# Patient Record
Sex: Female | Born: 1990
Health system: Southern US, Community
[De-identification: ages and names within clinical notes are randomized; demographics above are authoritative.]

## PROBLEM LIST (undated history)

## (undated) DIAGNOSIS — I10 Essential (primary) hypertension: Secondary | ICD-10-CM

## (undated) DIAGNOSIS — K3184 Gastroparesis: Secondary | ICD-10-CM

## (undated) DIAGNOSIS — E1143 Type 2 diabetes mellitus with diabetic autonomic (poly)neuropathy: Secondary | ICD-10-CM

## (undated) HISTORY — DX: Essential (primary) hypertension: I10

---
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
## 2010-05-01 IMAGING — CR DG FINGER INDEX 2+V*L*
1 series · 1 of 1 positions shown · non-contrast
Comparison: None.

CLINICAL DATA: Crush injury to index finger.  Pain and swelling.

LEFT INDEX FINGER 2+V

[view not recorded]
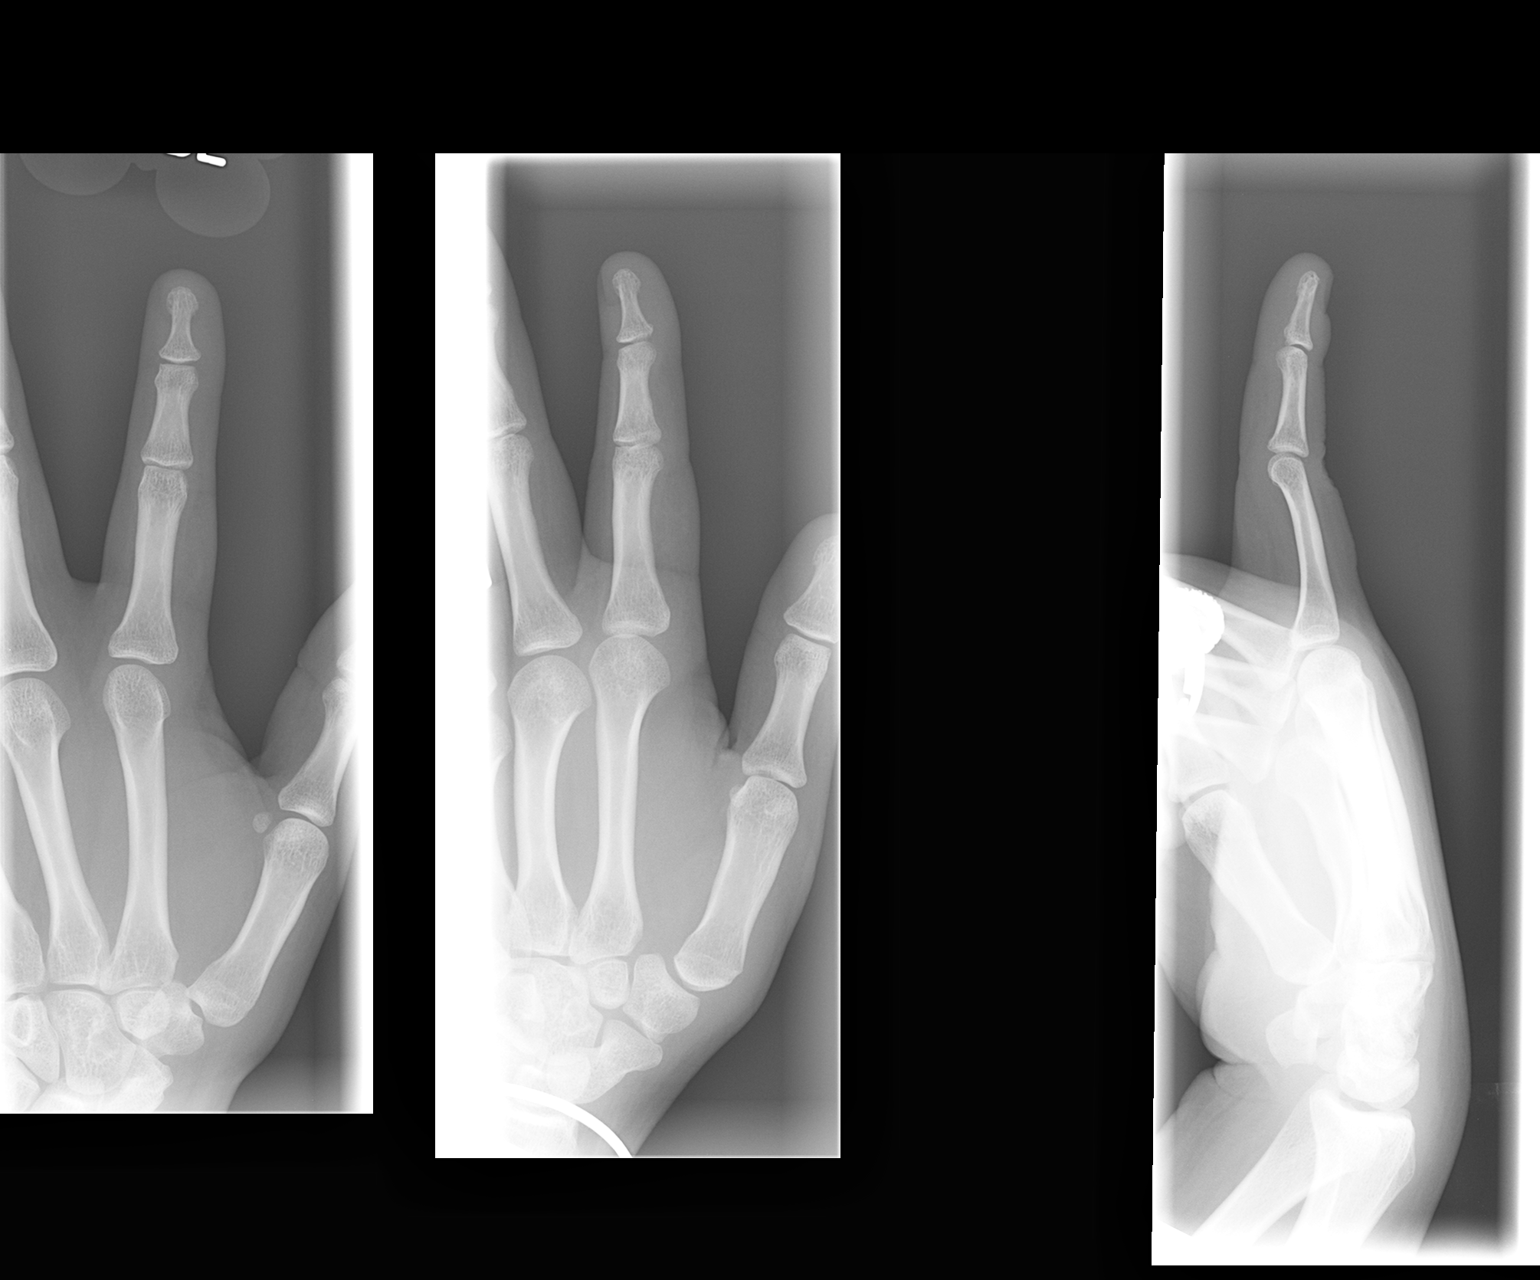

[1 of 1 positions shown; findings below may reference images not displayed]

FINDINGS: There is no evidence of fracture or dislocation.  There
is no evidence of arthropathy or other focal bone abnormality.
Soft tissues are unremarkable.
IMPRESSION: Negative.

## 2010-05-18 NOTE — Progress Notes (Signed)
Summary: phnmsg  Phone Note Call from Patient Call back at Home Phone [phone]   Caller: Patient Summary of Call: pt called to cancel appt for HFU and did not want to reschedule Initial call taken by: De Nurse,  October 14, 2009 1:42 PM

## 2010-07-02 LAB — POCT I-STAT 3, ART BLOOD GAS (G3+)
Acid-base deficit: 5 mmol/L — ABNORMAL HIGH (ref 0.0–2.0)
Acid-base deficit: 20 mmol/L — ABNORMAL HIGH (ref 0.0–2.0)
Bicarbonate: 19.3 mEq/L — ABNORMAL LOW (ref 20.0–24.0)
Bicarbonate: 7.6 mEq/L — ABNORMAL LOW (ref 20.0–24.0)
O2 Saturation: 98 %
O2 Saturation: 49 %
TCO2: 20 mmol/L (ref 0–100)
TCO2: 8 mmol/L (ref 0–100)
pCO2 arterial: 22.8 mmHg — ABNORMAL LOW (ref 35.0–45.0)
pH, Arterial: 7.386 (ref 7.350–7.400)
pO2, Arterial: 34 mmHg — CL (ref 80.0–100.0)

## 2010-07-02 LAB — BASIC METABOLIC PANEL
BUN: 11 mg/dL (ref 6–23)
BUN: 7 mg/dL (ref 6–23)
BUN: 7 mg/dL (ref 6–23)
BUN: 7 mg/dL (ref 6–23)
BUN: 8 mg/dL (ref 6–23)
BUN: 9 mg/dL (ref 6–23)
BUN: 3 mg/dL — ABNORMAL LOW (ref 6–23)
BUN: 4 mg/dL — ABNORMAL LOW (ref 6–23)
BUN: 4 mg/dL — ABNORMAL LOW (ref 6–23)
BUN: 6 mg/dL (ref 6–23)
CO2: 12 mEq/L — ABNORMAL LOW (ref 19–32)
CO2: 20 mEq/L (ref 19–32)
CO2: 22 mEq/L (ref 19–32)
CO2: 22 mEq/L (ref 19–32)
CO2: 13 mEq/L — ABNORMAL LOW (ref 19–32)
CO2: 14 mEq/L — ABNORMAL LOW (ref 19–32)
CO2: 20 mEq/L (ref 19–32)
Calcium: 10.3 mg/dL (ref 8.4–10.5)
Calcium: 10.3 mg/dL (ref 8.4–10.5)
Calcium: 10.4 mg/dL (ref 8.4–10.5)
Calcium: 8.7 mg/dL (ref 8.4–10.5)
Calcium: 9.4 mg/dL (ref 8.4–10.5)
Chloride: 100 mEq/L (ref 96–112)
Chloride: 100 mEq/L (ref 96–112)
Chloride: 102 mEq/L (ref 96–112)
Chloride: 106 mEq/L (ref 96–112)
Chloride: 102 mEq/L (ref 96–112)
Chloride: 104 mEq/L (ref 96–112)
Chloride: 111 mEq/L (ref 96–112)
Chloride: 111 mEq/L (ref 96–112)
Creatinine, Ser: 0.45 mg/dL (ref 0.4–1.2)
Creatinine, Ser: 0.5 mg/dL (ref 0.4–1.2)
Creatinine, Ser: 0.52 mg/dL (ref 0.4–1.2)
Creatinine, Ser: 0.81 mg/dL (ref 0.4–1.2)
Creatinine, Ser: 0.92 mg/dL (ref 0.4–1.2)
Creatinine, Ser: 1.11 mg/dL (ref 0.4–1.2)
Creatinine, Ser: 1.21 mg/dL — ABNORMAL HIGH (ref 0.4–1.2)
Creatinine, Ser: 0.51 mg/dL (ref 0.4–1.2)
Creatinine, Ser: 0.63 mg/dL (ref 0.4–1.2)
Creatinine, Ser: 0.73 mg/dL (ref 0.4–1.2)
Creatinine, Ser: 0.87 mg/dL (ref 0.4–1.2)
GFR calc Af Amer: >60 mL/min (ref >60)
GFR calc Af Amer: >60 mL/min (ref >60)
GFR calc Af Amer: >60 mL/min (ref >60)
GFR calc Af Amer: >60 mL/min (ref >60)
GFR calc Af Amer: >60 mL/min (ref >60)
GFR calc Af Amer: >60 mL/min (ref >60)
GFR calc Af Amer: >60 mL/min (ref >60)
GFR calc Af Amer: >60 mL/min (ref >60)
GFR calc Af Amer: >60 mL/min (ref >60)
GFR calc non Af Amer: >60 mL/min (ref >60)
GFR calc non Af Amer: >60 mL/min (ref >60)
GFR calc non Af Amer: >60 mL/min (ref >60)
GFR calc non Af Amer: >60 mL/min (ref >60)
GFR calc non Af Amer: >60 mL/min (ref >60)
GFR calc non Af Amer: >60 mL/min (ref >60)
GFR calc non Af Amer: 52 mL/min — ABNORMAL LOW (ref >60)
GFR calc non Af Amer: >60 mL/min (ref >60)
Glucose, Bld: 153 mg/dL — ABNORMAL HIGH (ref 70–99)
Glucose, Bld: 177 mg/dL — ABNORMAL HIGH (ref 70–99)
Glucose, Bld: 196 mg/dL — ABNORMAL HIGH (ref 70–99)
Glucose, Bld: 255 mg/dL — ABNORMAL HIGH (ref 70–99)
Glucose, Bld: 322 mg/dL — ABNORMAL HIGH (ref 70–99)
Glucose, Bld: 365 mg/dL — ABNORMAL HIGH (ref 70–99)
Glucose, Bld: 372 mg/dL — ABNORMAL HIGH (ref 70–99)
Glucose, Bld: 406 mg/dL — ABNORMAL HIGH (ref 70–99)
Glucose, Bld: 505 mg/dL — ABNORMAL HIGH (ref 70–99)
Glucose, Bld: 198 mg/dL — ABNORMAL HIGH (ref 70–99)
Glucose, Bld: 235 mg/dL — ABNORMAL HIGH (ref 70–99)
Glucose, Bld: 257 mg/dL — ABNORMAL HIGH (ref 70–99)
Glucose, Bld: 73 mg/dL (ref 70–99)
Potassium: 3.3 mEq/L — ABNORMAL LOW (ref 3.5–5.1)
Potassium: 3.9 mEq/L (ref 3.5–5.1)
Potassium: 4.1 mEq/L (ref 3.5–5.1)
Potassium: 4.8 mEq/L (ref 3.5–5.1)
Potassium: 4.7 mEq/L (ref 3.5–5.1)
Sodium: 129 mEq/L — ABNORMAL LOW (ref 135–145)
Sodium: 130 mEq/L — ABNORMAL LOW (ref 135–145)
Sodium: 131 mEq/L — ABNORMAL LOW (ref 135–145)
Sodium: 132 mEq/L — ABNORMAL LOW (ref 135–145)
Sodium: 134 mEq/L — ABNORMAL LOW (ref 135–145)
Sodium: 132 mEq/L — ABNORMAL LOW (ref 135–145)

## 2010-07-02 LAB — POCT I-STAT EG7
Acid-base deficit: 13 mmol/L — ABNORMAL HIGH (ref 0.0–2.0)
Acid-base deficit: 2 mmol/L (ref 0.0–2.0)
Bicarbonate: 12.7 mEq/L — ABNORMAL LOW (ref 20.0–24.0)
Bicarbonate: 21.2 mEq/L (ref 20.0–24.0)
HCT: 49 % — ABNORMAL HIGH (ref 36.0–46.0)
Hemoglobin: 12.6 g/dL (ref 12.0–15.0)
Hemoglobin: 12.6 g/dL (ref 12.0–15.0)
O2 Saturation: 34 %
O2 Saturation: 54 %
O2 Saturation: 95 %
Patient temperature: 36.6
Potassium: 3.3 mEq/L — ABNORMAL LOW (ref 3.5–5.1)
Potassium: 3.5 mEq/L (ref 3.5–5.1)
Sodium: 134 mEq/L — ABNORMAL LOW (ref 135–145)
Sodium: 136 mEq/L (ref 135–145)
Sodium: 136 mEq/L (ref 135–145)
TCO2: 14 mmol/L (ref 0–100)
TCO2: 17 mmol/L (ref 0–100)
TCO2: 21 mmol/L (ref 0–100)
TCO2: 22 mmol/L (ref 0–100)
pCO2, Ven: 30 mmHg — ABNORMAL LOW (ref 45.0–50.0)
pH, Ven: 7.455 — ABNORMAL HIGH (ref 7.250–7.300)
pH, Ven: 7.486 — ABNORMAL HIGH (ref 7.250–7.300)
pO2, Ven: 24 mmHg — CL (ref 30.0–45.0)
pO2, Ven: 68 mmHg — ABNORMAL HIGH (ref 30.0–45.0)

## 2010-07-02 LAB — BLOOD GAS, ARTERIAL
Acid-base deficit: 2.8 mmol/L — ABNORMAL HIGH (ref 0.0–2.0)
FIO2: 0.21 %
O2 Saturation: 98.4 %
Patient temperature: 98.6
pCO2 arterial: 34.8 mmHg — ABNORMAL LOW (ref 35.0–45.0)

## 2010-07-02 LAB — GLUCOSE, CAPILLARY
Glucose-Capillary: 142 mg/dL — ABNORMAL HIGH (ref 70–99)
Glucose-Capillary: 145 mg/dL — ABNORMAL HIGH (ref 70–99)
Glucose-Capillary: 171 mg/dL — ABNORMAL HIGH (ref 70–99)
Glucose-Capillary: 191 mg/dL — ABNORMAL HIGH (ref 70–99)
Glucose-Capillary: 219 mg/dL — ABNORMAL HIGH (ref 70–99)
Glucose-Capillary: 223 mg/dL — ABNORMAL HIGH (ref 70–99)
Glucose-Capillary: 234 mg/dL — ABNORMAL HIGH (ref 70–99)
Glucose-Capillary: 236 mg/dL — ABNORMAL HIGH (ref 70–99)
Glucose-Capillary: 236 mg/dL — ABNORMAL HIGH (ref 70–99)
Glucose-Capillary: 244 mg/dL — ABNORMAL HIGH (ref 70–99)
Glucose-Capillary: 255 mg/dL — ABNORMAL HIGH (ref 70–99)
Glucose-Capillary: 259 mg/dL — ABNORMAL HIGH (ref 70–99)
Glucose-Capillary: 279 mg/dL — ABNORMAL HIGH (ref 70–99)
Glucose-Capillary: 279 mg/dL — ABNORMAL HIGH (ref 70–99)
Glucose-Capillary: 288 mg/dL — ABNORMAL HIGH (ref 70–99)
Glucose-Capillary: 337 mg/dL — ABNORMAL HIGH (ref 70–99)
Glucose-Capillary: 348 mg/dL — ABNORMAL HIGH (ref 70–99)
Glucose-Capillary: 468 mg/dL — ABNORMAL HIGH (ref 70–99)
Glucose-Capillary: 545 mg/dL — ABNORMAL HIGH (ref 70–99)
Glucose-Capillary: >600 mg/dL (ref 70–99)
Glucose-Capillary: 104 mg/dL — ABNORMAL HIGH (ref 70–99)
Glucose-Capillary: 108 mg/dL — ABNORMAL HIGH (ref 70–99)
Glucose-Capillary: 109 mg/dL — ABNORMAL HIGH (ref 70–99)
Glucose-Capillary: 126 mg/dL — ABNORMAL HIGH (ref 70–99)
Glucose-Capillary: 132 mg/dL — ABNORMAL HIGH (ref 70–99)
Glucose-Capillary: 157 mg/dL — ABNORMAL HIGH (ref 70–99)
Glucose-Capillary: 169 mg/dL — ABNORMAL HIGH (ref 70–99)
Glucose-Capillary: 180 mg/dL — ABNORMAL HIGH (ref 70–99)
Glucose-Capillary: 196 mg/dL — ABNORMAL HIGH (ref 70–99)
Glucose-Capillary: 197 mg/dL — ABNORMAL HIGH (ref 70–99)
Glucose-Capillary: 198 mg/dL — ABNORMAL HIGH (ref 70–99)
Glucose-Capillary: 212 mg/dL — ABNORMAL HIGH (ref 70–99)
Glucose-Capillary: 212 mg/dL — ABNORMAL HIGH (ref 70–99)
Glucose-Capillary: 228 mg/dL — ABNORMAL HIGH (ref 70–99)
Glucose-Capillary: 242 mg/dL — ABNORMAL HIGH (ref 70–99)
Glucose-Capillary: 251 mg/dL — ABNORMAL HIGH (ref 70–99)
Glucose-Capillary: 279 mg/dL — ABNORMAL HIGH (ref 70–99)
Glucose-Capillary: 62 mg/dL — ABNORMAL LOW (ref 70–99)
Glucose-Capillary: 87 mg/dL (ref 70–99)
Glucose-Capillary: 87 mg/dL (ref 70–99)
Glucose-Capillary: 91 mg/dL (ref 70–99)

## 2010-07-02 LAB — URINE MICROSCOPIC-ADD ON

## 2010-07-02 LAB — DIFFERENTIAL
Basophils Absolute: 0 10*3/uL (ref 0.0–0.1)
Basophils Relative: 0 % (ref 0–1)
Eosinophils Absolute: 0 10*3/uL (ref 0.0–0.7)
Eosinophils Relative: 0 % (ref 0–5)
Lymphocytes Relative: 20 % (ref 12–46)
Lymphocytes Relative: 9 % — ABNORMAL LOW (ref 12–46)
Lymphocytes Relative: 9 % — ABNORMAL LOW (ref 12–46)
Lymphs Abs: 2 10*3/uL (ref 0.7–4.0)
Lymphs Abs: 2.2 10*3/uL (ref 0.7–4.0)
Lymphs Abs: 1.8 10*3/uL (ref 0.7–4.0)
Monocytes Relative: 5 % (ref 3–12)
Monocytes Relative: 2 % — ABNORMAL LOW (ref 3–12)
Neutro Abs: 8.8 10*3/uL — ABNORMAL HIGH (ref 1.7–7.7)
Neutrophils Relative %: 73 % (ref 43–77)
Neutrophils Relative %: 77 % (ref 43–77)

## 2010-07-02 LAB — POCT I-STAT, CHEM 8
BUN: 29 mg/dL — ABNORMAL HIGH (ref 6–23)
Calcium, Ion: 1.2 mmol/L (ref 1.12–1.32)
Glucose, Bld: 690 mg/dL (ref 70–99)
Hemoglobin: 17.3 g/dL — ABNORMAL HIGH (ref 12.0–15.0)
TCO2: 7 mmol/L (ref 0–100)

## 2010-07-02 LAB — CBC
HCT: 46.1 % — ABNORMAL HIGH (ref 36.0–46.0)
HCT: 48.7 % — ABNORMAL HIGH (ref 36.0–46.0)
Hemoglobin: 15.1 g/dL — ABNORMAL HIGH (ref 12.0–15.0)
MCHC: 32.7 g/dL (ref 30.0–36.0)
MCHC: 33.9 g/dL (ref 30.0–36.0)
MCV: 96 fL (ref 78.0–100.0)
MCV: 93.4 fL (ref 78.0–100.0)
Platelets: 362 10*3/uL (ref 150–400)
Platelets: 379 10*3/uL (ref 150–400)
RBC: 4.42 MIL/uL (ref 3.87–5.11)
RBC: 4.81 MIL/uL (ref 3.87–5.11)
RBC: 5.21 MIL/uL — ABNORMAL HIGH (ref 3.87–5.11)
RDW: 15.2 % (ref 11.5–15.5)
RDW: 15.7 % — ABNORMAL HIGH (ref 11.5–15.5)
WBC: 9.9 10*3/uL (ref 4.0–10.5)
WBC: 19 10*3/uL — ABNORMAL HIGH (ref 4.0–10.5)

## 2010-07-02 LAB — MAGNESIUM: Magnesium: 2.3 mg/dL (ref 1.5–2.5)

## 2010-07-02 LAB — URINALYSIS, ROUTINE W REFLEX MICROSCOPIC
Bilirubin Urine: NEGATIVE
Glucose, UA: >1000 mg/dL — AB
Ketones, ur: 40 mg/dL — AB
Nitrite: NEGATIVE
Specific Gravity, Urine: 1.029 (ref 1.005–1.030)
Specific Gravity, Urine: 1.024 (ref 1.005–1.030)
Urobilinogen, UA: 0.2 mg/dL (ref 0.0–1.0)
pH: 5 (ref 5.0–8.0)

## 2010-07-02 LAB — MICROALBUMIN, URINE: Microalb, Ur: 0.5 mg/dL (ref 0.00–1.89)

## 2010-07-02 LAB — HEMOGLOBIN A1C: Mean Plasma Glucose: 312 mg/dL — ABNORMAL HIGH (ref <117)

## 2010-07-02 LAB — KETONES, URINE
Ketones, ur: NEGATIVE mg/dL
Ketones, ur: NEGATIVE mg/dL

## 2010-07-02 LAB — POCT PREGNANCY, URINE
Preg Test, Ur: NEGATIVE
Preg Test, Ur: NEGATIVE

## 2010-07-02 LAB — POCT I-STAT 3, VENOUS BLOOD GAS (G3P V)
Bicarbonate: 8.4 mEq/L — ABNORMAL LOW (ref 20.0–24.0)
pO2, Ven: 42 mmHg (ref 30.0–45.0)

## 2010-07-02 LAB — KETONES, QUALITATIVE

## 2010-07-03 LAB — GLUCOSE, CAPILLARY
Glucose-Capillary: 121 mg/dL — ABNORMAL HIGH (ref 70–99)
Glucose-Capillary: 123 mg/dL — ABNORMAL HIGH (ref 70–99)
Glucose-Capillary: 164 mg/dL — ABNORMAL HIGH (ref 70–99)
Glucose-Capillary: 185 mg/dL — ABNORMAL HIGH (ref 70–99)
Glucose-Capillary: 220 mg/dL — ABNORMAL HIGH (ref 70–99)
Glucose-Capillary: 247 mg/dL — ABNORMAL HIGH (ref 70–99)
Glucose-Capillary: 271 mg/dL — ABNORMAL HIGH (ref 70–99)
Glucose-Capillary: 93 mg/dL (ref 70–99)
Glucose-Capillary: >600 mg/dL (ref 70–99)

## 2010-07-03 LAB — DIFFERENTIAL
Basophils Relative: 0 % (ref 0–1)
Eosinophils Absolute: 0.2 10*3/uL (ref 0.0–0.7)
Eosinophils Relative: 4 % (ref 0–5)
Lymphs Abs: 1.4 10*3/uL (ref 0.7–4.0)
Lymphs Abs: 2.7 10*3/uL (ref 0.7–4.0)
Monocytes Absolute: 0.4 10*3/uL (ref 0.1–1.0)
Monocytes Relative: 4 % (ref 3–12)
Monocytes Relative: 7 % (ref 3–12)
Neutro Abs: 9 10*3/uL — ABNORMAL HIGH (ref 1.7–7.7)
Neutrophils Relative %: 40 % — ABNORMAL LOW (ref 43–77)
Neutrophils Relative %: 83 % — ABNORMAL HIGH (ref 43–77)

## 2010-07-03 LAB — CBC
HCT: 39.3 % (ref 36.0–46.0)
Hemoglobin: 13 g/dL (ref 12.0–15.0)
Hemoglobin: 13.9 g/dL (ref 12.0–15.0)
MCHC: 32.3 g/dL (ref 30.0–36.0)
MCV: 91.8 fL (ref 78.0–100.0)
RBC: 4.28 MIL/uL (ref 3.87–5.11)
RBC: 4.57 MIL/uL (ref 3.87–5.11)
WBC: 10.9 10*3/uL — ABNORMAL HIGH (ref 4.0–10.5)
WBC: 5.5 10*3/uL (ref 4.0–10.5)

## 2010-07-03 LAB — LIPID PANEL
LDL Cholesterol: 129 mg/dL — ABNORMAL HIGH (ref 0–109)
Total CHOL/HDL Ratio: 3.2 RATIO
VLDL: 23 mg/dL (ref 0–40)

## 2010-07-03 LAB — BASIC METABOLIC PANEL
BUN: 5 mg/dL — ABNORMAL LOW (ref 6–23)
BUN: 9 mg/dL (ref 6–23)
CO2: 19 mEq/L (ref 19–32)
CO2: 20 mEq/L (ref 19–32)
CO2: 20 mEq/L (ref 19–32)
Calcium: 9 mg/dL (ref 8.4–10.5)
Calcium: 9.2 mg/dL (ref 8.4–10.5)
Chloride: 104 mEq/L (ref 96–112)
Chloride: 104 mEq/L (ref 96–112)
Chloride: 108 mEq/L (ref 96–112)
Chloride: 99 mEq/L (ref 96–112)
Creatinine, Ser: 0.56 mg/dL (ref 0.4–1.2)
Creatinine, Ser: 0.58 mg/dL (ref 0.4–1.2)
Creatinine, Ser: 0.67 mg/dL (ref 0.4–1.2)
Creatinine, Ser: 1.08 mg/dL (ref 0.4–1.2)
GFR calc Af Amer: >60 mL/min (ref >60)
GFR calc Af Amer: >60 mL/min (ref >60)
GFR calc Af Amer: >60 mL/min (ref >60)
GFR calc Af Amer: >60 mL/min (ref >60)
GFR calc non Af Amer: >60 mL/min (ref >60)
GFR calc non Af Amer: >60 mL/min (ref >60)
Glucose, Bld: 168 mg/dL — ABNORMAL HIGH (ref 70–99)
Glucose, Bld: 389 mg/dL — ABNORMAL HIGH (ref 70–99)
Potassium: 3.3 mEq/L — ABNORMAL LOW (ref 3.5–5.1)
Potassium: 3.7 mEq/L (ref 3.5–5.1)
Potassium: 4.6 mEq/L (ref 3.5–5.1)
Sodium: 133 mEq/L — ABNORMAL LOW (ref 135–145)
Sodium: 134 mEq/L — ABNORMAL LOW (ref 135–145)
Sodium: 135 mEq/L (ref 135–145)
Sodium: 140 mEq/L (ref 135–145)

## 2010-07-03 LAB — HEMOGLOBIN A1C: Mean Plasma Glucose: 301 mg/dL — ABNORMAL HIGH (ref <117)

## 2010-07-03 LAB — URINALYSIS, ROUTINE W REFLEX MICROSCOPIC
Bilirubin Urine: NEGATIVE
Glucose, UA: >1000 mg/dL — AB
Ketones, ur: >80 mg/dL — AB
Protein, ur: NEGATIVE mg/dL
Urobilinogen, UA: 0.2 mg/dL (ref 0.0–1.0)

## 2010-07-03 LAB — MRSA PCR SCREENING: MRSA by PCR: NEGATIVE

## 2010-07-03 LAB — URINE MICROSCOPIC-ADD ON

## 2010-07-19 LAB — DIFFERENTIAL
Basophils Absolute: 0.1 10*3/uL (ref 0.0–0.1)
Basophils Relative: 1 % (ref 0–1)
Neutro Abs: 6.4 10*3/uL (ref 1.7–8.0)
Neutrophils Relative %: 66 % (ref 43–71)

## 2010-07-19 LAB — COMPREHENSIVE METABOLIC PANEL
Alkaline Phosphatase: 118 U/L (ref 47–119)
BUN: 13 mg/dL (ref 6–23)
CO2: 19 mEq/L (ref 19–32)
Chloride: 100 mEq/L (ref 96–112)
Glucose, Bld: 366 mg/dL — ABNORMAL HIGH (ref 70–99)
Potassium: 3.9 mEq/L (ref 3.5–5.1)
Total Bilirubin: 0.9 mg/dL (ref 0.3–1.2)
Total Protein: 7.3 g/dL (ref 6.0–8.3)

## 2010-07-19 LAB — GLUCOSE, CAPILLARY: Glucose-Capillary: 308 mg/dL — ABNORMAL HIGH (ref 70–99)

## 2010-07-19 LAB — POCT I-STAT 3, VENOUS BLOOD GAS (G3P V)
Bicarbonate: 20.7 mEq/L (ref 20.0–24.0)
O2 Saturation: 95 %
TCO2: 22 mmol/L (ref 0–100)
pCO2, Ven: 32.8 mmHg — ABNORMAL LOW (ref 45.0–50.0)

## 2010-07-19 LAB — URINALYSIS, ROUTINE W REFLEX MICROSCOPIC
Nitrite: NEGATIVE
Protein, ur: NEGATIVE mg/dL
Specific Gravity, Urine: 1.026 (ref 1.005–1.030)
Urobilinogen, UA: 0.2 mg/dL (ref 0.0–1.0)

## 2010-07-19 LAB — CBC
HCT: 39 % (ref 36.0–49.0)
Hemoglobin: 13.3 g/dL (ref 12.0–16.0)
RDW: 13.6 % (ref 11.4–15.5)

## 2010-07-19 LAB — URINE MICROSCOPIC-ADD ON

## 2010-09-01 NOTE — Discharge Summary (Signed)
[name], [name]                         ACCOUNT NO.:  [id number]   MEDICAL RECORD NO.:  [id number]                   PATIENT TYPE:  INP   LOCATION:  6121                                 FACILITY:  [facility]   PHYSICIAN:  Nani Gasser, M.D.            DATE OF BIRTH:  07/05/1990   DATE OF ADMISSION:  07/01/2002  DATE OF DISCHARGE:  07/06/2002                                 DISCHARGE SUMMARY   DISCHARGE DIAGNOSES:  1. Diabetic ketoacidosis.  2. Diabetes mellitus, type 1.  3. Dehydration.  4. Constipation.  5. Asthma.   DISCHARGE MEDICATIONS:  1. Lantus 30 units at bedtime.  2. Sliding scale:  CBG 150 to 200 is 2 units, 200 to 250 is 4 units, 250 to     300 is 6 units, 300 to 350 is 8 units, 350 to 400 is 7 units and greater     than 400 is 12 units.  3. Albuterol -- continue as before.  4. MiraLax 17 g dissolved in 8 ounces of water p.o. daily p.r.n. for     constipation.   SPECIAL DISCHARGE INSTRUCTIONS:  She also has instructions to drink plenty  of fluids at home and increase the fiber in her diet.  She is also to eat a  snack after PE class at school to help with low CBGs and also special  instructions include if first morning glucose is less than 60, then she is  to supposed to decrease her evening Lantus to 28 units.   HOSPITAL COURSE:  The patient is an 20 year old African American female with  a history of diabetes mellitus, type 1, who came in with vomiting and  complaining of stomach pain and hyperglycemia.  The patient reported feeling  sick to her stomach  at school and started vomiting x4, which she describes  as bilious.  The patient came in and was admitted for diabetic ketoacidosis.  The following problems were addressed during admission:   #1 - DIABETIC KETOACIDOSIS:  The patient had an i-STAT showing a blood pH of  7.121, PCO2 of 28 and a bicarb of 9, a sodium of 137, a potassium of 5.5 and  a glucose of 450.  BUN was 12 and creatinine was 1.2.   The patient did not  have any other signs or symptoms of infection at that time, though we did do  a CBC with a white count of 15.7, a hemoglobin of 6.1, hematocrit of 47.4  and a platelet count of 309,000.  She also had a negative hCG and her UA  showed glucose and greater than 80 ketones but no signs of urinary tract  infection.  The patient also had a lipase which was normal.  The patient was  admitted to the TICU for IV fluid rehydration and placed on an insulin drip.  The patient did well overnight and then was restarted on subcu injection of  insulin and transferred  to the floor.  The patient continued to have  elevated CBGs, once back on her regular subcut regimen of 17 units of Lantus  and a less strict sliding scale, thus, these were readjusted multiple times,  with the final change as above in the medication list.  The patient was  still running 200 and 300, even after regimen changes.  It was felt that the  patient could deal with these CBGs as an outpatient and keep in close  contact with the endocrinologist at [facility].  The patient has been  instructed to keep her next followup appointment for managing her diabetes.   #2 - DIABETES MELLITUS:  The patient had had hemoglobin A1cs of 7 up until  this last November, when her regimen was changed in preparation for possibly  getting an insulin pump.  Since that time, mom reports difficulty with  glucose control.  On admission, hemoglobin A1c was 13.4.  The patient  appears to be fairly compliant.  She also has a good understanding of carb-  counting and she and her mother report not missing any insulin doses.  The  patient will continue to need adjustments, as she is currently entering  puberty and may have higher requirements.  The patient is currently using a  sliding scale with insulin and the patient covers 10 g of carbohydrates with  1 unit of insulin; she is also on Lantus at night.   #3 - DEHYDRATION:  The patient was  rehydrated with IV fluids.   #4 - ASTHMA:  This is a chronic problem and the patient is on albuterol  p.r.n. and had no complications during admission.   #5 - CONSTIPATION:  The patient was started on a regimen of MiraLax and  Senokot here until she had several bowel movements.  The patient will be  given a prescription for MiraLax to use at home for intermittent  constipation.                                               Nani Gasser, M.D.    CM/MEDQ  D:  07/06/2002  T:  07/07/2002  Job:  [id number]   cc:   Lennie Hummer, Kendell Bane, [place] Dr. Epimenio Sarin, [place] Dr. Caron Presume

## 2011-04-29 ENCOUNTER — Encounter (HOSPITAL_COMMUNITY): Payer: Self-pay

## 2011-04-29 ENCOUNTER — Inpatient Hospital Stay (HOSPITAL_COMMUNITY)
Admission: EM | Admit: 2011-04-29 | Discharge: 2011-05-01 | DRG: 639 | Disposition: A | Discharge status: Home / Self Care | Payer: Medicaid Other | Attending: Internal Medicine | Admitting: Internal Medicine

## 2011-04-29 ENCOUNTER — Emergency Department (INDEPENDENT_AMBULATORY_CARE_PROVIDER_SITE_OTHER)
Admission: EM | Admit: 2011-04-29 | Discharge: 2011-04-29 | Disposition: A | Discharge status: Other Acute Inpatient Hospital | Payer: Self-pay | Source: Home / Self Care | Attending: Emergency Medicine | Admitting: Emergency Medicine

## 2011-04-29 ENCOUNTER — Emergency Department (HOSPITAL_COMMUNITY): Payer: Medicaid Other

## 2011-04-29 DIAGNOSIS — E1110 Type 2 diabetes mellitus with ketoacidosis without coma: Secondary | ICD-10-CM

## 2011-04-29 DIAGNOSIS — Z91199 Patient's noncompliance with other medical treatment and regimen due to unspecified reason: Secondary | ICD-10-CM

## 2011-04-29 DIAGNOSIS — Z9641 Presence of insulin pump (external) (internal): Secondary | ICD-10-CM

## 2011-04-29 DIAGNOSIS — J45909 Unspecified asthma, uncomplicated: Secondary | ICD-10-CM | POA: Diagnosis present

## 2011-04-29 DIAGNOSIS — Z23 Encounter for immunization: Secondary | ICD-10-CM

## 2011-04-29 DIAGNOSIS — E1010 Type 1 diabetes mellitus with ketoacidosis without coma: Principal | ICD-10-CM | POA: Diagnosis present

## 2011-04-29 DIAGNOSIS — Z9119 Patient's noncompliance with other medical treatment and regimen: Secondary | ICD-10-CM

## 2011-04-29 LAB — GLUCOSE, CAPILLARY
Glucose-Capillary: 522 mg/dL — ABNORMAL HIGH (ref 70–99)
Glucose-Capillary: 268 mg/dL — ABNORMAL HIGH (ref 70–99)
Glucose-Capillary: 216 mg/dL — ABNORMAL HIGH (ref 70–99)

## 2011-04-29 LAB — URINALYSIS, ROUTINE W REFLEX MICROSCOPIC
Bilirubin Urine: NEGATIVE
Glucose, UA: >1000 mg/dL — AB
Ketones, ur: >80 mg/dL — AB
pH: 5 (ref 5.0–8.0)

## 2011-04-29 LAB — DIFFERENTIAL
Basophils Relative: 0 % (ref 0–1)
Lymphocytes Relative: 15 % (ref 12–46)
Lymphs Abs: 1.8 10*3/uL (ref 0.7–4.0)
Monocytes Absolute: 0.5 10*3/uL (ref 0.1–1.0)
Monocytes Relative: 4 % (ref 3–12)
Neutro Abs: 9.9 10*3/uL — ABNORMAL HIGH (ref 1.7–7.7)
Neutrophils Relative %: 81 % — ABNORMAL HIGH (ref 43–77)

## 2011-04-29 LAB — CBC
HCT: 38.5 % (ref 36.0–46.0)
Hemoglobin: 12.9 g/dL (ref 12.0–15.0)
MCHC: 33.5 g/dL (ref 30.0–36.0)
RBC: 4.43 MIL/uL (ref 3.87–5.11)

## 2011-04-29 LAB — BASIC METABOLIC PANEL
BUN: 12 mg/dL (ref 6–23)
CO2: 11 mEq/L — ABNORMAL LOW (ref 19–32)
Chloride: 97 mEq/L (ref 96–112)
Creatinine, Ser: 0.58 mg/dL (ref 0.50–1.10)
Glucose, Bld: 564 mg/dL (ref 70–99)
Potassium: 4.5 mEq/L (ref 3.5–5.1)

## 2011-04-29 LAB — POCT I-STAT, CHEM 8
BUN: 12 mg/dL (ref 6–23)
Creatinine, Ser: 0.7 mg/dL (ref 0.50–1.10)
Glucose, Bld: 677 mg/dL (ref 70–99)
Hemoglobin: 15 g/dL (ref 12.0–15.0)
Potassium: 4.4 mEq/L (ref 3.5–5.1)

## 2011-04-29 LAB — POCT I-STAT 3, ART BLOOD GAS (G3+)
O2 Saturation: 98 %
TCO2: 9 mmol/L (ref 0–100)
pCO2 arterial: 21.5 mmHg — ABNORMAL LOW (ref 35.0–45.0)

## 2011-04-29 MED ORDER — SODIUM CHLORIDE 0.9 % IV SOLN: INTRAVENOUS | Status: DC

## 2011-04-29 MED ORDER — INFLUENZA VIRUS VACC SPLIT PF IM SUSP
0.5000 mL | INTRAMUSCULAR | Status: AC
  Administered 2011-04-30: 0.5 mL via INTRAMUSCULAR
  Filled 2011-04-29: qty 0.5

## 2011-04-29 MED ORDER — INSULIN REGULAR BOLUS VIA INFUSION
0.0000 [IU] | Freq: Three times a day (TID) | INTRAVENOUS | Status: DC
  Administered 2011-04-30: 1 [IU] via INTRAVENOUS
  Administered 2011-04-30: 4.9 [IU] via INTRAVENOUS
  Filled 2011-04-29 (×7): qty 10

## 2011-04-29 MED ORDER — SODIUM CHLORIDE 0.9 % IV SOLN
INTRAVENOUS | Status: DC
  Administered 2011-05-01: 1.7 [IU]/h via INTRAVENOUS

## 2011-04-29 MED ORDER — ENOXAPARIN SODIUM 40 MG/0.4ML ~~LOC~~ SOLN
40.0000 mg | SUBCUTANEOUS | Status: DC
  Administered 2011-04-30 – 2011-05-01 (×2): 40 mg via SUBCUTANEOUS
  Filled 2011-04-29 (×3): qty 0.4

## 2011-04-29 MED ORDER — DEXTROSE 50 % IV SOLN: 25.0000 mL | INTRAVENOUS | Status: DC | PRN

## 2011-04-29 MED ORDER — SODIUM CHLORIDE 0.9 % IV SOLN
INTRAVENOUS | Status: DC
  Administered 2011-04-29: 19:00:00 via INTRAVENOUS

## 2011-04-29 MED ORDER — POTASSIUM CHLORIDE 10 MEQ/100ML IV SOLN: 10.0000 meq | INTRAVENOUS | Status: AC

## 2011-04-29 MED ORDER — PNEUMOCOCCAL VAC POLYVALENT 25 MCG/0.5ML IJ INJ
0.5000 mL | INJECTION | INTRAMUSCULAR | Status: AC
  Administered 2011-04-30: 0.5 mL via INTRAMUSCULAR
  Filled 2011-04-29: qty 0.5

## 2011-04-29 MED ORDER — SODIUM CHLORIDE 0.9 % IV BOLUS (SEPSIS)
1000.0000 mL | Freq: Once | INTRAVENOUS | Status: AC
  Administered 2011-04-29: 1000 mL via INTRAVENOUS

## 2011-04-29 MED ORDER — DEXTROSE-NACL 5-0.45 % IV SOLN
INTRAVENOUS | Status: DC
  Administered 2011-04-30: 1000 mL via INTRAVENOUS
  Administered 2011-04-30: 150 mL via INTRAVENOUS
  Administered 2011-04-30: 14:00:00 via INTRAVENOUS

## 2011-04-29 MED ORDER — ONDANSETRON HCL 4 MG/2ML IJ SOLN
4.0000 mg | Freq: Once | INTRAMUSCULAR | Status: AC
  Administered 2011-04-29: 4 mg via INTRAVENOUS
  Filled 2011-04-29: qty 2

## 2011-04-29 MED ORDER — SODIUM CHLORIDE 0.9 % IV SOLN
INTRAVENOUS | Status: AC
  Administered 2011-04-29: 4.6 [IU]/h via INTRAVENOUS
  Filled 2011-04-29: qty 1

## 2011-04-29 MED ORDER — SODIUM CHLORIDE 0.9 % IV SOLN
INTRAVENOUS | Status: AC
  Filled 2011-04-29: qty 1

## 2011-04-29 MED ORDER — SODIUM CHLORIDE 0.9 % IV SOLN: Freq: Once | INTRAVENOUS | Status: DC

## 2011-04-29 MED ORDER — DEXTROSE-NACL 5-0.45 % IV SOLN: INTRAVENOUS | Status: DC

## 2011-04-29 MED ORDER — DEXTROSE 50 % IV SOLN
25.0000 mL | INTRAVENOUS | Status: DC | PRN
  Filled 2011-04-29: qty 50

## 2011-04-29 NOTE — ED Notes (Signed)
Pt vomiting at bedside.

## 2011-04-29 NOTE — ED Provider Notes (Signed)
History     CSN: [id number]  Arrival date & time 04/29/11  1718   First MD Initiated Contact with Patient 04/29/11 1724      Chief Complaint  Patient presents with  . Blood Sugar Problem    Pt ran out of insulin, went to UC today, found to have BG of 677.  Pt was having frequent urination, extreme thirst.  Pt states this morning her BG was 160.    (Consider location/radiation/quality/duration/timing/severity/associated sxs/prior treatment) HPI History provided by pt.   Pt is a type I diabetic on Lantus qhs and Apidra sliding scale w/ meals.  Compliant w/ medications.  Ran out of insulin yesterday.  Went to pharmacy and found out that her father had dropped her from insurance plan and she is now unable to get her medications.  BG was 160 when checked at home this morning but as day progressed, she became increasingly more sluggish, nauseous and thirsty.  Has had frequent urination as well.  Went to [facility], BG in 600s and transferred ED.  Pt denies having any recent illnesses.  Per prior chart, most recent admission for DKA 09/2009.  Hgb A1c 12.1 at that time.    Past Medical History  Diagnosis Date  . Diabetes mellitus   . Asthma     History reviewed. No pertinent past surgical history.  History reviewed. No pertinent family history.  History  Substance Use Topics  . Smoking status: Passive Smoker  . Smokeless tobacco: Not on file  . Alcohol Use: No    OB History    Grav Para Term Preterm Abortions TAB SAB Ect Mult Living                  Review of Systems  All other systems reviewed and are negative.    Allergies  Review of patient's allergies indicates no known allergies.  Home Medications   Current Outpatient Rx  Name Route Sig Dispense Refill  . INSULIN GLARGINE 100 UNIT/ML [place] SOLN Subcutaneous Inject 40 Units into the skin at bedtime.    . INSULIN GLULISINE 100 UNIT/ML [place] SOLN Subcutaneous Inject 80 Units into the skin 3 (three) times daily before meals. Per  sliding scale      BP 121/57  Pulse 110  Temp(Src) 98.5 F (36.9 C) (Oral)  Resp 22  Ht 5\' 4"  (1.626 m)  Wt 143 lb (64.864 kg)  BMI 24.55 kg/m2  SpO2 100%  Physical Exam  Nursing note and vitals reviewed. Constitutional: She is oriented to person, place, and time. She appears well-developed and well-nourished. No distress.  HENT:  Head: Normocephalic and atraumatic.  Eyes:       Normal appearance  Neck: Normal range of motion.  Cardiovascular: Normal rate and regular rhythm.   Pulmonary/Chest: Effort normal and breath sounds normal.  Abdominal: Soft. Bowel sounds are normal. She exhibits no distension. There is no tenderness.  Neurological: She is alert and oriented to person, place, and time.  Skin: Skin is warm and dry. No rash noted.  Psychiatric: She has a normal mood and affect. Her behavior is normal.    ED Course  Procedures (including critical care time)  Labs Reviewed  GLUCOSE, CAPILLARY - Abnormal; Notable for the following:    Glucose-Capillary 596 (*)    All other components within normal limits  CBC - Abnormal; Notable for the following:    WBC 12.2 (*)    All other components within normal limits  DIFFERENTIAL - Abnormal; Notable for the  following:    Neutrophils Relative 81 (*)    Neutro Abs 9.9 (*)    All other components within normal limits  BASIC METABOLIC PANEL - Abnormal; Notable for the following:    Sodium 134 (*)    CO2 11 (*)    Glucose, Bld 564 (*)    All other components within normal limits  URINALYSIS, ROUTINE W REFLEX MICROSCOPIC - Abnormal; Notable for the following:    Glucose, UA >1000 (*)    Ketones, ur >80 (*)    All other components within normal limits  URINE MICROSCOPIC-ADD ON - Abnormal; Notable for the following:    Squamous Epithelial / LPF FEW (*)    All other components within normal limits  GLUCOSE, CAPILLARY - Abnormal; Notable for the following:    Glucose-Capillary 522 (*)    All other components within normal  limits  POCT PREGNANCY, URINE  POCT PREGNANCY, URINE  BLOOD GAS, CORD  POCT CBG MONITORING   Dg Chest 2 View  04/29/2011  *RADIOLOGY REPORT*  Clinical Data: Hyperglycemia, chest tightness, smoker  CHEST - 2 VIEW  Comparison: 09/28/2009  Findings: Chronic central bronchitic changes.  No focal pneumonia, collapse, consolidation, effusion or pneumothorax.  Trachea midline.  Stable exam.  IMPRESSION: Chronic bronchial changes.  No focal pneumonia.  Original Report Authenticated By: Judie Petit. [name], M.D.     1. DKA (diabetic ketoacidoses)       MDM  21yo F w/ h/o type 1 diabetes transferred from [facility] for hyperglycemia.  She has been experiencing polyuria, polydipsia, fatigue and N/V.  Has not taken her insulin since yesterday because recently dropped from parent's insurance plan.  Triage BG 596.  Bicarb 11 and anion gap of 26.  Pt has received 1L NS bolus and is on glucostabilizer.  Nausea improved w/ vicodin.  VS stable; tachycardic at approx 112bmp currently.  [facility] consulted for admission.  7:44 PM    Medical screening examination/treatment/procedure(s) were conducted as a shared visit with non-physician practitioner(s) and myself.  I personally evaluated the patient during the encounter 21 year old diabetic woman who ran out of insulin yesterday when she found that she was no longer on her father's insurance and could not buy it.  Today her lab tests show DKA.  Will need admission for IV rehydration, insulin. Osvaldo Human, M.D.     Arie Sabina [place], [place] 04/30/11 0122  Carleene Cooper III, MD 05/01/11 [phone]

## 2011-04-29 NOTE — ED Notes (Signed)
Pt states she ran out of insulin and meds yesterday. States bs this am was 160. Feels like sugar is too high

## 2011-04-29 NOTE — ED Notes (Signed)
Report given to Lorene Dy, ed charge nurse. Pt transported by [facility]

## 2011-04-29 NOTE — ED Notes (Signed)
CBG was 216 

## 2011-04-29 NOTE — H&P (Signed)
[name] MRN: [id number] DOB/AGE: November 06, 1990 21 y.o. Primary Care Physician:No primary provider on file. Admit date: 04/29/2011 Chief Complaint: Hyperglycemia  HPI: The patient is a 21 year old brittle diabetic who had been on insulin pump, but was being maintained on Lantus and Apidra insulins. Her father took her off of his insurance policy on December 31 and she was unable to get her insulin still. She hasn't had any insulin since yesterday morning. Today she notes excessive thirst and urination, she vomited once, and feels weak and tired. She denies any fever chills, Donald pain, diarrhea, shortness of breath or chest pain, or urinary symptoms.She is found to be in DKA in the ER,She was once hospitalised for the same in 2011.  Went to [facility], BG in 600s and transferred ED. Pt denies having any recent illnesses. Per prior chart, most recent admission for DKA 09/2009. Hgb A1c 12.1 at that time. Bicarb 11 and anion gap of 26.     Past Medical History  Diagnosis Date  . Diabetes mellitus   . Asthma     History reviewed. No pertinent past surgical history.  Prior to Admission medications   Medication Sig Start Date End Date Taking? Authorizing Provider  insulin glargine (LANTUS) 100 UNIT/ML injection Inject 40 Units into the skin at bedtime.   Yes Historical Provider, MD  insulin glulisine (APIDRA) 100 UNIT/ML injection Inject 80 Units into the skin 3 (three) times daily before meals. Per sliding scale   Yes Historical Provider, MD    Allergies: No Known Allergies  History reviewed. No pertinent family history.  Social History:  reports that she has been passively smoking.  She does not have any smokeless tobacco history on file. She reports that she does not drink alcohol or use illicit drugs.         NWG:NFAOZH of Systems: Other than noted above, the patient denies any of the following symptoms.  Systemic: No fever, chills, sweats, fatigue, myalgias, headache, or anorexia.   Eye: No redness, pain or drainage.  ENT: No earache, nasal congestion, rhinorrhea, sinus pressure, or sore throat.  Lungs: No cough, sputum production, wheezing, shortness of breath.  Cardiovascular: No chest pain, palpitations, or syncope.  GI: No nausea, vomiting, abdominal pain or diarrhea.  GU: No dysuria, frequency, or hematuria.  Skin: No rash or pruritis.   PHYSICAL EXAM: Blood pressure 103/46, pulse 108, temperature 98 F (36.7 C), temperature source Axillary, resp. rate 22, height 5\' 4"  (1.626 m), weight 64.864 kg (143 lb), SpO2 100.00%. General: Alert, in no distress. She has acetone odor on her breath.  Eye: PERRL, full EOMs. Lids and conjunctivas were normal.  ENT: TMs and canals were normal, without erythema or inflammation. Nasal mucosa was clear and uncongested, without drainage. Mucous membranes were moist. Pharynx was clear, without exudate or drainage. There were no oral ulcerations or lesions.  Neck: Supple, no adenopathy, tenderness or mass. Thyroid was normal.  Lungs: No respiratory distress. Lungs were clear to auscultation, without wheezes, rales or rhonchi. Breath sounds were clear and equal bilaterally.  Heart: Regular rhythm, without gallops, murmers or rubs.  Abdomen: Soft, flat, and non-tender to palpation. No hepatosplenomagaly or mass.  Skin: Clear, warm, and dry, without rash or lesions. .  No results found for this or any previous visit (from the past 240 hour(s)).   Results for orders placed during the hospital encounter of 04/29/11 (from the past 48 hour(s))  GLUCOSE, CAPILLARY     Status: Abnormal   Collection Time  04/29/11  5:23 PM      Component Value Range Comment   Glucose-Capillary 596 (*) 70 - 99 (mg/dL)    Comment 1 Notify RN     CBC     Status: Abnormal   Collection Time   04/29/11  5:47 PM      Component Value Range Comment   WBC 12.2 (*) 4.0 - 10.5 (K/uL)    RBC 4.43  3.87 - 5.11 (MIL/uL)    Hemoglobin 12.9  12.0 - 15.0 (g/dL)     HCT 16.1  09.6 - 04.5 (%)    MCV 86.9  78.0 - 100.0 (fL)    MCH 29.1  26.0 - 34.0 (pg)    MCHC 33.5  30.0 - 36.0 (g/dL)    RDW 40.9  81.1 - 91.4 (%)    Platelets 313  150 - 400 (K/uL)   DIFFERENTIAL     Status: Abnormal   Collection Time   04/29/11  5:47 PM      Component Value Range Comment   Neutrophils Relative 81 (*) 43 - 77 (%)    Neutro Abs 9.9 (*) 1.7 - 7.7 (K/uL)    Lymphocytes Relative 15  12 - 46 (%)    Lymphs Abs 1.8  0.7 - 4.0 (K/uL)    Monocytes Relative 4  3 - 12 (%)    Monocytes Absolute 0.5  0.1 - 1.0 (K/uL)    Eosinophils Relative 0  0 - 5 (%)    Eosinophils Absolute 0.0  0.0 - 0.7 (K/uL)    Basophils Relative 0  0 - 1 (%)    Basophils Absolute 0.0  0.0 - 0.1 (K/uL)   BASIC METABOLIC PANEL     Status: Abnormal   Collection Time   04/29/11  5:47 PM      Component Value Range Comment   Sodium 134 (*) 135 - 145 (mEq/L)    Potassium 4.5  3.5 - 5.1 (mEq/L)    Chloride 97  96 - 112 (mEq/L)    CO2 11 (*) 19 - 32 (mEq/L)    Glucose, Bld 564 (*) 70 - 99 (mg/dL)    BUN 12  6 - 23 (mg/dL)    Creatinine, Ser 7.82  0.50 - 1.10 (mg/dL)    Calcium 9.8  8.4 - 10.5 (mg/dL)    GFR calc non Af Amer >90  >90 (mL/min)    GFR calc Af Amer >90  >90 (mL/min)   URINALYSIS, ROUTINE W REFLEX MICROSCOPIC     Status: Abnormal   Collection Time   04/29/11  6:12 PM      Component Value Range Comment   Color, Urine YELLOW  YELLOW     APPearance CLEAR  CLEAR     Specific Gravity, Urine 1.027  1.005 - 1.030     pH 5.0  5.0 - 8.0     Glucose, UA >1000 (*) NEGATIVE (mg/dL)    Hgb urine dipstick NEGATIVE  NEGATIVE     Bilirubin Urine NEGATIVE  NEGATIVE     Ketones, ur >80 (*) NEGATIVE (mg/dL)    Protein, ur NEGATIVE  NEGATIVE (mg/dL)    Urobilinogen, UA 0.2  0.0 - 1.0 (mg/dL)    Nitrite NEGATIVE  NEGATIVE     Leukocytes, UA NEGATIVE  NEGATIVE    URINE MICROSCOPIC-ADD ON     Status: Abnormal   Collection Time   04/29/11  6:12 PM      Component Value Range Comment   Squamous Epithelial /  LPF FEW (*) RARE    POCT PREGNANCY, URINE     Status: Normal   Collection Time   04/29/11  6:15 PM      Component Value Range Comment   Preg Test, Ur NEGATIVE     GLUCOSE, CAPILLARY     Status: Abnormal   Collection Time   04/29/11  6:49 PM      Component Value Range Comment   Glucose-Capillary 522 (*) 70 - 99 (mg/dL)   GLUCOSE, CAPILLARY     Status: Abnormal   Collection Time   04/29/11  8:01 PM      Component Value Range Comment   Glucose-Capillary 395 (*) 70 - 99 (mg/dL)     No results found.  Impression:   Active Problems:  1)DKA (diabetic ketoacidoses) admit to step down per DKA protocol, follow abg and bmet   Will not transition to SQ insulin until bicarb has normalised and gap is closed.CXR to r/o PNA  2) Medical noncompliance due to lack of insurance .  SW consult will be obtained.          [name] 04/29/2011, 8:11 PM

## 2011-04-29 NOTE — ED Provider Notes (Signed)
Chief Complaint  Patient presents with  . Hyperglycemia    ran out of insulin last night, states feels like blood sugar is high    History of Present Illness:  The patient is a 21 year old brittle diabetic who had been on insulin pump, but was being maintained on Lantus and Apidra insulins. Her father took her off of his insurance policy on December 31 and she was unable to get her insulin still. She hasn't had any insulin since yesterday morning. Today she notes excessive thirst and urination, she vomited once, and feels weak and tired. She denies any fever chills, Donald pain, diarrhea, shortness of breath or chest pain, or urinary symptoms.  Review of Systems:  Other than noted above, the patient denies any of the following symptoms. Systemic:  No fever, chills, sweats, fatigue, myalgias, headache, or anorexia. Eye:  No redness, pain or drainage. ENT:  No earache, nasal congestion, rhinorrhea, sinus pressure, or sore throat. Lungs:  No cough, sputum production, wheezing, shortness of breath.  Cardiovascular:  No chest pain, palpitations, or syncope. GI:  No nausea, vomiting, abdominal pain or diarrhea. GU:  No dysuria, frequency, or hematuria. Skin:  No rash or pruritis.  PMFSH:  Past medical history, family history, social history, meds, and allergies were reviewed.  Physical Exam:   Vital signs:  BP 139/82  Pulse 114  Temp(Src) 98.6 F (37 C) (Oral)  Resp 20  SpO2 98% General:  Alert, in no distress.  She has acetone odor on her breath. Eye:  PERRL, full EOMs.  Lids and conjunctivas were normal. ENT:  TMs and canals were normal, without erythema or inflammation.  Nasal mucosa was clear and uncongested, without drainage.  Mucous membranes were moist.  Pharynx was clear, without exudate or drainage.  There were no oral ulcerations or lesions. Neck:  Supple, no adenopathy, tenderness or mass. Thyroid was normal. Lungs:  No respiratory distress.  Lungs were clear to auscultation,  without wheezes, rales or rhonchi.  Breath sounds were clear and equal bilaterally. Heart:  Regular rhythm, without gallops, murmers or rubs. Abdomen:  Soft, flat, and non-tender to palpation.  No hepatosplenomagaly or mass. Skin:  Clear, warm, and dry, without rash or lesions.  Labs:   Results for orders placed during the hospital encounter of 04/29/11  GLUCOSE, CAPILLARY      Component Value Range   Glucose-Capillary >600 (*) 70 - 99 (mg/dL)  POCT I-STAT, CHEM 8      Component Value Range   Sodium 131 (*) 135 - 145 (mEq/L)   Potassium 4.4  3.5 - 5.1 (mEq/L)   Chloride 103  96 - 112 (mEq/L)   BUN 12  6 - 23 (mg/dL)   Creatinine, Ser 1.61  0.50 - 1.10 (mg/dL)   Glucose, Bld 096 (*) 70 - 99 (mg/dL)   Calcium, Ion 0.45  4.09 - 1.32 (mmol/L)   TCO2 13  0 - 100 (mmol/L)   Hemoglobin 15.0  12.0 - 15.0 (g/dL)   HCT 81.1  91.4 - 78.2 (%)   Comment NOTIFIED PHYSICIAN       Medications given in UCC:  IV normal saline was started and she will be sent over to the ED via [facility]  Assessment:   Diagnoses that have been ruled out:  None  Diagnoses that are still under consideration:  None  Final diagnoses:  Diabetic ketoacidosis    Plan:   1.  The following meds were prescribed:   New Prescriptions   No medications on  file   2.  The patient was instructed in symptomatic care and handouts were given. 3.  The patient was told to return if becoming worse in any way, if no better in 3 or 4 days, and given some red flag symptoms that would indicate earlier return.   Roque Lias, MD 04/29/11 [phone]

## 2011-04-30 LAB — BLOOD GAS, ARTERIAL
Acid-base deficit: 9.4 mmol/L — ABNORMAL HIGH (ref 0.0–2.0)
Drawn by: 23604
FIO2: 0.21 %
pCO2 arterial: 30.2 mmHg — ABNORMAL LOW (ref 35.0–45.0)
pO2, Arterial: 131 mmHg — ABNORMAL HIGH (ref 80.0–100.0)

## 2011-04-30 LAB — BASIC METABOLIC PANEL
BUN: 5 mg/dL — ABNORMAL LOW (ref 6–23)
Calcium: 9 mg/dL (ref 8.4–10.5)
Calcium: 9 mg/dL (ref 8.4–10.5)
Calcium: 9.1 mg/dL (ref 8.4–10.5)
Calcium: 8.5 mg/dL (ref 8.4–10.5)
Calcium: 8.7 mg/dL (ref 8.4–10.5)
Chloride: 104 mEq/L (ref 96–112)
Creatinine, Ser: 0.49 mg/dL — ABNORMAL LOW (ref 0.50–1.10)
Creatinine, Ser: 0.47 mg/dL — ABNORMAL LOW (ref 0.50–1.10)
Creatinine, Ser: 0.43 mg/dL — ABNORMAL LOW (ref 0.50–1.10)
GFR calc Af Amer: >90 mL/min (ref >90)
GFR calc Af Amer: >90 mL/min (ref >90)
GFR calc Af Amer: >90 mL/min (ref >90)
GFR calc Af Amer: >90 mL/min (ref >90)
GFR calc non Af Amer: >90 mL/min (ref >90)
GFR calc non Af Amer: >90 mL/min (ref >90)
GFR calc non Af Amer: >90 mL/min (ref >90)
GFR calc non Af Amer: >90 mL/min (ref >90)
GFR calc non Af Amer: >90 mL/min (ref >90)
Potassium: 3.4 mEq/L — ABNORMAL LOW (ref 3.5–5.1)
Sodium: 135 mEq/L (ref 135–145)
Sodium: 137 mEq/L (ref 135–145)

## 2011-04-30 LAB — CBC
Platelets: 304 10*3/uL (ref 150–400)
RBC: 3.93 MIL/uL (ref 3.87–5.11)
WBC: 10.5 10*3/uL (ref 4.0–10.5)

## 2011-04-30 LAB — GLUCOSE, CAPILLARY
Glucose-Capillary: 147 mg/dL — ABNORMAL HIGH (ref 70–99)
Glucose-Capillary: 148 mg/dL — ABNORMAL HIGH (ref 70–99)
Glucose-Capillary: 214 mg/dL — ABNORMAL HIGH (ref 70–99)
Glucose-Capillary: 175 mg/dL — ABNORMAL HIGH (ref 70–99)
Glucose-Capillary: 164 mg/dL — ABNORMAL HIGH (ref 70–99)
Glucose-Capillary: 154 mg/dL — ABNORMAL HIGH (ref 70–99)
Glucose-Capillary: 260 mg/dL — ABNORMAL HIGH (ref 70–99)
Glucose-Capillary: 309 mg/dL — ABNORMAL HIGH (ref 70–99)
Glucose-Capillary: 221 mg/dL — ABNORMAL HIGH (ref 70–99)
Glucose-Capillary: 64 mg/dL — ABNORMAL LOW (ref 70–99)
Glucose-Capillary: 159 mg/dL — ABNORMAL HIGH (ref 70–99)
Glucose-Capillary: 201 mg/dL — ABNORMAL HIGH (ref 70–99)

## 2011-04-30 LAB — MRSA PCR SCREENING: MRSA by PCR: NEGATIVE

## 2011-04-30 MED ORDER — POTASSIUM CHLORIDE 10 MEQ/100ML IV SOLN
10.0000 meq | INTRAVENOUS | Status: AC
  Administered 2011-04-30 (×2): 10 meq via INTRAVENOUS
  Filled 2011-04-30 (×2): qty 100

## 2011-04-30 MED ORDER — INSULIN NPH (HUMAN) (ISOPHANE) 100 UNIT/ML ~~LOC~~ SUSP
14.0000 [IU] | Freq: Two times a day (BID) | SUBCUTANEOUS | Status: DC
  Administered 2011-04-30 – 2011-05-01 (×3): 14 [IU] via SUBCUTANEOUS
  Filled 2011-04-30: qty 10

## 2011-04-30 MED ORDER — INSULIN ASPART 100 UNIT/ML ~~LOC~~ SOLN
0.0000 [IU] | Freq: Three times a day (TID) | SUBCUTANEOUS | Status: DC
  Administered 2011-05-01: 3 [IU] via SUBCUTANEOUS
  Filled 2011-04-30: qty 3

## 2011-04-30 MED ORDER — SODIUM CHLORIDE 0.9 % IV SOLN
INTRAVENOUS | Status: DC
  Administered 2011-04-30: 17:00:00 via INTRAVENOUS
  Administered 2011-05-01: 100 mL/h via INTRAVENOUS

## 2011-04-30 MED ORDER — INSULIN ASPART 100 UNIT/ML ~~LOC~~ SOLN: 0.0000 [IU] | Freq: Every day | SUBCUTANEOUS | Status: DC

## 2011-04-30 MED ORDER — PANTOPRAZOLE SODIUM 40 MG PO TBEC: 40.0000 mg | DELAYED_RELEASE_TABLET | Freq: Every day | ORAL | Status: DC

## 2011-04-30 NOTE — Progress Notes (Signed)
[facility] [place] TEAM 8  Subjective: The patient is a 21 year old brittle diabetic who had been on insulin pump, but was being maintained on Lantus and Apidra insulins. Her father took her off of his insurance policy on December 31 and she was unable to get her insulin.  She was found to be in DKA in the ER.  She was once hospitalized for the same in 2011.  At the time of my evaluation this morning the patient is resting comfortably.  She denies fevers chills nausea vomiting shortness of breath or chest pain.  She has no abdominal cramps or diarrhea.  She is quite unhappy that she was is not yet allow to eat.  I explained to her in great detail the appropriate treatment of DKA and the fact that we would not be able to allow her caloric intake until such time that her anion gap is closed and she is weaned from the insulin drip.  Objective: Weight change:   Intake/Output Summary (Last 24 hours) at 04/30/11 0837 Last data filed at 04/30/11 0600  Gross per 24 hour  Intake   1450 ml  Output    450 ml  Net   1000 ml   Blood pressure 99/51, pulse 108, temperature 98.3 F (36.8 C), temperature source Oral, resp. rate 20, height 5\' 5"  (1.651 m), weight 63.7 kg (140 lb 6.9 oz), SpO2 99.00%.  Physical Exam: General: No acute respiratory distress Lungs: Clear to auscultation bilaterally without wheezes or crackles Cardiovascular: Tachycardic with a regular rhythm without murmur gallop or rub normal S1 and S2 Abdomen: Nontender, nondistended, soft, bowel sounds positive, no rebound, no ascites, no appreciable mass Extremities: No significant cyanosis, clubbing, or edema bilateral lower extremities  Lab Results:  [facility] 04/30/11 0506 04/30/11 0025 04/29/11 1747  NA 135 138 134*  K 4.0 4.1 4.5  CL 105 108 97  CO2 14* 14* 11*  GLUCOSE 251* 163* 564*  BUN 6 8 12   CREATININE 0.46* 0.49* 0.58  CALCIUM 9.0 9.0 9.8  MG -- -- --  PHOS -- -- --    Basename 04/30/11 0025 04/29/11  1747 04/29/11 1649  WBC 10.5 12.2* --  NEUTROABS -- 9.9* --  HGB 11.3* 12.9 15.0  HCT 33.6* 38.5 44.0  MCV 85.5 86.9 --  PLT 304 313 --   Micro Results: Recent Results (from the past 240 hour(s))  MRSA PCR SCREENING     Status: Normal   Collection Time   04/29/11 11:15 PM      Component Value Range Status Comment   MRSA by PCR NEGATIVE  NEGATIVE  Final     Studies/Results: All recent x-ray/radiology reports have been reviewed in detail.   Medications: I have reviewed the patient's complete medication list.  Assessment/Plan:  DKA Continue usual treatment utilizing volume resuscitation, insulin drip, and glucose stabilizer protocol - assure the insulin drip has not discontinued until gap is closed and bicarbonate is normalized - I have educated the patient on the life-threatening serious nature of DKA in her need to avoid recurrence of this issue in the  Uncontrolled DM 1 Do to insulin noncompliance - with loss of insurance is not able to obtain current prescribed insulin, insulin pump, or even followup with her prior physician - social work/case management consult - follow CBGs closely once off glucose stablizer - diabetic education to continue  Medical noncompliance See my discussion above - we will assist the patient in any way possible  Lonia Blood, MD [facility] Office  [phone] Pager [phone]  On-Call/Text Page:      [email or web address]      password [facility]

## 2011-04-30 NOTE — Progress Notes (Signed)
PT HAS APPT AT [facility] FOR [facility] APPT ON FEB 1ST AT 2PM AND A DR'S APPT WITH [facility]. [facility]  FEB 28TH AT [place] Willa Rough 04/30/2011 [phone] OR [phone]

## 2011-04-30 NOTE — Progress Notes (Signed)
Patient admitted with DKA.  Patient currently ran out of Lantus and Apidra and unfortunately has been without of insurance since 12/31.  Patient has been actively trying to get insurance and assistance but unable to on her own.    Apidra and Lantus assistance savings card was filled out with her information and given to patient.  Also patient has eligibility appt at [facility]  Patient will need assistance with bridging the gap with insulin assistance until that time.  Care management aware and is involved.  In future, if patient is unable to to afford Lantus and Apidra, may consider NPH  25 units BID and regular insulin:  I unit for 9 gram of CHOs.

## 2011-04-30 NOTE — Plan of Care (Signed)
Problem: Phase I Progression Outcomes Goal: K+ level approaching normal with therapy Outcome: Not Progressing 2 runs k given

## 2011-04-30 NOTE — Progress Notes (Deleted)
[facility] appt arranged for elig on Feb 1st at 1150am and a appt with dr Sherron Flemings on Feb 28th at 915 am. [name] 04/30/2011 [phone] OR [phone]

## 2011-05-01 LAB — GLUCOSE, CAPILLARY
Glucose-Capillary: 118 mg/dL — ABNORMAL HIGH (ref 70–99)
Glucose-Capillary: 130 mg/dL — ABNORMAL HIGH (ref 70–99)
Glucose-Capillary: 140 mg/dL — ABNORMAL HIGH (ref 70–99)
Glucose-Capillary: 151 mg/dL — ABNORMAL HIGH (ref 70–99)
Glucose-Capillary: 162 mg/dL — ABNORMAL HIGH (ref 70–99)
Glucose-Capillary: 184 mg/dL — ABNORMAL HIGH (ref 70–99)
Glucose-Capillary: 189 mg/dL — ABNORMAL HIGH (ref 70–99)
Glucose-Capillary: 215 mg/dL — ABNORMAL HIGH (ref 70–99)
Glucose-Capillary: 60 mg/dL — ABNORMAL LOW (ref 70–99)

## 2011-05-01 LAB — BASIC METABOLIC PANEL
BUN: 3 mg/dL — ABNORMAL LOW (ref 6–23)
BUN: 3 mg/dL — ABNORMAL LOW (ref 6–23)
BUN: 4 mg/dL — ABNORMAL LOW (ref 6–23)
CO2: 20 mEq/L (ref 19–32)
CO2: 22 mEq/L (ref 19–32)
Calcium: 8.7 mg/dL (ref 8.4–10.5)
Calcium: 8.8 mg/dL (ref 8.4–10.5)
Calcium: 8.9 mg/dL (ref 8.4–10.5)
Chloride: 103 mEq/L (ref 96–112)
Creatinine, Ser: 0.39 mg/dL — ABNORMAL LOW (ref 0.50–1.10)
GFR calc Af Amer: >90 mL/min (ref >90)
GFR calc Af Amer: >90 mL/min (ref >90)
GFR calc Af Amer: >90 mL/min (ref >90)
GFR calc non Af Amer: >90 mL/min (ref >90)
GFR calc non Af Amer: >90 mL/min (ref >90)
GFR calc non Af Amer: >90 mL/min (ref >90)
GFR calc non Af Amer: >90 mL/min (ref >90)
Glucose, Bld: 188 mg/dL — ABNORMAL HIGH (ref 70–99)
Glucose, Bld: 286 mg/dL — ABNORMAL HIGH (ref 70–99)
Potassium: 3.3 mEq/L — ABNORMAL LOW (ref 3.5–5.1)
Potassium: 3.2 mEq/L — ABNORMAL LOW (ref 3.5–5.1)
Potassium: 3.4 mEq/L — ABNORMAL LOW (ref 3.5–5.1)
Potassium: 3.5 mEq/L (ref 3.5–5.1)
Sodium: 138 mEq/L (ref 135–145)
Sodium: 140 mEq/L (ref 135–145)
Sodium: 138 mEq/L (ref 135–145)
Sodium: 136 mEq/L (ref 135–145)

## 2011-05-01 MED ORDER — INSULIN GLARGINE 100 UNIT/ML ~~LOC~~ SOLN: 40.0000 [IU] | Freq: Every day | SUBCUTANEOUS | Status: DC

## 2011-05-01 MED ORDER — POTASSIUM CHLORIDE CRYS ER 20 MEQ PO TBCR
40.0000 meq | EXTENDED_RELEASE_TABLET | Freq: Once | ORAL | Status: AC
  Administered 2011-05-01: 40 meq via ORAL
  Filled 2011-05-01: qty 2

## 2011-05-01 MED ORDER — INSULIN ASPART 100 UNIT/ML ~~LOC~~ SOLN: 4.0000 [IU] | Freq: Three times a day (TID) | SUBCUTANEOUS | Status: DC

## 2011-05-01 MED ORDER — INSULIN GLULISINE 100 UNIT/ML ~~LOC~~ SOLN: SUBCUTANEOUS | Status: DC

## 2011-05-01 MED ORDER — INSULIN ASPART 100 UNIT/ML ~~LOC~~ SOLN: 0.0000 [IU] | Freq: Every day | SUBCUTANEOUS | Status: DC

## 2011-05-01 MED ORDER — INSULIN GLARGINE 100 UNIT/ML ~~LOC~~ SOLN
40.0000 [IU] | Freq: Once | SUBCUTANEOUS | Status: DC
  Filled 2011-05-01: qty 3

## 2011-05-01 MED ORDER — INSULIN ASPART 100 UNIT/ML ~~LOC~~ SOLN: 0.0000 [IU] | Freq: Three times a day (TID) | SUBCUTANEOUS | Status: DC

## 2011-05-01 NOTE — Progress Notes (Signed)
Patient is going home with her mother.  Discharge instructions given.  Prescription for insulins given by Dr. Butler Denmark to the patient.  Discharge to home in stable condition. Denies pain. Wheeled to vehicle by volunteer discharge.

## 2011-05-01 NOTE — Discharge Summary (Signed)
DISCHARGE SUMMARY  [name]  MR#: [id number]  DOB:[birth year]  Date of Admission: 04/29/2011 Date of Discharge: 05/01/2011  Attending Physician:[name]  Patient's PCP:No primary provider on file.   Presenting Complaint: Hyperglycemia   Discharge Diagnoses: DKA (diabetic ketoacidoses) Financial issues Asthma    Discharge Medications: Discharge Medication List as of 05/01/2011  2:27 PM    CONTINUE these medications which have CHANGED   Details  insulin glargine (LANTUS) 100 UNIT/ML injection Inject 40 Units into the skin at bedtime., Starting 05/01/2011, Until Discontinued, Print    insulin glulisine (APIDRA) 100 UNIT/ML injection Per previous sliding scale, Print         Procedures: Dg Chest 2 View  04/29/2011  *RADIOLOGY REPORT*  Clinical Data: Hyperglycemia, chest tightness, smoker  CHEST - 2 VIEW  Comparison: 09/28/2009  Findings: Chronic central bronchitic changes.  No focal pneumonia, collapse, consolidation, effusion or pneumothorax.  Trachea midline.  Stable exam.  IMPRESSION: Chronic bronchial changes.  No focal pneumonia.  Original Report Authenticated By: [name], M.D.     Hospital Course:  DKA The patient is a 21 year old brittle diabetic who had been on insulin pump in the past, but recently was being maintained on Lantus and Apidra insulins. Her father took her off of his insurance policy on December 31 and she was unable to get her insulin. She presented on 1/13 with vomiting, excessive urination and weakness and was found to have DKA. She was started on IV insulin via out DKA protocol and was able to be transitioned off early this AM.    Through our social services and [employer], she has been able to receive assistance with her medications. In addition, she will be taking the Novolog pen we have been using in the hospital home with her with her (which contains almost 300 units of insulin). She is advised to continue the sliding  scale and Lantus dosages she was started on by her Endocrinologist [name], [employer] [facility]) and attempt to get back into see her.   Day of Discharge Physical Exam: BP 135/88  Pulse 84  Temp(Src) 98.1 F (36.7 C) (Oral)  Resp 18  Ht 5\' 5"  (1.651 m)  Wt 63.7 kg (140 lb 6.9 oz)  BMI 23.37 kg/m2  SpO2 100%  General: No acute respiratory distress  Lungs: Clear to auscultation bilaterally without wheezes or crackles  Cardiovascular: Tachycardic with a regular rhythm without murmur gallop or rub normal S1 and S2  Abdomen: Nontender, nondistended, soft, bowel sounds positive, no rebound, no ascites, no appreciable mass  Extremities: No significant cyanosis, clubbing, or edema bilateral lower extremities    Disposition: stable   Follow-up Appts: Discharge Orders    Future Orders Please Complete By Expires   Diet - low sodium heart healthy      Comments:   Strict Diabetic diet   Increase activity slowly         Follow-up with Dr.Young, [name] in 2-3 weeks.  Time on Discharge: 50 min.   SignedCalvert Norman 05/01/2011, 3:32 PM

## 2011-05-01 NOTE — Progress Notes (Signed)
Dr. Sharon Seller is informed regarding patient's blood glucose of 312 mg/dl and has an order to stopped insulin drip after 3 hours of administering NPH.  Informed that in an hour, insulin drip will be discontinued.  MD stated to continue insulin drip per glucostabilizer and change the IVF to Normal Saline at 100 ml/hr. IVF changed as ordered. Report given to oncoming shift.

## 2011-05-01 NOTE — Progress Notes (Signed)
Clinical social worker received consult for "other psychosocial issues." CSW consulted with RNCM, who arranged for [facility] and a way for pt to obtain medications. CSW met with pt prior to discharge. Pt shared that she has applied for Medicaid. Pt will be returning home and will be transported by her mother. Pt did not voice any further concerns. CSW signing off.  Dede Query, MSW, [facility] (Team #8 Coverage) [phone]

## 2011-05-01 NOTE — Progress Notes (Signed)
Diabetes Coordinator 05-01-11 @ 1310: Spoke with Case Manager Victorino Dike) regarding discharge insulins for this patient. Pt has the Lantus and Apidra (type of analogue like Novolog) coupons which will give her Lantus with a disocunt and Apidra at no cost. I enrolled patient in these programs and she is approved.  Pt will also be enrolled in [place] to use at [employer] (at [facility]) which will pay whatever the discount coupons will not pay. If MD will write discharge prescriptions for the following, she can get these at [facility] at no cost until she can get what she needs at [facility].  Prescriptions needed:  Lantus pen (from hospital) at 40 units q HS and prescription for Lantus pens 1 box, 40 units qHS  Novolog pen from hospital to use as directed (sensitive correction tidwc and HS scale and meal coverage as 1 unit per 9 grams              carbohydrate per meal and prescription for Apidra insulin to use as directed above regarding correction and meal coverage.  Prescription for insulin pen needles  Prescription for test strips, although pt could use generic meter and test strips from [facility]; to test tidwc and at HS. Please call if I can be of further assistance.  Thank you, Lenor Coffin, RN, CNS [phone])

## 2011-05-01 NOTE — Progress Notes (Signed)
At 2000 04/30/2011 kirby np notified of pt has order for glucose stabilizer and insulin drip along with ssc insulin and blood sugars ac/hs, she instructed for me to keep pt on glucose stabilizer and follow the prompts.pt also to remain on ns ivf.At 2330 kirby np was notified regardingblood sugars and nph insulin, instructed to call if cbg less than 250 4 times in a row and to go ahead and give pt nph 14 units os insulin as orderd. At 0215 notified kirby np of cbg=60 pt asymptomatic and per glucose stabilizer insulin to be on hold and 16cc d50 given. cbg continues to improve and at 0445 insulin drip remains off per glucose stabilizer.[facility] [employer]

## 2011-05-01 NOTE — Progress Notes (Signed)
PT IS TO BE DC'D TO HOME TODAY.  PT HAS RECEIVED ASSISTANCE WITH INSULINS AND WILL GO TO FILL THEM AT [facility] AT [place].   Willa Rough 05/01/2011 [phone] OR [phone]

## 2011-10-16 DIAGNOSIS — J45990 Exercise induced bronchospasm: Secondary | ICD-10-CM | POA: Insufficient documentation

## 2012-05-08 ENCOUNTER — Emergency Department (HOSPITAL_COMMUNITY)
Admission: EM | Admit: 2012-05-08 | Discharge: 2012-05-08 | Disposition: A | Discharge status: Home / Self Care | Payer: Self-pay | Attending: Emergency Medicine | Admitting: Emergency Medicine

## 2012-05-08 ENCOUNTER — Encounter (HOSPITAL_COMMUNITY): Payer: Self-pay | Admitting: *Deleted

## 2012-05-08 DIAGNOSIS — E1169 Type 2 diabetes mellitus with other specified complication: Secondary | ICD-10-CM | POA: Insufficient documentation

## 2012-05-08 DIAGNOSIS — Z794 Long term (current) use of insulin: Secondary | ICD-10-CM | POA: Insufficient documentation

## 2012-05-08 DIAGNOSIS — J45909 Unspecified asthma, uncomplicated: Secondary | ICD-10-CM | POA: Insufficient documentation

## 2012-05-08 DIAGNOSIS — R739 Hyperglycemia, unspecified: Secondary | ICD-10-CM

## 2012-05-08 DIAGNOSIS — Z3202 Encounter for pregnancy test, result negative: Secondary | ICD-10-CM | POA: Insufficient documentation

## 2012-05-08 LAB — URINALYSIS, ROUTINE W REFLEX MICROSCOPIC
Glucose, UA: >1000 mg/dL — AB
Hgb urine dipstick: NEGATIVE
Specific Gravity, Urine: 1.036 — ABNORMAL HIGH (ref 1.005–1.030)
Urobilinogen, UA: 0.2 mg/dL (ref 0.0–1.0)
pH: 5.5 (ref 5.0–8.0)

## 2012-05-08 LAB — BASIC METABOLIC PANEL
Chloride: 98 mEq/L (ref 96–112)
GFR calc Af Amer: >90 mL/min (ref >90)
GFR calc non Af Amer: >90 mL/min (ref >90)
Potassium: 3.5 mEq/L (ref 3.5–5.1)
Sodium: 133 mEq/L — ABNORMAL LOW (ref 135–145)

## 2012-05-08 LAB — BLOOD GAS, ARTERIAL
Drawn by: 257701
FIO2: 0.21 %
O2 Saturation: 97.6 %
pO2, Arterial: 103 mmHg — ABNORMAL HIGH (ref 80.0–100.0)

## 2012-05-08 LAB — CBC WITH DIFFERENTIAL/PLATELET
Basophils Relative: 0 % (ref 0–1)
Hemoglobin: 13.9 g/dL (ref 12.0–15.0)
Lymphocytes Relative: 32 % (ref 12–46)
Lymphs Abs: 2.6 10*3/uL (ref 0.7–4.0)
Monocytes Relative: 5 % (ref 3–12)
Neutro Abs: 5.1 10*3/uL (ref 1.7–7.7)
Neutrophils Relative %: 63 % (ref 43–77)
RBC: 4.87 MIL/uL (ref 3.87–5.11)
WBC: 8.1 10*3/uL (ref 4.0–10.5)

## 2012-05-08 LAB — POCT I-STAT, CHEM 8
Calcium, Ion: 1.22 mmol/L (ref 1.12–1.23)
Chloride: 107 mEq/L (ref 96–112)
Glucose, Bld: 302 mg/dL — ABNORMAL HIGH (ref 70–99)
HCT: 36 % (ref 36.0–46.0)
Hemoglobin: 12.2 g/dL (ref 12.0–15.0)
TCO2: 20 mmol/L (ref 0–100)

## 2012-05-08 LAB — GLUCOSE, CAPILLARY
Glucose-Capillary: 283 mg/dL — ABNORMAL HIGH (ref 70–99)
Glucose-Capillary: 289 mg/dL — ABNORMAL HIGH (ref 70–99)

## 2012-05-08 LAB — PREGNANCY, URINE: Preg Test, Ur: NEGATIVE

## 2012-05-08 LAB — URINE MICROSCOPIC-ADD ON

## 2012-05-08 MED ORDER — SODIUM CHLORIDE 0.9 % IV SOLN: INTRAVENOUS | Status: DC

## 2012-05-08 MED ORDER — SODIUM CHLORIDE 0.9 % IV BOLUS (SEPSIS)
2000.0000 mL | Freq: Once | INTRAVENOUS | Status: AC
  Administered 2012-05-08: 2000 mL via INTRAVENOUS

## 2012-05-08 MED ORDER — INSULIN ASPART 100 UNIT/ML ~~LOC~~ SOLN
2.0000 [IU] | Freq: Once | SUBCUTANEOUS | Status: AC
  Administered 2012-05-08: 2 [IU] via SUBCUTANEOUS
  Filled 2012-05-08: qty 1

## 2012-05-08 MED ORDER — INSULIN GLARGINE 100 UNIT/ML ~~LOC~~ SOLN
40.0000 [IU] | Freq: Once | SUBCUTANEOUS | Status: AC
  Administered 2012-05-08: 40 [IU] via SUBCUTANEOUS
  Filled 2012-05-08: qty 1

## 2012-05-08 MED ORDER — INSULIN REGULAR HUMAN 100 UNIT/ML IJ SOLN: 2.0000 [IU] | Freq: Once | INTRAMUSCULAR | Status: DC

## 2012-05-08 NOTE — ED Provider Notes (Signed)
History     CSN: [id number]  Arrival date & time 05/08/12  1615   First MD Initiated Contact with Patient 05/08/12 1623      Chief Complaint  Patient presents with  . Hyperglycemia     HPI Pt was seen at 1635.   Per pt, c/o gradual onset and persistence of constant "high" home CBG's for the past 2 days.  Pt states she has not taken her insulin in 2 days due to financial issues.  States she was having polyuria and polydipsia, became concerned "about going into DKA."  EMS noted her CBG was 289 on their arrival to the scene.  Denies CP/palpitations, no SOB/cough, no abd pain, no N/V/D, no fevers.     Past Medical History  Diagnosis Date  . Diabetes mellitus   . Asthma     History reviewed. No pertinent past surgical history.   History  Substance Use Topics  . Smoking status: Passive Smoke Exposure - Never Smoker  . Smokeless tobacco: Not on file  . Alcohol Use: No    Review of Systems ROS: Statement: All systems negative except as marked or noted in the HPI; Constitutional: Negative for fever and chills. ; ; Eyes: Negative for eye pain, redness and discharge. ; ; ENMT: Negative for ear pain, hoarseness, nasal congestion, sinus pressure and sore throat. ; ; Cardiovascular: Negative for chest pain, palpitations, diaphoresis, dyspnea and peripheral edema. ; ; Respiratory: Negative for cough, wheezing and stridor. ; ; Gastrointestinal: Negative for nausea, vomiting, diarrhea, abdominal pain, blood in stool, hematemesis, jaundice and rectal bleeding. . ; ; Genitourinary: Negative for dysuria, flank pain and hematuria. ; ; Musculoskeletal: Negative for back pain and neck pain. Negative for swelling and trauma.; ; Skin: Negative for pruritus, rash, abrasions, blisters, bruising and skin lesion.; ; Neuro: Negative for headache, lightheadedness and neck stiffness. Negative for weakness, altered level of consciousness , altered mental status, extremity weakness, paresthesias, involuntary  movement, seizure and syncope.     Allergies  Review of patient's allergies indicates no known allergies.  Home Medications   Current Outpatient Rx  Name  Route  Sig  Dispense  Refill  . INSULIN GLARGINE 100 UNIT/ML [place] SOLN   Subcutaneous   Inject 40 Units into the skin at bedtime.   10 mL   0   . INSULIN GLULISINE 100 UNIT/ML [place] SOLN   Subcutaneous   Inject 0-50 Units into the skin 3 (three) times daily before meals. 9u/1 carb ratio + CBG > 180 = 1u           BP 125/70  Pulse 98  Temp 98.8 F (37.1 C) (Oral)  Resp 20  SpO2 100%  Physical Exam 1640: Physical examination:  Nursing notes reviewed; Vital signs and O2 SAT reviewed;  Constitutional: Well developed, Well nourished, Well hydrated, In no acute distress; Head:  Normocephalic, atraumatic; Eyes: EOMI, PERRL, No scleral icterus; ENMT: Mouth and pharynx normal, Mucous membranes moist; Neck: Supple, Full range of motion, No lymphadenopathy; Cardiovascular: Regular rate and rhythm, No murmur, rub, or gallop; Respiratory: Breath sounds clear & equal bilaterally, No rales, rhonchi, wheezes.  Speaking full sentences with ease, Normal respiratory effort/excursion; Chest: Nontender, Movement normal; Abdomen: Soft, Nontender, Nondistended, Normal bowel sounds; Genitourinary: No CVA tenderness; Extremities: Pulses normal, No tenderness, No edema, No calf edema or asymmetry.; Neuro: AA&Ox3, Major CN grossly intact.  Speech clear. Climbs on and off stretcher easily by herself. Gait steady. No gross focal motor or sensory deficits in extremities.;  Skin: Color normal, Warm, Dry.   ED Course  Procedures   1710:  Social Worker has been in to see pt:  Pt has active medicaid and therefore can obtain her meds, she explained this to pt.   2150:  Pt wants to go home now.  She has gotten herself dressed, called for her ride and wants her IV out. Has been given IVF and insulin SQ while in the ED. Has eaten 2 meals while in the ED without N/V.   Pt now states to multiple staff she has her insulin at home; she "just didn't have food to eat" so she came to the ED for food to eat. Also states she "thinks my machine is off" and that her home CBG's "really weren't high."  Requesting I dose her SQ nightly lantus now, states she can take her short acting insulin when she gets home.  Not acidotic via ABG.  AG was 17, now on repeat labs after IVF and insulin AG is 10.  I asked her again if she would like me to give her SQ regular insulin and she declined, stating her ride was here and she needs to leave right now. Dx and testing d/w pt.  Questions answered.  Verb understanding, agreeable to d/c home with outpt f/u.      MDM  MDM Reviewed: nursing note, vitals and previous chart Interpretation: labs   Results for orders placed during the hospital encounter of 05/08/12  GLUCOSE, CAPILLARY      Component Value Range   Glucose-Capillary 283 (*) 70 - 99 mg/dL   Comment 1 Documented in Chart    BASIC METABOLIC PANEL      Component Value Range   Sodium 133 (*) 135 - 145 mEq/L   Potassium 3.5  3.5 - 5.1 mEq/L   Chloride 98  96 - 112 mEq/L   CO2 18 (*) 19 - 32 mEq/L   Glucose, Bld 232 (*) 70 - 99 mg/dL   BUN 13  6 - 23 mg/dL   Creatinine, Ser 1.61  0.50 - 1.10 mg/dL   Calcium 09.6  8.4 - 04.5 mg/dL   GFR calc non Af Amer >90  >90 mL/min   GFR calc Af Amer >90  >90 mL/min  CBC WITH DIFFERENTIAL      Component Value Range   WBC 8.1  4.0 - 10.5 K/uL   RBC 4.87  3.87 - 5.11 MIL/uL   Hemoglobin 13.9  12.0 - 15.0 g/dL   HCT 40.9  81.1 - 91.4 %   MCV 84.8  78.0 - 100.0 fL   MCH 28.5  26.0 - 34.0 pg   MCHC 33.7  30.0 - 36.0 g/dL   RDW 78.2  95.6 - 21.3 %   Platelets 336  150 - 400 K/uL   Neutrophils Relative 63  43 - 77 %   Neutro Abs 5.1  1.7 - 7.7 K/uL   Lymphocytes Relative 32  12 - 46 %   Lymphs Abs 2.6  0.7 - 4.0 K/uL   Monocytes Relative 5  3 - 12 %   Monocytes Absolute 0.4  0.1 - 1.0 K/uL   Eosinophils Relative 1  0 - 5 %    Eosinophils Absolute 0.0  0.0 - 0.7 K/uL   Basophils Relative 0  0 - 1 %   Basophils Absolute 0.0  0.0 - 0.1 K/uL  URINALYSIS, ROUTINE W REFLEX MICROSCOPIC      Component Value Range   Color, Urine YELLOW  YELLOW   APPearance CLOUDY (*) CLEAR   Specific Gravity, Urine 1.036 (*) 1.005 - 1.030   pH 5.5  5.0 - 8.0   Glucose, UA >1000 (*) NEGATIVE mg/dL   Hgb urine dipstick NEGATIVE  NEGATIVE   Bilirubin Urine NEGATIVE  NEGATIVE   Ketones, ur >80 (*) NEGATIVE mg/dL   Protein, ur NEGATIVE  NEGATIVE mg/dL   Urobilinogen, UA 0.2  0.0 - 1.0 mg/dL   Nitrite NEGATIVE  NEGATIVE   Leukocytes, UA NEGATIVE  NEGATIVE  PREGNANCY, URINE      Component Value Range   Preg Test, Ur NEGATIVE  NEGATIVE  BLOOD GAS, ARTERIAL      Component Value Range   FIO2 0.21     pH, Arterial 7.385  7.350 - 7.450   pCO2 arterial 27.2 (*) 35.0 - 45.0 mmHg   pO2, Arterial 103.0 (*) 80.0 - 100.0 mmHg   Bicarbonate 15.9 (*) 20.0 - 24.0 mEq/L   TCO2 14.1  0 - 100 mmol/L   Acid-base deficit 7.3 (*) 0.0 - 2.0 mmol/L   O2 Saturation 97.6     Patient temperature 98.6     Collection site RIGHT RADIAL     Drawn by [id number]     Sample type ARTERIAL DRAW     Allens test (pass/fail) PASS  PASS  GLUCOSE, CAPILLARY      Component Value Range   Glucose-Capillary 218 (*) 70 - 99 mg/dL   Comment 1 Documented in Chart    URINE MICROSCOPIC-ADD ON      Component Value Range   Squamous Epithelial / LPF MANY (*) RARE   WBC, UA 0-2  <3 WBC/hpf   RBC / HPF 0-2  <3 RBC/hpf   Bacteria, UA FEW (*) RARE  GLUCOSE, CAPILLARY      Component Value Range   Glucose-Capillary 289 (*) 70 - 99 mg/dL   Comment 1 Documented in Chart    POCT I-STAT, CHEM 8      Component Value Range   Sodium 137  135 - 145 mEq/L   Potassium 3.8  3.5 - 5.1 mEq/L   Chloride 107  96 - 112 mEq/L   BUN 14  6 - 23 mg/dL   Creatinine, Ser 0.45  0.50 - 1.10 mg/dL   Glucose, Bld 409 (*) 70 - 99 mg/dL   Calcium, Ion 8.11  9.14 - 1.23 mmol/L   TCO2 20  0 - 100  mmol/L   Hemoglobin 12.2  12.0 - 15.0 g/dL   HCT 78.2  95.6 - 21.3 %             Laray Anger, DO 05/10/12 2151

## 2012-05-08 NOTE — Progress Notes (Signed)
[facility] ED CM received a call from ED [place] for EDP, [place] to assist with medications.  CM reviewed EPIC notes Pt is Medicaid pt CM confirmed with pt her medicaid is still active Pt informed CM she did not have any concerns getting medications She states she pays $0-3 dollars for medications CM updated EDP CM signing off

## 2012-05-08 NOTE — ED Notes (Signed)
[id number]<YQ> Expected date:<BR> Expected time:<BR> Means of arrival:<BR> Comments:<BR> Hasn&#39;t took insulin in 2days, cbg 289

## 2012-05-08 NOTE — ED Notes (Addendum)
Pt arrives from home by [facility] with c/o high blood sugar. Pt has social issues that has made her not be able to eat or take her insulin and has not had it for 2 days. EMS CBG 289.

## 2012-05-08 NOTE — ED Notes (Signed)
Pt given [place] sandwich and diet sprite per pt request. Dr. Clarene Duke aware.

## 2012-05-08 NOTE — ED Notes (Signed)
Pt states she has not had anything to eat x's 2 days. States her mom took all her money and left no food. Pt reports feeling like she was going into "DKA" reports urinary frequency and rapid heart rate.

## 2012-05-24 ENCOUNTER — Emergency Department (INDEPENDENT_AMBULATORY_CARE_PROVIDER_SITE_OTHER)
Admission: EM | Admit: 2012-05-24 | Discharge: 2012-05-24 | Disposition: A | Discharge status: Home / Self Care | Payer: Self-pay | Source: Home / Self Care | Attending: Family Medicine | Admitting: Family Medicine

## 2012-05-24 ENCOUNTER — Encounter (HOSPITAL_COMMUNITY): Payer: Self-pay | Admitting: Emergency Medicine

## 2012-05-24 DIAGNOSIS — J020 Streptococcal pharyngitis: Secondary | ICD-10-CM

## 2012-05-24 LAB — POCT RAPID STREP A: Streptococcus, Group A Screen (Direct): POSITIVE — AB

## 2012-05-24 MED ORDER — PENICILLIN G BENZATHINE 1200000 UNIT/2ML IM SUSP
1.2000 10*6.[IU] | Freq: Once | INTRAMUSCULAR | Status: AC
  Administered 2012-05-24: 1.2 10*6.[IU] via INTRAMUSCULAR

## 2012-05-24 MED ORDER — PENICILLIN G BENZATHINE 1200000 UNIT/2ML IM SUSP
INTRAMUSCULAR | Status: AC
  Filled 2012-05-24: qty 2

## 2012-05-24 NOTE — ED Notes (Signed)
Patient aware of post injection delay prior to being discharged.   

## 2012-05-24 NOTE — ED Provider Notes (Signed)
History     CSN: [id number]  Arrival date & time 05/24/12  1141   First MD Initiated Contact with Patient 05/24/12 1255      Chief Complaint  Patient presents with  . Sore Throat     Patient is a 22 y.o. female presenting with pharyngitis. The history is provided by the patient.  Sore Throat This is a new problem. The current episode started more than 2 days ago. The problem occurs constantly. The problem has been gradually worsening. Associated symptoms include headaches. Pertinent negatives include no chest pain, no abdominal pain and no shortness of breath. The symptoms are aggravated by swallowing. Nothing relieves the symptoms. She has tried nothing for the symptoms.  Pt reports onset of severe sore throat Thursday night. Difficult to swallow. Denies associated URI sx's or fever.   Past Medical History  Diagnosis Date  . Diabetes mellitus   . Asthma     History reviewed. No pertinent past surgical history.  No family history on file.  History  Substance Use Topics  . Smoking status: Current Every Day Smoker  . Smokeless tobacco: Not on file  . Alcohol Use: No    OB History   Grav Para Term Preterm Abortions TAB SAB Ect Mult Living                  Review of Systems  Constitutional: Negative.   HENT: Positive for sore throat. Negative for ear pain, congestion, facial swelling, rhinorrhea, sneezing and neck stiffness.   Eyes: Negative.   Respiratory: Negative.  Negative for shortness of breath.   Cardiovascular: Negative.  Negative for chest pain.  Gastrointestinal: Negative.  Negative for abdominal pain.  Endocrine: Negative.   Genitourinary: Negative.   Musculoskeletal: Negative.   Skin: Negative.   Allergic/Immunologic: Negative.   Neurological: Positive for headaches.  Hematological: Negative.   Psychiatric/Behavioral: Negative.     Allergies  Review of patient's allergies indicates no known allergies.  Home Medications   Current Outpatient Rx   Name  Route  Sig  Dispense  Refill  . insulin glargine (LANTUS) 100 UNIT/ML injection   Subcutaneous   Inject 40 Units into the skin at bedtime.   10 mL   0   . insulin glulisine (APIDRA) 100 UNIT/ML injection   Subcutaneous   Inject 0-50 Units into the skin 3 (three) times daily before meals. 9u/1 carb ratio + CBG > 180 = 1u           BP 125/80  Pulse 100  Temp(Src) 98.6 F (37 C) (Oral)  SpO2 100%  Physical Exam  Constitutional: She is oriented to person, place, and time. She appears well-developed and well-nourished.  HENT:  Head: Normocephalic and atraumatic.  Right Ear: Tympanic membrane, external ear and ear canal normal.  Left Ear: Tympanic membrane, external ear and ear canal normal.  Nose: Nose normal.  Mouth/Throat: Uvula is midline and mucous membranes are normal. Posterior oropharyngeal edema and posterior oropharyngeal erythema present. No oropharyngeal exudate or tonsillar abscesses.  Eyes: Conjunctivae are normal.  Neck: Neck supple.  Cardiovascular: Normal rate.   Pulmonary/Chest: Effort normal.  Musculoskeletal: Normal range of motion.  Lymphadenopathy:    She has no cervical adenopathy.  Neurological: She is alert and oriented to person, place, and time.  Skin: Skin is warm and dry.  Psychiatric: She has a normal mood and affect.    ED Course  Procedures (including critical care time)  Labs Reviewed  POCT RAPID STREP A ([facility]  URG CARE ONLY) - Abnormal; Notable for the following:    Streptococcus, Group A Screen (Direct) POSITIVE (*)    All other components within normal limits   No results found.   1. Strep pharyngitis       MDM  Sore throat in absence of other URI sx's. Rapid Strep screen positive. Will treat for strep pharyngitis. Pt encouraged to return if symptoms persist or worsen suddenly.         Leanne Chang, NP 05/27/12 [phone]

## 2012-05-24 NOTE — ED Notes (Signed)
Provided diet coke

## 2012-05-24 NOTE — ED Notes (Signed)
Sore throat that started Thursday. Denies cold symptoms.  Reports difficulty swallowing sometimes and has headache.

## 2012-05-24 NOTE — ED Notes (Signed)
Discharge delay secondary to post injection delay.  Patient is aware of policy

## 2012-05-24 NOTE — ED Notes (Signed)
Discharge pending medication orders clarification

## 2012-05-24 NOTE — ED Notes (Signed)
Provided patient with saltines and diet ginger ale

## 2012-05-28 NOTE — ED Provider Notes (Signed)
Medical screening examination/treatment/procedure(s) were performed by resident physician or non-physician practitioner and as supervising physician I was immediately available for consultation/collaboration.   KINDL,JAMES DOUGLAS MD.   James D Kindl, MD 05/28/12 1548 

## 2012-07-21 DIAGNOSIS — E0590 Thyrotoxicosis, unspecified without thyrotoxic crisis or storm: Secondary | ICD-10-CM | POA: Insufficient documentation

## 2012-10-22 ENCOUNTER — Ambulatory Visit: Payer: Self-pay

## 2012-10-23 ENCOUNTER — Ambulatory Visit: Payer: Self-pay

## 2012-11-11 ENCOUNTER — Ambulatory Visit: Payer: Self-pay | Admitting: Family Medicine

## 2012-11-12 ENCOUNTER — Ambulatory Visit: Payer: Self-pay | Attending: Family Medicine | Admitting: Family Medicine

## 2012-11-12 ENCOUNTER — Encounter: Payer: Self-pay | Admitting: Family Medicine

## 2012-11-12 VITALS — BP 124/81 | HR 96 | Temp 98.5°F | Resp 18 | Ht 65.0 in | Wt 134.0 lb

## 2012-11-12 DIAGNOSIS — R739 Hyperglycemia, unspecified: Secondary | ICD-10-CM

## 2012-11-12 DIAGNOSIS — E119 Type 2 diabetes mellitus without complications: Secondary | ICD-10-CM

## 2012-11-12 DIAGNOSIS — R7309 Other abnormal glucose: Secondary | ICD-10-CM

## 2012-11-12 DIAGNOSIS — E109 Type 1 diabetes mellitus without complications: Secondary | ICD-10-CM

## 2012-11-12 LAB — POCT URINALYSIS DIPSTICK
Ketones, UA: 80
Protein, UA: NEGATIVE
Spec Grav, UA: 1.015

## 2012-11-12 MED ORDER — INSULIN ASPART 100 UNIT/ML ~~LOC~~ SOLN: SUBCUTANEOUS | Status: DC

## 2012-11-12 MED ORDER — INSULIN REGULAR HUMAN 100 UNIT/ML IJ SOLN: INTRAMUSCULAR | Status: DC

## 2012-11-12 MED ORDER — INSULIN ASPART 100 UNIT/ML ~~LOC~~ SOLN
10.0000 [IU] | Freq: Once | SUBCUTANEOUS | Status: AC
  Administered 2012-11-12: 10 [IU] via SUBCUTANEOUS

## 2012-11-12 MED ORDER — INSULIN NPH (HUMAN) (ISOPHANE) 100 UNIT/ML ~~LOC~~ SUSP: 40.0000 [IU] | Freq: Every day | SUBCUTANEOUS | Status: DC

## 2012-11-12 NOTE — Patient Instructions (Signed)
Hypoglycemia (Low Blood Sugar) Hypoglycemia is when the glucose (sugar) in your blood is too low. Hypoglycemia can happen for many reasons. It can happen to people with or without diabetes. Hypoglycemia can develop quickly and can be a medical emergency.  CAUSES  Having hypoglycemia does not mean that you will develop diabetes. Different causes include:  Missed or delayed meals or not enough carbohydrates eaten.  Medication overdose. This could be by accident or deliberate. If by accident, your medication may need to be adjusted or changed.  Exercise or increased activity without adjustments in carbohydrates or medications.  A nerve disorder that affects body functions like your heart rate, blood pressure and digestion (autonomic neuropathy).  A condition where the stomach muscles do not function properly (gastroparesis). Therefore, medications may not absorb properly.  The inability to recognize the signs of hypoglycemia (hypoglycemic unawareness).  Absorption of insulin  may be altered.  Alcohol consumption.  Pregnancy/menstrual cycles/postpartum. This may be due to hormones.  Certain kinds of tumors. This is very rare. SYMPTOMS   Sweating.  Hunger.  Dizziness.  Blurred vision.  Drowsiness.  Weakness.  Headache.  Rapid heart beat.  Shakiness.  Nervousness. DIAGNOSIS  Diagnosis is made by monitoring blood glucose in one or all of the following ways:  Fingerstick blood glucose monitoring.  Laboratory results. TREATMENT  If you think your blood glucose is low:  Check your blood glucose, if possible. If it is less than 70 mg/dl, take one of the following:  3-4 glucose tablets.   cup juice (prefer clear like apple).   cup "regular" soda pop.  1 cup milk.  -1 tube of glucose gel.  5-6 hard candies.  Do not over treat because your blood glucose (sugar) will only go too high.  Wait 15 minutes and recheck your blood glucose. If it is still less than  70 mg/dl (or below your target range), repeat treatment.  Eat a snack if it is more than one hour until your next meal. Sometimes, your blood glucose may go so low that you are unable to treat yourself. You may need someone to help you. You may even pass out or be unable to swallow. This may require you to get an injection of glucagon, which raises the blood glucose. HOME CARE INSTRUCTIONS  Check blood glucose as recommended by your caregiver.  Take medication as prescribed by your caregiver.  Follow your meal plan. Do not skip meals. Eat on time.  If you are going to drink alcohol, drink it only with meals.  Check your blood glucose before driving.  Check your blood glucose before and after exercise. If you exercise longer or different than usual, be sure to check blood glucose more frequently.  Always carry treatment with you. Glucose tablets are the easiest to carry.  Always wear medical alert jewelry or carry some form of identification that states that you have diabetes. This will alert people that you have diabetes. If you have hypoglycemia, they will have a better idea on what to do. SEEK MEDICAL CARE IF:   You are having problems keeping your blood sugar at target range.  You are having frequent episodes of hypoglycemia.  You feel you might be having side effects from your medicines.  You have symptoms of an illness that is not improving after 3-4 days.  You notice a change in vision or a new problem with your vision. SEEK IMMEDIATE MEDICAL CARE IF:   You are a family member or friend of a   person whose blood glucose goes below 70 mg/dl and is accompanied by:  Confusion.  A change in mental status.  The inability to swallow.  Passing out. Document Released: 04/02/2005 Document Revised: 06/25/2011 Document Reviewed: 07/30/2011 [facility] Patient Information 2014 [place], [place]. Blood Sugar Monitoring, Adult GLUCOSE METERS FOR SELF-MONITORING OF BLOOD GLUCOSE  It is  important to be able to correctly measure your blood sugar (glucose). You can use a blood glucose monitor (a small battery-operated device) to check your glucose level at any time. This allows you and your caregiver to monitor your diabetes and to determine how well your treatment plan is working. The process of monitoring your blood glucose with a glucose meter is called self-monitoring of blood glucose (SMBG). When people with diabetes control their blood sugar, they have better health. To test for glucose with a typical glucose meter, place the disposable strip in the meter. Then place a small sample of blood on the "test strip." The test strip is coated with chemicals that combine with glucose in blood. The meter measures how much glucose is present. The meter displays the glucose level as a number. Several new models can record and store a number of test results. Some models can connect to personal computers to store test results or print them out.  Newer meters are often easier to use than older models. Some meters allow you to get blood from places other than your fingertip. Some new models have automatic timing, error codes, signals, or barcode readers to help with proper adjustment (calibration). Some meters have a large display screen or spoken instructions for people with visual impairments.  INSTRUCTIONS FOR USING GLUCOSE METERS  Wash your hands with soap and warm water, or clean the area with alcohol. Dry your hands completely.  Prick the side of your fingertip with a lancet (a sharp-pointed tool used by hand).  Hold the hand down and gently milk the finger until a small drop of blood appears. Catch the blood with the test strip.  Follow the instructions for inserting the test strip and using the SMBG meter. Most meters require the meter to be turned on and the test strip to be inserted before applying the blood sample.  Record the test result.  Read the instructions carefully for both  the meter and the test strips that go with it. Meter instructions are found in the user manual. Keep this manual to help you solve any problems that may arise. Many meters use "error codes" when there is a problem with the meter, the test strip, or the blood sample on the strip. You will need the manual to understand these error codes and fix the problem.  New devices are available such as laser lancets and meters that can test blood taken from "alternative sites" of the body, other than fingertips. However, you should use standard fingertip testing if your glucose changes rapidly. Also, use standard testing if:  You have eaten, exercised, or taken insulin in the past 2 hours.  You think your glucose is low.  You tend to not feel symptoms of low blood glucose (hypoglycemia).  You are ill or under stress.  Clean the meter as directed by the manufacturer.  Test the meter for accuracy as directed by the manufacturer.  Take your meter with you to your caregiver's office. This way, you can test your glucose in front of your caregiver to make sure you are using the meter correctly. Your caregiver can also take a sample of blood  to test using a routine lab method. If values on the glucose meter are close to the lab results, you and your caregiver will see that your meter is working well and you are using good technique. Your caregiver will advise you about what to do if the results do not match. FREQUENCY OF TESTING  Your caregiver will tell you how often you should check your blood glucose. This will depend on your type of diabetes, your current level of diabetes control, and your types of medicines. The following are general guidelines, but your care plan may be different. Record all your readings and the time of day you took them for review with your caregiver.   Diabetes type 1.  When you are using insulin with good diabetic control (either multiple daily injections or via a pump), you should  check your glucose 4 times a day.  If your diabetes is not well controlled, you may need to monitor more frequently, including before meals and 2 hours after meals, at bedtime, and occasionally between 2 a.m. and 3 a.m.  You should always check your glucose before a dose of insulin or before changing the rate on your insulin pump.  Diabetes type 2.  Guidelines for SMBG in diabetes type 2 are not as well defined.  If you are on insulin, follow the guidelines above.  If you are on medicines, but not insulin, and your glucose is not well controlled, you should test at least twice daily.  If you are not on insulin, and your diabetes is controlled with medicines or diet alone, you should test at least once daily, usually before breakfast.  A weekly profile will help your caregiver advise you on your care plan. The week before your visit, check your glucose before a meal and 2 hours after a meal at least daily. You may want to test before and after a different meal each day so you and your caregiver can tell how well controlled your blood sugars are throughout the course of a 24 hour period.  Gestational diabetes (diabetes during pregnancy).  Frequent testing is often necessary. Accurate timing is important.  If you are not on insulin, check your glucose 4 times a day. Check it before breakfast and 1 hour after the start of each meal.  If you are on insulin, check your glucose 6 times a day. Check it before each meal and 1 hour after the first bite of each meal.  General guidelines.  More frequent testing is required at the start of insulin treatment. Your caregiver will instruct you.  Test your glucose any time you suspect you have low blood sugar (hypoglycemia).  You should test more often when you change medicines, when you have unusual stress or illness, or in other unusual circumstances. OTHER THINGS TO KNOW ABOUT GLUCOSE METERS  Measurement Range. Most glucose meters are able to  read glucose levels over a broad range of values from as low as 0 to as high as 600 mg/dL. If you get an extremely high or low reading from your meter, you should first confirm it with another reading. Report very high or very low readings to your caregiver.  Whole Blood Glucose versus Plasma Glucose. Some older home glucose meters measure glucose in your whole blood. In a lab or when using some newer home glucose meters, the glucose is measured in your plasma (one component of blood). The difference can be important. It is important for you and your caregiver to know whether  your meter gives its results as "whole blood equivalent" or "plasma equivalent."  Display of High and Low Glucose Values. Part of learning how to operate a meter is understanding what the meter results mean. Know how high and low glucose concentrations are displayed on your meter.  Factors that Affect Glucose Meter Performance. The accuracy of your test results depends on many factors and varies depending on the brand and type of meter. These factors include:  Low red blood cell count (anemia).  Substances in your blood (such as uric acid, vitamin C, and others).  Environmental factors (temperature, humidity, altitude).  Name-brand versus generic test strips.  Calibration. Make sure your meter is set up properly. It is a good idea to do a calibration test with a control solution recommended by the manufacturer of your meter whenever you begin using a fresh bottle of test strips. This will help verify the accuracy of your meter.  Improperly stored, expired, or defective test strips. Keep your strips in a dry place with the lid on.  Soiled meter.  Inadequate blood sample. NEW TECHNOLOGIES FOR GLUCOSE TESTING Alternative site testing Some glucose meters allow testing blood from alternative sites. These include the:  Upper arm.  Forearm.  Base of the thumb.  Thigh. Sampling blood from alternative sites may be  desirable. However, it may have some limitations. Blood in the fingertips show changes in glucose levels more quickly than blood in other parts of the body. This means that alternative site test results may be different from fingertip test results, not because of the meter's ability to test accurately, but because the actual glucose concentration can be different.  Continuous Glucose Monitoring Devices to measure your blood glucose continuously are available, and others are in development. These methods can be more expensive than self-monitoring with a glucose meter. However, it is uncertain how effective and reliable these devices are. Your caregiver will advise you if this approach makes sense for you. IF BLOOD SUGARS ARE CONTROLLED, PEOPLE WITH DIABETES REMAIN HEALTHIER.  SMBG is an important part of the treatment plan of patients with diabetes mellitus. Below are reasons for using SMBG:   It confirms that your glucose is at a specific, healthy level.  It detects hypoglycemia and severe hyperglycemia.  It allows you and your caregiver to make adjustments in response to changes in lifestyle for individuals requiring medicine.  It determines the need for starting insulin therapy in temporary diabetes that happens during pregnancy (gestational diabetes). Document Released: 04/05/2003 Document Revised: 06/25/2011 Document Reviewed: 07/27/2010 [facility] Patient Information 2014 [place], [place].

## 2012-11-12 NOTE — Progress Notes (Signed)
Patient ID: [name], female   DOB: [birth year], 22 y.o.   MRN: [id number]  CC: Establish Care   HPI: Patient presents to the clinic because she is out of her insulin since yesterday. Her current regimen includes Lantus 40 units at night and Novalog after counting carbs.  Patient states she doesn't  eat regular meals and rather snacks, therefore, she doesn't take her postprandial insulin.  Patient educated that she  should be giving herself about 4 injections per day. Per Patient, A1C is usually around 11.  She follows up with endocrinology, Dr.  Maple Hudson,  and was on the pump but is no longer on it due to financial reasons. Blood glucose today is 452, and we will give insulin injection in the clinic today.  Patient educated about importance of not running out of her insulin, and that once she obtains her orange card, she should be able to have insulin.    No Known Allergies Past Medical History  Diagnosis Date  . Diabetes mellitus   . Asthma    Current Outpatient Prescriptions on File Prior to Visit  Medication Sig Dispense Refill  . insulin glargine (LANTUS) 100 UNIT/ML injection Inject 40 Units into the skin at bedtime.  10 mL  0  . insulin glulisine (APIDRA) 100 UNIT/ML injection Inject 0-50 Units into the skin 3 (three) times daily before meals. 9u/1 carb ratio + CBG > 180 = 1u       No current facility-administered medications on file prior to visit.   History reviewed. No pertinent family history. History   Social History  . Marital Status: Single    Spouse Name: N/A    Number of Children: N/A  . Years of Education: N/A   Occupational History  . Not on file.   Social History Main Topics  . Smoking status: Former Games developer  . Smokeless tobacco: Not on file  . Alcohol Use: No  . Drug Use: No  . Sexually Active: Yes   Other Topics Concern  . Not on file   Social History Narrative  . No narrative on file    Review of Systems  Constitutional: Negative for fever,  chills, diaphoresis, activity change, appetite change and fatigue.  HENT: Negative for ear pain, nosebleeds, congestion, facial swelling, rhinorrhea, neck pain, neck stiffness and ear discharge.   Eyes: Negative for pain, discharge, redness, itching and visual disturbance.  Respiratory: Negative for cough, choking, chest tightness, shortness of breath, wheezing and stridor.   Cardiovascular: Negative for chest pain, palpitations and leg swelling.  Gastrointestinal: Negative for abdominal distention.  Genitourinary: Negative for dysuria, urgency, frequency, hematuria, flank pain, decreased urine volume, difficulty urinating and dyspareunia.  Musculoskeletal: Negative for back pain, joint swelling, arthralgias and gait problem.  Neurological: Negative for dizziness, tremors, seizures, syncope, facial asymmetry, speech difficulty, weakness, light-headedness, numbness and headaches.  Hematological: Negative for adenopathy. Does not bruise/bleed easily.  Psychiatric/Behavioral: Negative for hallucinations, behavioral problems, confusion, dysphoric mood, decreased concentration and agitation.    Objective:   Filed Vitals:   11/12/12 1149  BP: 124/81  Pulse: 96  Temp: 98.5 F (36.9 C)  Resp: 18    Physical Exam  Constitutional: Appears well-developed and well-nourished. No distress.  HENT: Normocephalic. External right and left ear normal. Oropharynx is clear and moist.  Eyes: Conjunctivae and EOM are normal. PERRLA, no scleral icterus.  Neck: Normal ROM. Neck supple. No JVD. No tracheal deviation. No thyromegaly.  CVS: RRR, S1/S2 +, no murmurs, no gallops, no carotid bruit.  Pulmonary: Effort and breath sounds normal, no stridor, rhonchi, wheezes, rales.  Abdominal: Soft. BS +,  no distension, tenderness, rebound or guarding.  Musculoskeletal: Normal range of motion. No edema and no tenderness.  Lymphadenopathy: No lymphadenopathy noted, cervical, inguinal. Neuro: Alert. Normal reflexes,  muscle tone coordination. No cranial nerve deficit. Skin: Skin is warm and dry. No rash noted. Not diaphoretic. No erythema. No pallor.  Psychiatric: Normal mood and affect. Behavior, judgment, thought content normal.   Lab Results  Component Value Date   WBC 8.1 05/08/2012   HGB 12.2 05/08/2012   HCT 36.0 05/08/2012   MCV 84.8 05/08/2012   PLT 336 05/08/2012   Lab Results  Component Value Date   CREATININE 0.50 05/08/2012   BUN 14 05/08/2012   NA 137 05/08/2012   K 3.8 05/08/2012   CL 107 05/08/2012   CO2 18* 05/08/2012    Lab Results  Component Value Date   HGBA1C  Value: 12.5 (NOTE)                                                                       According to the ADA Clinical Practice Recommendations for 2011, when HbA1c is used as a screening test:   >=6.5%   Diagnostic of Diabetes Mellitus           (if abnormal result  is confirmed)  5.7-6.4%   Increased risk of developing Diabetes Mellitus  References:Diagnosis and Classification of Diabetes Mellitus,Diabetes Care,2011,34(Suppl 1):S62-S69 and Standards of Medical Care in         Diabetes - 2011,Diabetes Care,2011,34  (Suppl 1):S11-S61.* 09/28/2009   Lipid Panel     Component Value Date/Time   CHOL  Value: 221        ATP III CLASSIFICATION:  <200     mg/dL   Desirable  960-454  mg/dL   Borderline High  >=098    mg/dL   High       * 05/04/1476 0400   TRIG 117 09/03/2009 0400   HDL 69 09/03/2009 0400   CHOLHDL 3.2 09/03/2009 0400   VLDL 23 09/03/2009 0400   LDLCALC  Value: 129        Total Cholesterol/HDL:CHD Risk Coronary Heart Disease Risk Table                     Men   Women  1/2 Average Risk   3.4   3.3  Average Risk       5.0   4.4  2 X Average Risk   9.6   7.1  3 X Average Risk  23.4   11.0        Use the calculated Patient Ratio above and the CHD Risk Table to determine the patient's CHD Risk.        ATP III CLASSIFICATION (LDL):  <100     mg/dL   Optimal  295-621  mg/dL   Near or Above                    Optimal  130-159  mg/dL    Borderline  308-657  mg/dL   High  >846     mg/dL   Very High* 9/62/9528 0400  Assessment and plan:   Patient Active Problem List   Diagnosis Date Noted  . DKA (diabetic ketoacidoses) 04/29/2011  . Noncompliance 04/29/2011   Blood sugar was 452 10 units of novolog given in office today  Rx for cheaper insulin from [facility] given for Relion R and Relion N to continue to use at current home doses Continue monitoring BS closely  Hypoglycemia precautions given  The patient was given clear instructions to go to ER or return to medical center if symptoms don't improve, worsen or new problems develop.  The patient verbalized understanding.  The patient was told to call to get any lab results if not heard anything in the next week.    RTC in 1 month  Rodney Langton, MD, CDE, FAAFP [facility] [place], [place]

## 2012-11-12 NOTE — Progress Notes (Signed)
Presents to establish care; states she needs refills of insulin; used to have orange card; states she is also having irregular menstrual cycles.

## 2012-11-13 DIAGNOSIS — E109 Type 1 diabetes mellitus without complications: Secondary | ICD-10-CM | POA: Insufficient documentation

## 2012-11-13 DIAGNOSIS — R739 Hyperglycemia, unspecified: Secondary | ICD-10-CM | POA: Insufficient documentation

## 2012-11-26 ENCOUNTER — Ambulatory Visit: Payer: Self-pay | Attending: Family Medicine | Admitting: Internal Medicine

## 2012-11-26 ENCOUNTER — Encounter: Payer: Self-pay | Admitting: Internal Medicine

## 2012-11-26 VITALS — BP 110/75 | HR 100 | Temp 98.3°F | Resp 16 | Wt 137.0 lb

## 2012-11-26 DIAGNOSIS — N920 Excessive and frequent menstruation with regular cycle: Secondary | ICD-10-CM | POA: Insufficient documentation

## 2012-11-26 DIAGNOSIS — E1065 Type 1 diabetes mellitus with hyperglycemia: Secondary | ICD-10-CM

## 2012-11-26 DIAGNOSIS — Z91199 Patient's noncompliance with other medical treatment and regimen due to unspecified reason: Secondary | ICD-10-CM | POA: Insufficient documentation

## 2012-11-26 DIAGNOSIS — E1010 Type 1 diabetes mellitus with ketoacidosis without coma: Secondary | ICD-10-CM | POA: Insufficient documentation

## 2012-11-26 DIAGNOSIS — N921 Excessive and frequent menstruation with irregular cycle: Secondary | ICD-10-CM | POA: Insufficient documentation

## 2012-11-26 DIAGNOSIS — Z9119 Patient's noncompliance with other medical treatment and regimen: Secondary | ICD-10-CM | POA: Insufficient documentation

## 2012-11-26 DIAGNOSIS — Z3046 Encounter for surveillance of implantable subdermal contraceptive: Secondary | ICD-10-CM | POA: Insufficient documentation

## 2012-11-26 DIAGNOSIS — E108 Type 1 diabetes mellitus with unspecified complications: Secondary | ICD-10-CM | POA: Insufficient documentation

## 2012-11-26 DIAGNOSIS — IMO0002 Reserved for concepts with insufficient information to code with codable children: Secondary | ICD-10-CM | POA: Insufficient documentation

## 2012-11-26 LAB — GLUCOSE, POCT (MANUAL RESULT ENTRY): POC Glucose: 182 mg/dl — AB (ref 70–99)

## 2012-11-26 NOTE — Progress Notes (Signed)
Patient ID: [name], female   DOB: [birth year], 22 y.o.   MRN: [id number]  CC: Followup  HPI: Patient was in the clinic today for followup visit. She is a known type 1 diabetes with poor glycemic control due to noncompliance. Patient says she just got her insulin and she is ready to be compliant with medication and blood sugar control. Her blood sugar today was 180 in the clinic.  She has a Norplant in her right hand, she noticed is that in the past 3 weeks her usual menstrual period has refused to stop that she thinks is because it is time to take the Norplant out which has been there for 3 years or more. No abdominal pain, no headache. She is aware of hypoglycemic precautions. She continues to drink soda. No Known Allergies Past Medical History  Diagnosis Date  . Asthma   . Diabetes mellitus     type 1    Current Outpatient Prescriptions on File Prior to Visit  Medication Sig Dispense Refill  . insulin aspart (NOVOLOG) 100 UNIT/ML injection Inject into the skin 3 (three) times daily with meals.      . insulin NPH (NOVOLIN N RELION) 100 UNIT/ML injection Inject 40 Units into the skin at bedtime.  1 vial  6  . insulin aspart (NOVOLOG) 100 UNIT/ML injection Use as Directed  2 vial  PRN  . insulin regular (NOVOLIN R RELION) 100 units/mL injection Use As Directed  10 mL  6   No current facility-administered medications on file prior to visit.   History reviewed. No pertinent family history. History   Social History  . Marital Status: Single    Spouse Name: N/A    Number of Children: N/A  . Years of Education: N/A   Occupational History  . Not on file.   Social History Main Topics  . Smoking status: Former Games developer  . Smokeless tobacco: Not on file  . Alcohol Use: No  . Drug Use: No  . Sexual Activity: Yes   Other Topics Concern  . Not on file   Social History Narrative  . No narrative on file    Review of Systems: Constitutional: Negative for fever, chills,  diaphoresis, activity change, appetite change and fatigue. HENT: Negative for ear pain, nosebleeds, congestion, facial swelling, rhinorrhea, neck pain, neck stiffness and ear discharge.  Eyes: Negative for pain, discharge, redness, itching and visual disturbance. Respiratory: Negative for cough, choking, chest tightness, shortness of breath, wheezing and stridor.  Cardiovascular: Negative for chest pain, palpitations and leg swelling. Gastrointestinal: Negative for abdominal distention. Genitourinary: Negative for dysuria, urgency, frequency, hematuria, flank pain, decreased urine volume, difficulty urinating and dyspareunia.  Musculoskeletal: Negative for back pain, joint swelling, arthralgias and gait problem. Neurological: Negative for dizziness, tremors, seizures, syncope, facial asymmetry, speech difficulty, weakness, light-headedness, numbness and headaches.  Hematological: Negative for adenopathy. Does not bruise/bleed easily. Psychiatric/Behavioral: Negative for hallucinations, behavioral problems, confusion, dysphoric mood, decreased concentration and agitation.    Objective:   Filed Vitals:   11/26/12 1308  BP: 110/75  Pulse: 100  Temp: 98.3 F (36.8 C)  Resp: 16    Physical Exam: Constitutional: Patient appears well-developed and well-nourished. No distress. HENT: Normocephalic, atraumatic, External right and left ear normal. Oropharynx is clear and moist.  Eyes: Conjunctivae and EOM are normal. PERRLA, no scleral icterus. Neck: Normal ROM. Neck supple. No JVD. No tracheal deviation. No thyromegaly. CVS: RRR, S1/S2 +, no murmurs, no gallops, no carotid bruit.  Pulmonary: Effort and breath  sounds normal, no stridor, rhonchi, wheezes, rales.  Abdominal: Soft. BS +,  no distension, tenderness, rebound or guarding.  Musculoskeletal: Normal range of motion. No edema and no tenderness.  Lymphadenopathy: No lymphadenopathy noted, cervical, inguinal or axillary Neuro: Alert.  Normal reflexes, muscle tone coordination. No cranial nerve deficit. Skin: Skin is warm and dry. No rash noted. Not diaphoretic. No erythema. No pallor. Psychiatric: Normal mood and affect. Behavior, judgment, thought content normal.  Lab Results  Component Value Date   WBC 8.1 05/08/2012   HGB 12.2 05/08/2012   HCT 36.0 05/08/2012   MCV 84.8 05/08/2012   PLT 336 05/08/2012   Lab Results  Component Value Date   CREATININE 0.50 05/08/2012   BUN 14 05/08/2012   NA 137 05/08/2012   K 3.8 05/08/2012   CL 107 05/08/2012   CO2 18* 05/08/2012    Lab Results  Component Value Date   HGBA1C 13.0 11/26/2012   Lipid Panel     Component Value Date/Time   CHOL  Value: 221        ATP III CLASSIFICATION:  <200     mg/dL   Desirable  161-096  mg/dL   Borderline High  >=045    mg/dL   High       * 07/23/8117 0400   TRIG 117 09/03/2009 0400   HDL 69 09/03/2009 0400   CHOLHDL 3.2 09/03/2009 0400   VLDL 23 09/03/2009 0400   LDLCALC  Value: 129        Total Cholesterol/HDL:CHD Risk Coronary Heart Disease Risk Table                     Men   Women  1/2 Average Risk   3.4   3.3  Average Risk       5.0   4.4  2 X Average Risk   9.6   7.1  3 X Average Risk  23.4   11.0        Use the calculated Patient Ratio above and the CHD Risk Table to determine the patient's CHD Risk.        ATP III CLASSIFICATION (LDL):  <100     mg/dL   Optimal  147-829  mg/dL   Near or Above                    Optimal  130-159  mg/dL   Borderline  562-130  mg/dL   High  >865     mg/dL   Very High* 7/84/6962 0400   Hemoglobin A1c today is 13%    Assessment and plan:   Patient Active Problem List   Diagnosis Date Noted  . Type I (juvenile type) diabetes mellitus with unspecified complication, uncontrolled 11/26/2012  . Menorrhagia with irregular cycle 11/26/2012  . Encounter for Norplant removal 11/26/2012  . Hyperglycemia 11/13/2012  . Type 1 diabetes mellitus 11/13/2012  . DKA (diabetic ketoacidoses) 04/29/2011  . Noncompliance  04/29/2011   Gynecologist referral for Norplant removal Discussed with patient the importance of blood sugar control, hypoglycemic precaution also emphasized Continue present insulin regimen as prescribed Do not smoke cigarette, do not drink alcohol.  [name] was given clear instructions to go to ER or return to the clinic if symptoms don't improve, worsen or new problems develop.  [name] verbalized understanding.  [name] was told to call to get lab results if hasn't heard anything in the next week.  Jeanann Lewandowsky, MD [facility] [place], [place] [phone]   11/26/2012, 1:42 PM

## 2012-11-26 NOTE — Patient Instructions (Addendum)

## 2012-11-26 NOTE — Progress Notes (Signed)
Pt here with c/o uncontrolled blood sugars since last visit 11/12/12. Last CBG range 180-240 with sliding scale.no sx's reported at this time. Vss. CBG 182

## 2012-11-27 ENCOUNTER — Encounter: Payer: Self-pay | Admitting: Internal Medicine

## 2012-12-04 ENCOUNTER — Encounter: Payer: Self-pay | Admitting: Family Medicine

## 2012-12-17 ENCOUNTER — Encounter: Payer: Self-pay | Admitting: *Deleted

## 2013-01-26 ENCOUNTER — Ambulatory Visit: Payer: Self-pay

## 2013-01-29 ENCOUNTER — Encounter: Payer: Self-pay | Admitting: Family Medicine

## 2013-04-10 ENCOUNTER — Encounter (HOSPITAL_COMMUNITY): Payer: Self-pay | Admitting: Emergency Medicine

## 2013-04-10 ENCOUNTER — Inpatient Hospital Stay (HOSPITAL_COMMUNITY)
Admission: EM | Admit: 2013-04-10 | Discharge: 2013-04-14 | DRG: 638 | Disposition: A | Discharge status: Home / Self Care | Payer: Self-pay | Attending: Internal Medicine | Admitting: Internal Medicine

## 2013-04-10 DIAGNOSIS — R1013 Epigastric pain: Secondary | ICD-10-CM

## 2013-04-10 DIAGNOSIS — E1065 Type 1 diabetes mellitus with hyperglycemia: Secondary | ICD-10-CM

## 2013-04-10 DIAGNOSIS — N921 Excessive and frequent menstruation with irregular cycle: Secondary | ICD-10-CM

## 2013-04-10 DIAGNOSIS — Z794 Long term (current) use of insulin: Secondary | ICD-10-CM

## 2013-04-10 DIAGNOSIS — R1110 Vomiting, unspecified: Secondary | ICD-10-CM | POA: Diagnosis present

## 2013-04-10 DIAGNOSIS — N390 Urinary tract infection, site not specified: Secondary | ICD-10-CM | POA: Diagnosis present

## 2013-04-10 DIAGNOSIS — Z3046 Encounter for surveillance of implantable subdermal contraceptive: Secondary | ICD-10-CM

## 2013-04-10 DIAGNOSIS — E872 Acidosis: Secondary | ICD-10-CM

## 2013-04-10 DIAGNOSIS — K3184 Gastroparesis: Secondary | ICD-10-CM | POA: Diagnosis present

## 2013-04-10 DIAGNOSIS — Z9119 Patient's noncompliance with other medical treatment and regimen: Secondary | ICD-10-CM

## 2013-04-10 DIAGNOSIS — Z87891 Personal history of nicotine dependence: Secondary | ICD-10-CM

## 2013-04-10 DIAGNOSIS — E1010 Type 1 diabetes mellitus with ketoacidosis without coma: Principal | ICD-10-CM | POA: Diagnosis present

## 2013-04-10 DIAGNOSIS — IMO0002 Reserved for concepts with insufficient information to code with codable children: Secondary | ICD-10-CM

## 2013-04-10 DIAGNOSIS — R112 Nausea with vomiting, unspecified: Secondary | ICD-10-CM

## 2013-04-10 DIAGNOSIS — E876 Hypokalemia: Secondary | ICD-10-CM | POA: Diagnosis present

## 2013-04-10 DIAGNOSIS — E8729 Other acidosis: Secondary | ICD-10-CM

## 2013-04-10 DIAGNOSIS — Z87440 Personal history of urinary (tract) infections: Secondary | ICD-10-CM

## 2013-04-10 DIAGNOSIS — E1110 Type 2 diabetes mellitus with ketoacidosis without coma: Secondary | ICD-10-CM

## 2013-04-10 DIAGNOSIS — E109 Type 1 diabetes mellitus without complications: Secondary | ICD-10-CM

## 2013-04-10 DIAGNOSIS — Z91199 Patient's noncompliance with other medical treatment and regimen due to unspecified reason: Secondary | ICD-10-CM

## 2013-04-10 DIAGNOSIS — J45909 Unspecified asthma, uncomplicated: Secondary | ICD-10-CM | POA: Diagnosis present

## 2013-04-10 DIAGNOSIS — R739 Hyperglycemia, unspecified: Secondary | ICD-10-CM

## 2013-04-10 LAB — CBC WITH DIFFERENTIAL/PLATELET
Hemoglobin: 14.6 g/dL (ref 12.0–15.0)
Lymphocytes Relative: 12 % (ref 12–46)
Lymphs Abs: 1.6 10*3/uL (ref 0.7–4.0)
Neutrophils Relative %: 84 % — ABNORMAL HIGH (ref 43–77)
Platelets: 322 10*3/uL (ref 150–400)
RBC: 4.8 MIL/uL (ref 3.87–5.11)
WBC: 13 10*3/uL — ABNORMAL HIGH (ref 4.0–10.5)

## 2013-04-10 LAB — COMPREHENSIVE METABOLIC PANEL
ALT: 7 U/L (ref 0–35)
Alkaline Phosphatase: 120 U/L — ABNORMAL HIGH (ref 39–117)
CO2: 9 mEq/L — CL (ref 19–32)
Chloride: 89 mEq/L — ABNORMAL LOW (ref 96–112)
GFR calc Af Amer: >90 mL/min (ref >90)
GFR calc non Af Amer: >90 mL/min (ref >90)
Glucose, Bld: 508 mg/dL — ABNORMAL HIGH (ref 70–99)
Potassium: 4.8 mEq/L (ref 3.5–5.1)
Sodium: 131 mEq/L — ABNORMAL LOW (ref 135–145)
Total Bilirubin: 0.4 mg/dL (ref 0.3–1.2)

## 2013-04-10 LAB — URINALYSIS, ROUTINE W REFLEX MICROSCOPIC
Nitrite: NEGATIVE
Specific Gravity, Urine: 1.024 (ref 1.005–1.030)
Urobilinogen, UA: 0.2 mg/dL (ref 0.0–1.0)
pH: 5 (ref 5.0–8.0)

## 2013-04-10 LAB — GLUCOSE, CAPILLARY
Glucose-Capillary: 477 mg/dL — ABNORMAL HIGH (ref 70–99)
Glucose-Capillary: 333 mg/dL — ABNORMAL HIGH (ref 70–99)

## 2013-04-10 LAB — POCT I-STAT 3, ART BLOOD GAS (G3+)
Acid-base deficit: 25 mmol/L — ABNORMAL HIGH (ref 0.0–2.0)
O2 Saturation: 97 %
Patient temperature: 98.6
TCO2: <5 mmol/L (ref 0–100)
pH, Arterial: 7.033 — CL (ref 7.350–7.450)

## 2013-04-10 LAB — URINE MICROSCOPIC-ADD ON

## 2013-04-10 MED ORDER — SODIUM CHLORIDE 0.9 % IV SOLN
1000.0000 mL | Freq: Once | INTRAVENOUS | Status: AC
  Administered 2013-04-10: 1000 mL via INTRAVENOUS

## 2013-04-10 MED ORDER — DEXTROSE-NACL 5-0.45 % IV SOLN
INTRAVENOUS | Status: DC
  Administered 2013-04-11: 01:00:00 via INTRAVENOUS

## 2013-04-10 MED ORDER — POTASSIUM CHLORIDE 10 MEQ/100ML IV SOLN
10.0000 meq | INTRAVENOUS | Status: AC
  Administered 2013-04-11 (×2): 10 meq via INTRAVENOUS
  Filled 2013-04-10: qty 100

## 2013-04-10 MED ORDER — SODIUM CHLORIDE 0.9 % IV SOLN: INTRAVENOUS | Status: DC

## 2013-04-10 MED ORDER — SODIUM CHLORIDE 0.9 % IV BOLUS (SEPSIS)
1000.0000 mL | Freq: Once | INTRAVENOUS | Status: AC
  Administered 2013-04-10: 1000 mL via INTRAVENOUS

## 2013-04-10 MED ORDER — SODIUM CHLORIDE 0.9 % IV SOLN
1000.0000 mL | INTRAVENOUS | Status: DC
  Administered 2013-04-10: 1000 mL via INTRAVENOUS

## 2013-04-10 MED ORDER — SODIUM CHLORIDE 0.9 % IV SOLN
INTRAVENOUS | Status: DC
  Filled 2013-04-10: qty 1

## 2013-04-10 MED ORDER — ENOXAPARIN SODIUM 40 MG/0.4ML ~~LOC~~ SOLN
40.0000 mg | SUBCUTANEOUS | Status: DC
  Administered 2013-04-11 – 2013-04-14 (×4): 40 mg via SUBCUTANEOUS
  Filled 2013-04-10 (×4): qty 0.4

## 2013-04-10 MED ORDER — DEXTROSE 50 % IV SOLN: 25.0000 mL | INTRAVENOUS | Status: DC | PRN

## 2013-04-10 MED ORDER — SODIUM CHLORIDE 0.9 % IV SOLN
INTRAVENOUS | Status: DC
  Administered 2013-04-10: 4.2 [IU]/h via INTRAVENOUS
  Filled 2013-04-10 (×2): qty 1

## 2013-04-10 MED ORDER — SODIUM CHLORIDE 0.9 % IV SOLN
INTRAVENOUS | Status: AC
  Administered 2013-04-11: 01:00:00 via INTRAVENOUS

## 2013-04-10 NOTE — H&P (Signed)
[facility] History and Physical  [name] WUJ:[id number] DOB: [birth year] DOA: 04/10/2013  Referring physician: EDP PCP: Standley Dakins, MD  Specialists:   Chief Complaint:  ABD Pain with Nausea and Vomiting  HPI: [name] is a 22 y.o. female with a history of Type 1 Diabetes Mellitus who present to the ED with complaints of ABD pain and nausea and vomiting since the AM.  She reports that she had not been able to hold down any foods or liquids.  Her blood sugars were elevated and when she came to the ED, she was found to have a glucose level of 522.  She was placed on the DKA protocol and referred fior medical admission.      Review of Systems: The patient denies anorexia, fever, chills, headaches, weight loss, vision loss, diplopia, dizziness, decreased hearing, rhinitis, hoarseness, chest pain, syncope, dyspnea on exertion, peripheral edema, balance deficits, cough, hemoptysis, diarrhea, hematemesis, melena, hematochezia, severe indigestion/heartburn, dysuria, hematuria, incontinence, muscle weakness, suspicious skin lesions, transient blindness, difficulty walking, depression, unusual weight change, abnormal bleeding, enlarged lymph nodes, angioedema, and breast masses.    Past Medical History  Diagnosis Date  . Asthma   . Diabetes mellitus     type 1     History reviewed. No pertinent past surgical history.  Prior to Admission medications   Medication Sig Start Date End Date Taking? Authorizing Provider  insulin aspart (NOVOLOG) 100 UNIT/ML injection Inject 1-9 Units into the skin 3 (three) times daily with meals.    Yes Historical Provider, MD  insulin NPH (NOVOLIN N RELION) 100 UNIT/ML injection Inject 40 Units into the skin at bedtime. 11/12/12  Yes Clanford Cyndie Mull, MD    No Known Allergies   Social History:  reports that she has quit smoking. She does not have any smokeless tobacco history on file. She reports that she does not drink alcohol or use  illicit drugs.     Family History:    Hypertension in Mother and Maternal Aunt  DM2 in Maternal Great grandmother    Physical Exam:  GEN:  Pleasant  Well Nourished and well developed  22 y.o. African American female  examined  and in no acute distress; cooperative with exam Filed Vitals:   04/10/13 2130 04/10/13 2145 04/10/13 2200 04/10/13 2237  BP: 114/64 123/59 138/71   Pulse:      Temp:    98 F (36.7 C)  TempSrc:    Oral  Resp: 23 23 32   SpO2:    100%   Blood pressure 138/71, pulse 120, temperature 98 F (36.7 C), temperature source Oral, resp. rate 32, last menstrual period 04/10/2013, SpO2 100.00%. PSYCH: SHe is alert and oriented x4; does not appear anxious does not appear depressed; affect is normal HEENT: Normocephalic and Atraumatic, Mucous membranes pink; PERRLA; EOM intact; Fundi:  Benign;  No scleral icterus, Nares: Patent, Oropharynx: Clear, Fair Dentition, Neck:  FROM, no cervical lymphadenopathy nor thyromegaly or carotid bruit; no JVD; Breasts:: Not examined CHEST WALL: No tenderness CHEST: Normal respiration, clear to auscultation bilaterally HEART: Regular rate and rhythm; no murmurs rubs or gallops BACK: No kyphosis or scoliosis; no CVA tenderness ABDOMEN: Positive Bowel Sounds,soft non-tender; no masses, no organomegaly. Rectal Exam: Not done EXTREMITIES: No cyanosis, clubbing or edema; no ulcerations. Genitalia: not examined PULSES: 2+ and symmetric SKIN: Normal hydration no rash or ulceration CNS: Cranial nerves 2-12 grossly intact no focal neurologic deficit    Labs on Admission:  Basic Metabolic Panel:  Recent Labs  Lab 04/10/13 1422  NA 131*  K 4.8  CL 89*  CO2 9*  GLUCOSE 508*  BUN 15  CREATININE 0.56  CALCIUM 10.3   Liver Function Tests:  Recent Labs Lab 04/10/13 1422  AST 15  ALT 7  ALKPHOS 120*  BILITOT 0.4  PROT 8.2  ALBUMIN 4.2   No results found for this basename: LIPASE, AMYLASE,  in the last 168 hours No results  found for this basename: AMMONIA,  in the last 168 hours CBC:  Recent Labs Lab 04/10/13 1422  WBC 13.0*  NEUTROABS 10.9*  HGB 14.6  HCT 43.5  MCV 90.6  PLT 322   Cardiac Enzymes: No results found for this basename: CKTOTAL, CKMB, CKMBINDEX, TROPONINI,  in the last 168 hours  BNP (last 3 results) No results found for this basename: PROBNP,  in the last 8760 hours CBG:  Recent Labs Lab 04/10/13 1414 04/10/13 1957 04/10/13 2032 04/10/13 2200 04/10/13 2240  GLUCAP 485* 522* 477* 400* 333*    Radiological Exams on Admission: No results found.    Assessment/Plan Principal Problem:   DKA (diabetic ketoacidoses) Active Problems:   Type 1 diabetes mellitus   Abdominal pain, epigastric   Nausea with vomiting    1.  DKA-   DKA RPotocol wit IV Insulin drip per Glucosatbilizer and IVFs.  Monitor Electrolytes.    2.  ABD Pain- Monitro Lipase levels, has previous episode of Pancreatitis.    3.  Nausea and Vomiting-  Possibly due to #1, but may be due to Acute pancreatitis.  Check lipase levels.    4.  DVT prophylaxis with Lovenox.      Code Status:     FULL CODE Family Communication:    No Family Present Disposition Plan:      Inpatient   Time spent:  87 Minutes  Ron Parker [facility] Pager [phone]  If 7PM-7AM, please contact night-coverage [email or web address] Password [facility] [place] 04/10/2013, 11:26 PM

## 2013-04-10 NOTE — ED Notes (Addendum)
Pt states she ran out of insulin last night.  Pt states she takes Novolog and Apidra.  Pt c/o nausea.  Pt states she has vomited x 1, EMT states she caught pt sticking her fingers down her throat to make herself vomit.

## 2013-04-10 NOTE — ED Notes (Signed)
bld sugar 522

## 2013-04-10 NOTE — ED Provider Notes (Signed)
CSN: [id number]     Arrival date & time 04/10/13  1341 History   First MD Initiated Contact with Patient 04/10/13 1825     Chief Complaint  Patient presents with  . Hyperglycemia  . Medication Refill  . Nausea   (Consider location/radiation/quality/duration/timing/severity/associated sxs/prior Treatment) HPI This is a 22 year old female with a past medical history type 1 diabetes mellitus.  She has a past medical history admission for DKA.  Patient comes into the ED for evaluation of abdominal pain, nausea and vomiting.  Patient states that she ran out of her insulin and did not use any today.  She states that even though she works she has difficulty obtaining her medicine due to the cost.  [facility].  Patient states that she took her list dose of insulin today.  She states that while she was at work she began having abdominal pain, nausea and vomiting.  She's had numerous episodes of vomiting since that time.  On initial arrival patient has marked hyperglycemia and kussmaul breathing. Denies bilious or bloody vomit. Denies melena or hematochezia. Denies DOE, SOB, chest tightness or pressure, radiation to left arm, jaw or back, or diaphoresis. Denies dysuria, flank pain, suprapubic pain, frequency, urgency, or hematuria. Denies headaches, light headedness, weakness, visual disturbances.  Past Medical History  Diagnosis Date  . Asthma   . Diabetes mellitus     type 1    History reviewed. No pertinent past surgical history. No family history on file. History  Substance Use Topics  . Smoking status: Former Games developer  . Smokeless tobacco: Not on file  . Alcohol Use: No   OB History   Grav Para Term Preterm Abortions TAB SAB Ect Mult Living                 Review of Systems  Ten systems reviewed and are negative for acute change, except as noted in the HPI.   Allergies  Review of patient's allergies indicates no known  allergies.  Home Medications   Current Outpatient Rx  Name  Route  Sig  Dispense  Refill  . insulin aspart (NOVOLOG) 100 UNIT/ML injection   Subcutaneous   Inject 1-9 Units into the skin 3 (three) times daily with meals.          . insulin NPH (NOVOLIN N RELION) 100 UNIT/ML injection   Subcutaneous   Inject 40 Units into the skin at bedtime.   1 vial   6    BP 120/70  Pulse 117  Temp(Src) 98 F (36.7 C) (Oral)  Resp 19  SpO2 100%  LMP 04/10/2013 Physical Exam  Nursing note and vitals reviewed. Constitutional: She is oriented to person, place, and time.  Thin, ill appearing female Smell of ketones  HENT:  Head: Normocephalic and atraumatic.  Parched oral mucosa  Eyes: Conjunctivae are normal.  Neck: Normal range of motion. No JVD present.  Cardiovascular: Intact distal pulses.   tachycardic  Pulmonary/Chest: She has no wheezes.  Kussmaul breaths  Abdominal: Soft. There is tenderness (diffuse tenderness).  Musculoskeletal: She exhibits no edema.  Neurological: She is alert and oriented to person, place, and time.  Skin: Skin is warm.    ED Course  Procedures (including critical care time) Labs Review Labs Reviewed  CBC WITH DIFFERENTIAL - Abnormal; Notable for the following:    WBC 13.0 (*)    Neutrophils Relative % 84 (*)    Neutro Abs 10.9 (*)  All other components within normal limits  COMPREHENSIVE METABOLIC PANEL - Abnormal; Notable for the following:    Sodium 131 (*)    Chloride 89 (*)    CO2 9 (*)    Glucose, Bld 508 (*)    Alkaline Phosphatase 120 (*)    All other components within normal limits  GLUCOSE, CAPILLARY - Abnormal; Notable for the following:    Glucose-Capillary 485 (*)    All other components within normal limits  GLUCOSE, CAPILLARY - Abnormal; Notable for the following:    Glucose-Capillary 522 (*)    All other components within normal limits  URINALYSIS, ROUTINE W REFLEX MICROSCOPIC   Imaging Review No results  found.  EKG Interpretation   None     CRITICAL CARE Performed by: Arthor Captain   Total critical care time: 35  Critical care time was exclusive of separately billable procedures and treating other patients.  Critical care was necessary to treat or prevent imminent or life-threatening deterioration.  Critical care was time spent personally by me on the following activities: development of treatment plan with patient and/or surrogate as well as nursing, discussions with consultants, evaluation of patient's response to treatment, examination of patient, obtaining history from patient or surrogate, ordering and performing treatments and interventions, ordering and review of laboratory studies, ordering and review of radiographic studies, pulse oximetry and re-evaluation of patient's condition.   MDM   1. DKA (diabetic ketoacidoses)   2. High anion gap metabolic acidosis    Filed Vitals:   04/10/13 1900 04/10/13 1915 04/10/13 1933 04/10/13 2033  BP: 139/60 122/57 120/70 114/58  Pulse: 114 114 117 120  Temp:   98 F (36.7 C) 98.3 F (36.8 C)  TempSrc:    Oral  Resp: 22 15 19 18   SpO2: 100% 100% 100% 100%   Patient here with highly elevated glucose, ketones positive in urine.  Is a large amount of hemoglobin on the dip stick.  Question possible UTI.Marland Kitchen  Patient also has white count with left shift. Anion gap is 33. Patient Kussmaul breathing.  She's gotten fluid and begun on insulin drip.   9:07 PM Patient respiratory effort/rate is now normal. She is still tachycardic. She states her abdominal pain has resolved. CBG (last 3)   Recent Labs  04/10/13 1414 04/10/13 1957 04/10/13 2032  GLUCAP 485* 522* 477*    Patient ill need admission for DKA. I feel she is appropriate for stepdown.  9:30 PM Patient admitted to stepdown by Dr. Della Goo. The patient appears reasonably stabilized for admission considering the current resources, flow, and capabilities available  in the ED at this time, and I doubt any other [facility] requiring further screening and/or treatment in the ED prior to admission.   Arthor Captain, PA-C 04/10/13 2130

## 2013-04-11 LAB — MRSA PCR SCREENING: MRSA by PCR: NEGATIVE

## 2013-04-11 LAB — BASIC METABOLIC PANEL
BUN: 10 mg/dL (ref 6–23)
BUN: 9 mg/dL (ref 6–23)
CO2: 9 mEq/L — CL (ref 19–32)
CO2: 11 mEq/L — ABNORMAL LOW (ref 19–32)
CO2: 11 mEq/L — ABNORMAL LOW (ref 19–32)
Calcium: 8.6 mg/dL (ref 8.4–10.5)
Calcium: 8.3 mg/dL — ABNORMAL LOW (ref 8.4–10.5)
Calcium: 8.4 mg/dL (ref 8.4–10.5)
Chloride: 109 mEq/L (ref 96–112)
Chloride: 110 mEq/L (ref 96–112)
Chloride: 110 mEq/L (ref 96–112)
Chloride: 101 mEq/L (ref 96–112)
Creatinine, Ser: 0.48 mg/dL — ABNORMAL LOW (ref 0.50–1.10)
Creatinine, Ser: 0.48 mg/dL — ABNORMAL LOW (ref 0.50–1.10)
GFR calc Af Amer: >90 mL/min (ref >90)
GFR calc Af Amer: >90 mL/min (ref >90)
GFR calc non Af Amer: >90 mL/min (ref >90)
Glucose, Bld: 236 mg/dL — ABNORMAL HIGH (ref 70–99)
Glucose, Bld: 204 mg/dL — ABNORMAL HIGH (ref 70–99)
Glucose, Bld: 109 mg/dL — ABNORMAL HIGH (ref 70–99)
Glucose, Bld: 202 mg/dL — ABNORMAL HIGH (ref 70–99)
Potassium: 4.2 mEq/L (ref 3.5–5.1)
Potassium: 3.6 mEq/L (ref 3.5–5.1)
Sodium: 140 mEq/L (ref 135–145)
Sodium: 135 mEq/L (ref 135–145)
Sodium: 131 mEq/L — ABNORMAL LOW (ref 135–145)

## 2013-04-11 LAB — GLUCOSE, CAPILLARY
Glucose-Capillary: 116 mg/dL — ABNORMAL HIGH (ref 70–99)
Glucose-Capillary: 124 mg/dL — ABNORMAL HIGH (ref 70–99)
Glucose-Capillary: 130 mg/dL — ABNORMAL HIGH (ref 70–99)
Glucose-Capillary: 135 mg/dL — ABNORMAL HIGH (ref 70–99)
Glucose-Capillary: 147 mg/dL — ABNORMAL HIGH (ref 70–99)
Glucose-Capillary: 171 mg/dL — ABNORMAL HIGH (ref 70–99)
Glucose-Capillary: 190 mg/dL — ABNORMAL HIGH (ref 70–99)
Glucose-Capillary: 190 mg/dL — ABNORMAL HIGH (ref 70–99)
Glucose-Capillary: 193 mg/dL — ABNORMAL HIGH (ref 70–99)
Glucose-Capillary: 226 mg/dL — ABNORMAL HIGH (ref 70–99)
Glucose-Capillary: 233 mg/dL — ABNORMAL HIGH (ref 70–99)
Glucose-Capillary: 239 mg/dL — ABNORMAL HIGH (ref 70–99)
Glucose-Capillary: 277 mg/dL — ABNORMAL HIGH (ref 70–99)
Glucose-Capillary: 98 mg/dL (ref 70–99)
Glucose-Capillary: 99 mg/dL (ref 70–99)

## 2013-04-11 MED ORDER — LEVOFLOXACIN 750 MG PO TABS
750.0000 mg | ORAL_TABLET | Freq: Every day | ORAL | Status: DC
  Administered 2013-04-11 – 2013-04-14 (×4): 750 mg via ORAL
  Filled 2013-04-11 (×5): qty 1

## 2013-04-11 NOTE — Progress Notes (Signed)
[facility] Progress Note [place] TEAM 1 - Stepdown/ICU TEAM   Sherril Cong ZOX:[id number] DOB: 1990/11/21 DOA: 04/10/2013 PCP: Standley Dakins, MD  Admit HPI / Brief Narrative: 22 y.o. female with a history of Type 1 Diabetes Mellitus who presented to the ED with complaints of ABD pain, nausea, and vomiting. She reported that she had not been able to hold down any foods or liquids. Her blood sugars were elevated and when she came to the ED, she was found to have a glucose level of 522. She was placed on the DKA protocol and referred fior medical admission.   HPI/Subjective: The patient feels much better now.  She admits that she's not been taking her insulin because "it's too expensive for me to afford."  She denies current chest pain nausea vomiting abdominal pain fevers or chills.  Assessment/Plan:  DKA in uncontrolled DM1 CBG now well controlled on gtt, but bicarb remains low - cont insulin IV until bicarb improved  UTI UA weakly positive - will tx empirically in this clinical setting - patient reports multiple frequent urinary tract infections - will be important to followup culture data   Asthma  well compensated at the present time  Code Status: FULL Family Communication: no family present at time of exam Disposition Plan: SDU until off insulin gtt  Consultants: none  Procedures: none  Antibiotics: levaquin 12/27 >>  DVT prophylaxis: lovenox  Objective: Blood pressure 114/61, pulse 102, temperature 98.3 F (36.8 C), temperature source Oral, resp. rate 14, height 5\' 5"  (1.651 m), weight 57 kg (125 lb 10.6 oz), last menstrual period 04/10/2013, SpO2 99.00%.  Intake/Output Summary (Last 24 hours) at 04/11/13 1724 Last data filed at 04/11/13 1539  Gross per 24 hour  Intake 3287.31 ml  Output   2100 ml  Net 1187.31 ml   Exam: General: No acute respiratory distress Lungs: Clear to auscultation bilaterally without wheezes or crackles Cardiovascular:  Regular rate and rhythm without murmur gallop or rub normal S1 and S2 Abdomen: Nontender, nondistended, soft, bowel sounds positive, no rebound, no ascites, no appreciable mass Extremities: No significant cyanosis, clubbing, or edema bilateral lower extremities  Data Reviewed: Basic Metabolic Panel:  Recent Labs Lab 04/10/13 1422 04/11/13 0150 04/11/13 0430 04/11/13 1018  NA 131* 140 135 136  K 4.8 5.0 4.2 3.6  CL 89* 109 110 110  CO2 9* 9* 11* 15*  GLUCOSE 508* 236* 204* 109*  BUN 15 13 10 9   CREATININE 0.56 0.64 0.52 0.48*  CALCIUM 10.3 8.6 8.3* 8.4   Liver Function Tests:  Recent Labs Lab 04/10/13 1422  AST 15  ALT 7  ALKPHOS 120*  BILITOT 0.4  PROT 8.2  ALBUMIN 4.2    CBC:  Recent Labs Lab 04/10/13 1422  WBC 13.0*  NEUTROABS 10.9*  HGB 14.6  HCT 43.5  MCV 90.6  PLT 322   CBG:  Recent Labs Lab 04/11/13 1239 04/11/13 1344 04/11/13 1449 04/11/13 1555 04/11/13 1700  GLUCAP 99 98 124* 147* 232*    Recent Results (from the past 240 hour(s))  MRSA PCR SCREENING     Status: None   Collection Time    04/11/13  1:56 AM      Result Value Range Status   MRSA by PCR NEGATIVE  NEGATIVE Final   Comment:            The GeneXpert MRSA Assay (FDA     approved for NASAL specimens     only), is one component of a  comprehensive MRSA colonization     surveillance program. It is not     intended to diagnose MRSA     infection nor to guide or     monitor treatment for     MRSA infections.     Studies:  Recent x-ray studies have been reviewed in detail by the Attending Physician  Scheduled Meds:  Scheduled Meds: . enoxaparin (LOVENOX) injection  40 mg Subcutaneous Q24H  . levofloxacin  750 mg Oral Daily    Time spent on care of this patient: 35 mins   [facility]  [facility] Office  [phone] Pager - Text Page per Loretha Stapler as per below:  On-Call/Text Page:      [email or web address]      password [email or web address]  If 7PM-7AM, please contact  night-coverage [email or web address] Password [email or web address] 04/11/2013, 5:24 PM   LOS: 1 day

## 2013-04-11 NOTE — ED Provider Notes (Signed)
Medical screening examination/treatment/procedure(s) were performed by non-physician practitioner and as supervising physician I was immediately available for consultation/collaboration.  EKG Interpretation    Date/Time:    Ventricular Rate:    PR Interval:    QRS Duration:   QT Interval:    QTC Calculation:   R Axis:     Text Interpretation:               Flint Melter, MD 04/11/13 1419

## 2013-04-12 LAB — URINE CULTURE: Colony Count: >=100000

## 2013-04-12 LAB — GLUCOSE, CAPILLARY
Glucose-Capillary: 130 mg/dL — ABNORMAL HIGH (ref 70–99)
Glucose-Capillary: 144 mg/dL — ABNORMAL HIGH (ref 70–99)
Glucose-Capillary: 305 mg/dL — ABNORMAL HIGH (ref 70–99)
Glucose-Capillary: 185 mg/dL — ABNORMAL HIGH (ref 70–99)
Glucose-Capillary: 279 mg/dL — ABNORMAL HIGH (ref 70–99)
Glucose-Capillary: 253 mg/dL — ABNORMAL HIGH (ref 70–99)
Glucose-Capillary: 161 mg/dL — ABNORMAL HIGH (ref 70–99)
Glucose-Capillary: 106 mg/dL — ABNORMAL HIGH (ref 70–99)

## 2013-04-12 LAB — BASIC METABOLIC PANEL
BUN: 5 mg/dL — ABNORMAL LOW (ref 6–23)
BUN: 5 mg/dL — ABNORMAL LOW (ref 6–23)
BUN: 7 mg/dL (ref 6–23)
BUN: 7 mg/dL (ref 6–23)
CO2: 13 mEq/L — ABNORMAL LOW (ref 19–32)
CO2: 17 mEq/L — ABNORMAL LOW (ref 19–32)
CO2: 20 mEq/L (ref 19–32)
Calcium: 8.1 mg/dL — ABNORMAL LOW (ref 8.4–10.5)
Calcium: 8.4 mg/dL (ref 8.4–10.5)
Calcium: 8.4 mg/dL (ref 8.4–10.5)
Calcium: 8.5 mg/dL (ref 8.4–10.5)
Chloride: 104 mEq/L (ref 96–112)
Creatinine, Ser: 0.45 mg/dL — ABNORMAL LOW (ref 0.50–1.10)
Creatinine, Ser: 0.47 mg/dL — ABNORMAL LOW (ref 0.50–1.10)
Creatinine, Ser: 0.55 mg/dL (ref 0.50–1.10)
Creatinine, Ser: 0.58 mg/dL (ref 0.50–1.10)
Creatinine, Ser: 0.62 mg/dL (ref 0.50–1.10)
GFR calc Af Amer: >90 mL/min (ref >90)
GFR calc Af Amer: >90 mL/min (ref >90)
GFR calc Af Amer: >90 mL/min (ref >90)
GFR calc Af Amer: >90 mL/min (ref >90)
GFR calc non Af Amer: >90 mL/min (ref >90)
GFR calc non Af Amer: >90 mL/min (ref >90)
GFR calc non Af Amer: >90 mL/min (ref >90)
GFR calc non Af Amer: >90 mL/min (ref >90)
GFR calc non Af Amer: >90 mL/min (ref >90)
GFR calc non Af Amer: >90 mL/min (ref >90)
Glucose, Bld: 130 mg/dL — ABNORMAL HIGH (ref 70–99)
Glucose, Bld: 249 mg/dL — ABNORMAL HIGH (ref 70–99)
Glucose, Bld: 264 mg/dL — ABNORMAL HIGH (ref 70–99)
Glucose, Bld: 268 mg/dL — ABNORMAL HIGH (ref 70–99)
Potassium: 3.5 mEq/L (ref 3.5–5.1)
Potassium: 3.3 mEq/L — ABNORMAL LOW (ref 3.5–5.1)
Potassium: 3.4 mEq/L — ABNORMAL LOW (ref 3.5–5.1)
Potassium: 3.2 mEq/L — ABNORMAL LOW (ref 3.5–5.1)
Sodium: 136 mEq/L (ref 135–145)
Sodium: 135 mEq/L (ref 135–145)

## 2013-04-12 LAB — CBC
HCT: 34.7 % — ABNORMAL LOW (ref 36.0–46.0)
Hemoglobin: 12.2 g/dL (ref 12.0–15.0)
MCHC: 35.2 g/dL (ref 30.0–36.0)
MCV: 85.9 fL (ref 78.0–100.0)
Platelets: 238 10*3/uL (ref 150–400)
RBC: 4.04 MIL/uL (ref 3.87–5.11)
RDW: 14.5 % (ref 11.5–15.5)

## 2013-04-12 MED ORDER — INSULIN ASPART 100 UNIT/ML ~~LOC~~ SOLN
0.0000 [IU] | Freq: Three times a day (TID) | SUBCUTANEOUS | Status: DC
  Administered 2013-04-12: 8 [IU] via SUBCUTANEOUS

## 2013-04-12 MED ORDER — DEXTROSE-NACL 5-0.45 % IV SOLN: INTRAVENOUS | Status: DC

## 2013-04-12 MED ORDER — SODIUM CHLORIDE 0.9 % IV SOLN
INTRAVENOUS | Status: DC
  Administered 2013-04-12 – 2013-04-14 (×2): via INTRAVENOUS

## 2013-04-12 MED ORDER — SODIUM CHLORIDE 0.9 % IV SOLN: INTRAVENOUS | Status: DC

## 2013-04-12 MED ORDER — METOCLOPRAMIDE HCL 5 MG/ML IJ SOLN
5.0000 mg | Freq: Four times a day (QID) | INTRAMUSCULAR | Status: DC | PRN
  Filled 2013-04-12: qty 1

## 2013-04-12 MED ORDER — DEXTROSE-NACL 5-0.45 % IV SOLN
INTRAVENOUS | Status: AC
  Administered 2013-04-12: 18:00:00 via INTRAVENOUS

## 2013-04-12 MED ORDER — DEXTROSE 50 % IV SOLN: 25.0000 mL | INTRAVENOUS | Status: DC | PRN

## 2013-04-12 MED ORDER — SODIUM CHLORIDE 0.9 % IV SOLN
INTRAVENOUS | Status: DC
  Administered 2013-04-12: 11:00:00 via INTRAVENOUS

## 2013-04-12 MED ORDER — INSULIN ASPART 100 UNIT/ML ~~LOC~~ SOLN: 0.0000 [IU] | Freq: Every day | SUBCUTANEOUS | Status: DC

## 2013-04-12 MED ORDER — INSULIN ASPART 100 UNIT/ML ~~LOC~~ SOLN
0.0000 [IU] | Freq: Three times a day (TID) | SUBCUTANEOUS | Status: DC
  Administered 2013-04-12: 7 [IU] via SUBCUTANEOUS
  Administered 2013-04-13: 4 [IU] via SUBCUTANEOUS
  Administered 2013-04-13: 7 [IU] via SUBCUTANEOUS
  Administered 2013-04-13: 11 [IU] via SUBCUTANEOUS
  Administered 2013-04-14: 7 [IU] via SUBCUTANEOUS
  Administered 2013-04-14: 3 [IU] via SUBCUTANEOUS

## 2013-04-12 MED ORDER — INSULIN REGULAR HUMAN 100 UNIT/ML IJ SOLN
INTRAMUSCULAR | Status: AC
  Administered 2013-04-12: 16:00:00 via INTRAVENOUS
  Filled 2013-04-12: qty 1

## 2013-04-12 MED ORDER — INSULIN NPH (HUMAN) (ISOPHANE) 100 UNIT/ML ~~LOC~~ SUSP
20.0000 [IU] | Freq: Once | SUBCUTANEOUS | Status: AC
  Administered 2013-04-12: 20 [IU] via SUBCUTANEOUS
  Filled 2013-04-12: qty 10

## 2013-04-12 MED ORDER — INSULIN NPH (HUMAN) (ISOPHANE) 100 UNIT/ML ~~LOC~~ SUSP
20.0000 [IU] | Freq: Two times a day (BID) | SUBCUTANEOUS | Status: DC
  Administered 2013-04-13: 20 [IU] via SUBCUTANEOUS
  Filled 2013-04-12: qty 10

## 2013-04-12 MED ORDER — INSULIN GLARGINE 100 UNIT/ML ~~LOC~~ SOLN
20.0000 [IU] | Freq: Every day | SUBCUTANEOUS | Status: DC
  Administered 2013-04-12: 20 [IU] via SUBCUTANEOUS
  Filled 2013-04-12: qty 0.2

## 2013-04-12 MED ORDER — SODIUM CHLORIDE 0.9 % IV SOLN
INTRAVENOUS | Status: DC
  Administered 2013-04-12: 02:00:00 via INTRAVENOUS

## 2013-04-12 MED ORDER — INSULIN ASPART 100 UNIT/ML ~~LOC~~ SOLN
8.0000 [IU] | Freq: Three times a day (TID) | SUBCUTANEOUS | Status: DC
  Administered 2013-04-12 – 2013-04-13 (×2): 8 [IU] via SUBCUTANEOUS

## 2013-04-12 NOTE — Progress Notes (Signed)
[facility] Progress Note [place] TEAM 1 - Stepdown/ICU TEAM   Sherril Cong ZOX:[id number] DOB: 01/15/1991 DOA: 04/10/2013 PCP: Standley Dakins, MD  Admit HPI / Brief Narrative: 22 y.o. female with a history of Type 1 Diabetes Mellitus who presented to the ED with complaints of ABD pain, nausea, and vomiting. She reported that she had not been able to hold down any foods or liquids. Her blood sugars were elevated and when she came to the ED, she was found to have a glucose level of 522. She was placed on the DKA protocol and referred fior medical admission.   HPI/Subjective: The patient reports difficulty when she attempts to eat.  She states that she feels full very quickly and then develops nausea.  She denies headache chest pain shortness of breath fevers or chills.  Assessment/Plan:  DKA in uncontrolled DM1 Insulin drip was discontinued last night when bicarbonate was at 17 - subsequent labs revealed the patient had relapsed back into DKA with widening anion gap and drop in bicarbonate - she is now back on IV insulin with improvement in bicarbonate and narrowing of anion gap  Probable DKA associated gastroparesis When necessary Reglan and follow symptoms  UTI UA weakly positive - will tx empirically in this clinical setting with a full seven-day course as by definition this is NOT now an uncomplicated UTI - patient reports multiple frequent urinary tract infections - culture has not proven to be helpful in identifying the offending pathogen  Asthma well compensated at the present time  Code Status: FULL Family Communication: no family present at time of exam Disposition Plan: SDU until off insulin gtt  Consultants: none  Procedures: none  Antibiotics: levaquin 12/27 >>  DVT prophylaxis: lovenox  Objective: Blood pressure 138/73, pulse 109, temperature 98.5 F (36.9 C), temperature source Oral, resp. rate 16, height 5\' 5"  (1.651 m), weight 57 kg (125 lb  10.6 oz), last menstrual period 04/10/2013, SpO2 99.00%.  Intake/Output Summary (Last 24 hours) at 04/12/13 1328 Last data filed at 04/12/13 1113  Gross per 24 hour  Intake 2031.05 ml  Output   2975 ml  Net -943.95 ml   Exam: General: No acute respiratory distress Lungs: Clear to auscultation bilaterally without wheezes or crackles Cardiovascular: Regular rate and rhythm without murmur gallop or rub normal S1 and S2 Abdomen: Nontender, nondistended, soft, bowel sounds hypoactive but positive, no rebound, no ascites, no appreciable mass Extremities: No significant cyanosis, clubbing, or edema bilateral lower extremities  Data Reviewed: Basic Metabolic Panel:  Recent Labs Lab 04/11/13 1018 04/11/13 1625 04/12/13 0010 04/12/13 0545 04/12/13 1000  NA 136 131* 132* 133* 133*  K 3.6 3.6 3.4* 3.8 3.5  CL 110 101 103 100 103  CO2 15* 11* 17* 13* 17*  GLUCOSE 109* 202* 130* 264* 268*  BUN 9 7 5* 5* 7  CREATININE 0.48* 0.48* 0.45* 0.47* 0.58  CALCIUM 8.4 8.4 8.5 8.6 8.7   Liver Function Tests:  Recent Labs Lab 04/10/13 1422  AST 15  ALT 7  ALKPHOS 120*  BILITOT 0.4  PROT 8.2  ALBUMIN 4.2    CBC:  Recent Labs Lab 04/10/13 1422 04/12/13 1128  WBC 13.0* 9.6  NEUTROABS 10.9*  --   HGB 14.6 12.2  HCT 43.5 34.7*  MCV 90.6 85.9  PLT 322 238   CBG:  Recent Labs Lab 04/12/13 0229 04/12/13 0746 04/12/13 0934 04/12/13 1031 04/12/13 1135  GLUCAP 130* 274* 322* 305* 250*    Recent Results (from the past  240 hour(s))  URINE CULTURE     Status: None   Collection Time    04/10/13  7:57 PM      Result Value Range Status   Specimen Description URINE, RANDOM   Final   Special Requests NONE   Final   Culture  Setup Time     Final   Value: 04/11/2013 01:48     Performed at [facility] Count     Final   Value: >=100,000 COLONIES/ML     Performed at [facility]   Culture     Final   Value: Multiple bacterial morphotypes present, none  predominant. Suggest appropriate recollection if clinically indicated.     Performed at [facility]   Report Status 04/12/2013 FINAL   Final  MRSA PCR SCREENING     Status: None   Collection Time    04/11/13  1:56 AM      Result Value Range Status   MRSA by PCR NEGATIVE  NEGATIVE Final   Comment:            The GeneXpert MRSA Assay (FDA     approved for NASAL specimens     only), is one component of a     comprehensive MRSA colonization     surveillance program. It is not     intended to diagnose MRSA     infection nor to guide or     monitor treatment for     MRSA infections.     Studies:  Recent x-ray studies have been reviewed in detail by the Attending Physician  Scheduled Meds:  Scheduled Meds: . enoxaparin (LOVENOX) injection  40 mg Subcutaneous Q24H  . levofloxacin  750 mg Oral Daily    Time spent on care of this patient: 35 mins   [facility]  [facility] Office  [phone] Pager - Text Page per Loretha Stapler as per below:  On-Call/Text Page:      [email or web address]      password [email or web address]  If 7PM-7AM, please contact night-coverage [email or web address] Password [email or web address] 04/12/2013, 1:28 PM   LOS: 2 days

## 2013-04-12 NOTE — Progress Notes (Signed)
4 consecutive CBGs within range, anion gap closed. Elray Mcgregor notified. New orders received to transition patient off of insulin gtt. Will continue to monitor.   Rochele Pages, RN

## 2013-04-13 LAB — BASIC METABOLIC PANEL
BUN: 9 mg/dL (ref 6–23)
BUN: 5 mg/dL — ABNORMAL LOW (ref 6–23)
CO2: 23 mEq/L (ref 19–32)
Calcium: 8.2 mg/dL — ABNORMAL LOW (ref 8.4–10.5)
Calcium: 8.3 mg/dL — ABNORMAL LOW (ref 8.4–10.5)
Calcium: 8.5 mg/dL (ref 8.4–10.5)
Creatinine, Ser: 0.55 mg/dL (ref 0.50–1.10)
GFR calc Af Amer: >90 mL/min (ref >90)
GFR calc Af Amer: >90 mL/min (ref >90)
GFR calc non Af Amer: >90 mL/min (ref >90)
GFR calc non Af Amer: >90 mL/min (ref >90)
Glucose, Bld: 288 mg/dL — ABNORMAL HIGH (ref 70–99)
Glucose, Bld: 340 mg/dL — ABNORMAL HIGH (ref 70–99)
Potassium: 3.3 mEq/L — ABNORMAL LOW (ref 3.5–5.1)
Potassium: 3.3 mEq/L — ABNORMAL LOW (ref 3.5–5.1)
Sodium: 137 mEq/L (ref 135–145)
Sodium: 137 mEq/L (ref 135–145)

## 2013-04-13 LAB — GLUCOSE, CAPILLARY: Glucose-Capillary: 295 mg/dL — ABNORMAL HIGH (ref 70–99)

## 2013-04-13 MED ORDER — INSULIN ASPART 100 UNIT/ML ~~LOC~~ SOLN
14.0000 [IU] | Freq: Three times a day (TID) | SUBCUTANEOUS | Status: DC
  Administered 2013-04-13 – 2013-04-14 (×3): 14 [IU] via SUBCUTANEOUS

## 2013-04-13 MED ORDER — POTASSIUM CHLORIDE 20 MEQ/15ML (10%) PO LIQD
40.0000 meq | Freq: Two times a day (BID) | ORAL | Status: AC
  Administered 2013-04-13 – 2013-04-14 (×2): 40 meq via ORAL
  Filled 2013-04-13 (×4): qty 30

## 2013-04-13 MED ORDER — INSULIN ASPART 100 UNIT/ML ~~LOC~~ SOLN
12.0000 [IU] | Freq: Three times a day (TID) | SUBCUTANEOUS | Status: DC
  Administered 2013-04-13: 12 [IU] via SUBCUTANEOUS

## 2013-04-13 MED ORDER — INSULIN NPH (HUMAN) (ISOPHANE) 100 UNIT/ML ~~LOC~~ SUSP
30.0000 [IU] | Freq: Two times a day (BID) | SUBCUTANEOUS | Status: DC
  Administered 2013-04-13 – 2013-04-14 (×2): 30 [IU] via SUBCUTANEOUS
  Filled 2013-04-13: qty 10

## 2013-04-13 MED ORDER — POTASSIUM CHLORIDE CRYS ER 20 MEQ PO TBCR: 40.0000 meq | EXTENDED_RELEASE_TABLET | Freq: Two times a day (BID) | ORAL | Status: DC

## 2013-04-13 NOTE — Progress Notes (Signed)
Utilization review completed.  

## 2013-04-13 NOTE — Progress Notes (Signed)
Pt report called to 6E, all meds current to time. Family aware of new room number. All belongs packed to be transferred with patient. VSS.

## 2013-04-13 NOTE — Progress Notes (Addendum)
Transfer note:  Arrival Method: transfer from 3S05 via wheelchair Mental Orientation: A&Ox4 Telemetry: N/A Assessment: See docflowsheets Skin: Intact, tattoos IV: LFA NS@50  Pain: No pain Tubes: N/A 6E Orientation: Patient has been oriented to the unit, staff and to the room.  Family: At the bedside

## 2013-04-13 NOTE — Progress Notes (Signed)
[facility] Progress Note [place] TEAM 1 - Stepdown/ICU TEAM   Sherril Cong ZOX:[id number] DOB: 1990-11-15 DOA: 04/10/2013 PCP: Standley Dakins, MD  Admit HPI / Brief Narrative: 22 y.o. female with a history of Type 1 Diabetes Mellitus who presented to the ED with complaints of ABD pain, nausea, and vomiting. She reported that she had not been able to hold down any foods or liquids. Her blood sugars were elevated and when she came to the ED, she was found to have a glucose level of 522. She was placed on the DKA protocol and referred fior medical admission.   HPI/Subjective: The patient states that her nausea and GI symptoms have improved with improvement in her blood sugar control.  She denies fevers chills nausea vomiting or chest pain.  Assessment/Plan:  DKA in uncontrolled DM1 Insulin drip was discontinued 12/27 PM when bicarbonate was at 17 - subsequent labs revealed the patient had relapsed back into DKA with widening anion gap and drop in bicarbonate - she had to be placed back on the insulin gtt, but was again able to be transitioned off in the evening hours of 12/28 - her gap remains closed and her bicarb has normalized - nonetheless, her CBGs remain poorly controlled - titrate tx and follow as pt is at high risk for relapsing DKA   Probable DKA associated gastroparesis Continue when necessary Reglan - symptoms much improved suggesting that long-term treatment will consist only of proper blood sugar control  UTI UA weakly positive - will tx empirically in this clinical setting with a full seven-day course as by definition this is NOT an uncomplicated UTI - patient reports multiple frequent urinary tract infections - culture has not proven to be helpful in identifying the offending pathogen  Asthma well compensated at the present time  Hypokalemia Due to decreased intake plus intensive insulin therapy - replace and follow up in a.m.  Code Status: FULL Family  Communication: no family present at time of exam Disposition Plan: transfer to medical bed - DM education - follow CBG - watch intake/GI sx - d/c when CBG controlled   Consultants: none  Procedures: none  Antibiotics: levaquin 12/27 >>  DVT prophylaxis: lovenox  Objective: Blood pressure 127/84, pulse 88, temperature 98.1 F (36.7 C), temperature source Oral, resp. rate 13, height 5\' 5"  (1.651 m), weight 57 kg (125 lb 10.6 oz), last menstrual period 04/10/2013, SpO2 99.00%.  Intake/Output Summary (Last 24 hours) at 04/13/13 1315 Last data filed at 04/13/13 0800  Gross per 24 hour  Intake   2091 ml  Output   1200 ml  Net    891 ml   Exam: General: No acute respiratory distress Lungs: Clear to auscultation bilaterally without wheezes or crackles Cardiovascular: Regular rate and rhythm without murmur gallop or rub  Abdomen: Nontender, nondistended, soft, bowel sounds hypoactive but positive, no rebound, no ascites, no appreciable mass Extremities: No significant cyanosis, clubbing, or edema bilateral lower extremities  Data Reviewed: Basic Metabolic Panel:  Recent Labs Lab 04/12/13 1903 04/12/13 2155 04/13/13 0155 04/13/13 0525 04/13/13 1008  NA 136 135 137 137 137  K 3.4* 3.2* 3.3* 3.3* 3.0*  CL 104 105 105 106 103  CO2 23 22 22 20 23   GLUCOSE 249* 231* 326* 288* 340*  BUN 10 11 9 6  5*  CREATININE 0.62 0.51 0.52 0.41* 0.55  CALCIUM 8.4 8.1* 8.2* 8.3* 8.5   Liver Function Tests:  Recent Labs Lab 04/10/13 1422  AST 15  ALT 7  ALKPHOS 120*  BILITOT 0.4  PROT 8.2  ALBUMIN 4.2    CBC:  Recent Labs Lab 04/10/13 1422 04/12/13 1128  WBC 13.0* 9.6  NEUTROABS 10.9*  --   HGB 14.6 12.2  HCT 43.5 34.7*  MCV 90.6 85.9  PLT 322 238   CBG:  Recent Labs Lab 04/12/13 1738 04/12/13 1839 04/12/13 2223 04/13/13 0751 04/13/13 1135  GLUCAP 106* 215* 227* 220* 253*    Recent Results (from the past 240 hour(s))  URINE CULTURE     Status: None    Collection Time    04/10/13  7:57 PM      Result Value Range Status   Specimen Description URINE, RANDOM   Final   Special Requests NONE   Final   Culture  Setup Time     Final   Value: 04/11/2013 01:48     Performed at [facility] Count     Final   Value: >=100,000 COLONIES/ML     Performed at [facility]   Culture     Final   Value: Multiple bacterial morphotypes present, none predominant. Suggest appropriate recollection if clinically indicated.     Performed at [facility]   Report Status 04/12/2013 FINAL   Final  MRSA PCR SCREENING     Status: None   Collection Time    04/11/13  1:56 AM      Result Value Range Status   MRSA by PCR NEGATIVE  NEGATIVE Final   Comment:            The GeneXpert MRSA Assay (FDA     approved for NASAL specimens     only), is one component of a     comprehensive MRSA colonization     surveillance program. It is not     intended to diagnose MRSA     infection nor to guide or     monitor treatment for     MRSA infections.     Studies:  Recent x-ray studies have been reviewed in detail by the Attending Physician  Scheduled Meds:  Scheduled Meds: . enoxaparin (LOVENOX) injection  40 mg Subcutaneous Q24H  . insulin aspart  0-20 Units Subcutaneous TID WC  . insulin aspart  12 Units Subcutaneous TID WC  . insulin NPH  30 Units Subcutaneous BID AC & HS  . levofloxacin  750 mg Oral Daily    Time spent on care of this patient: 25 mins   [facility] T  [facility] Office  [phone] Pager - Text Page per Loretha Stapler as per below:  On-Call/Text Page:      [email or web address]      password [email or web address]  If 7PM-7AM, please contact night-coverage [email or web address] Password [email or web address] 04/13/2013, 1:15 PM   LOS: 3 days

## 2013-04-14 LAB — BASIC METABOLIC PANEL
BUN: 7 mg/dL (ref 6–23)
CO2: 26 mEq/L (ref 19–32)
Chloride: 104 mEq/L (ref 96–112)
Glucose, Bld: 158 mg/dL — ABNORMAL HIGH (ref 70–99)
Potassium: 3.5 mEq/L — ABNORMAL LOW (ref 3.7–5.3)

## 2013-04-14 LAB — GLUCOSE, CAPILLARY
Glucose-Capillary: 145 mg/dL — ABNORMAL HIGH (ref 70–99)
Glucose-Capillary: 230 mg/dL — ABNORMAL HIGH (ref 70–99)

## 2013-04-14 MED ORDER — METOCLOPRAMIDE HCL 5 MG PO TABS: 5.0000 mg | ORAL_TABLET | Freq: Three times a day (TID) | ORAL | Status: DC | PRN

## 2013-04-14 MED ORDER — INSULIN ASPART 100 UNIT/ML ~~LOC~~ SOLN: 14.0000 [IU] | Freq: Three times a day (TID) | SUBCUTANEOUS | Status: DC

## 2013-04-14 MED ORDER — METOCLOPRAMIDE HCL 5 MG PO TABS: 5.0000 mg | ORAL_TABLET | Freq: Three times a day (TID) | ORAL | Status: DC

## 2013-04-14 MED ORDER — POTASSIUM CHLORIDE CRYS ER 20 MEQ PO TBCR
40.0000 meq | EXTENDED_RELEASE_TABLET | Freq: Two times a day (BID) | ORAL | Status: DC
  Administered 2013-04-14: 40 meq via ORAL
  Filled 2013-04-14: qty 2

## 2013-04-14 MED ORDER — LEVOFLOXACIN 750 MG PO TABS: 250.0000 mg | ORAL_TABLET | Freq: Every day | ORAL | Status: DC

## 2013-04-14 MED ORDER — METOCLOPRAMIDE HCL 5 MG/ML IJ SOLN: 5.0000 mg | Freq: Four times a day (QID) | INTRAMUSCULAR | Status: DC | PRN

## 2013-04-14 MED ORDER — INSULIN NPH (HUMAN) (ISOPHANE) 100 UNIT/ML ~~LOC~~ SUSP: 30.0000 [IU] | Freq: Two times a day (BID) | SUBCUTANEOUS | Status: DC

## 2013-04-14 MED ORDER — LEVOFLOXACIN 750 MG PO TABS: 750.0000 mg | ORAL_TABLET | Freq: Every day | ORAL | Status: DC

## 2013-04-14 MED ORDER — LEVOFLOXACIN 250 MG PO TABS: 250.0000 mg | ORAL_TABLET | Freq: Every day | ORAL | Status: DC

## 2013-04-14 NOTE — Progress Notes (Signed)
Concern for insulin dosing as med rec is not accurate (?)   Pt scheduled for NPH 30 units bid (basal insulin is typically not that much for a pt of 57 kg, 14 units meal coverage tidwc and resistant correction scale tidwc Pt got 30 units NPH only at HS last night and fasting this am was good at 145 mg/L    Would think NPH 30 units once a day should be sufficient  Total daily Meal coverage is typically around 1/2 the basal dose, at 10 units tidwc if eats a complete meal  For type 1 pt, sensitive tidwc is sufficient to start.  I will talk with patient early afternoon, but my concern is for hypoglycemia with the present doses ordered. Thank you, Lenor Coffin, RN, CNS, Diabetes Coordinator [phone])

## 2013-04-14 NOTE — Discharge Summary (Signed)
Physician Discharge Summary  [name] ZOX:[id number] DOB: October 19, 1990 DOA: 04/10/2013  PCP: Jeanann Lewandowsky, MD  Admit date: 04/10/2013 Discharge date: 04/14/2013  Time spent: >45 minutes  Discharge Diagnoses:  Principal Problem:   DKA (diabetic ketoacidoses) Active Problems:   Type 1 diabetes mellitus   Abdominal pain, epigastric   Nausea with vomiting   Discharge Condition: stable  Diet recommendation: diabetic and heart healthy  Filed Weights   04/10/13 2330 04/13/13 2225  Weight: 57 kg (125 lb 10.6 oz) 64.592 kg (142 lb 6.4 oz)    History of present illness:  22 y.o. female with a history of Type 1 Diabetes Mellitus who presented to the ED with complaints of ABD pain, nausea, and vomiting. She reported that she had not been able to hold down any foods or liquids. Her blood sugars were elevated and when she came to the ED, she was found to have a glucose level of 522. She was placed on the DKA protocol and referred for medical admission.  Pt further admitted to not refilling her prescriptions this month. States she is out of money until her check comes on Friday.    Hospital Course:  DKA in uncontrolled DM1  Insulin drip was discontinued 12/27 PM when bicarbonate was at 17 - subsequent labs revealed the patient had relapsed back into DKA with widening anion gap and drop in bicarbonate - she had to be placed back on the insulin gtt, but was again able to be transitioned off in the evening hours of 12/28 - her gap remains closed and her bicarb has normalized - CBGs much better controlled on current doses of Insulin- Pt will receive assistance to pay for medications this time but has been advised to continue timely follow ups at the [facility].   Probable DKA associated gastroparesis  Continue when necessary Reglan - It has helped her symptoms   UTI  UA weakly positive - will tx empirically in this clinical setting with a full seven-day course of Levaquin as by  definition this is NOT an uncomplicated UTI - patient reports multiple frequent urinary tract infections - culture has not proven to be helpful in identifying the offending pathogen   Asthma  well compensated at the present time   Hypokalemia  Due to decreased intake plus intensive insulin therapy - replaced  Discharge Exam: Filed Vitals:   04/14/13 0943  BP: 134/84  Pulse: 88  Temp: 98.6 F (37 C)  Resp: 18    General: AAO x 3 ,no distress Cardiovascular: RRR, no murmurs Respiratory: CTA b/l   Discharge Instructions      Discharge Orders   Future Orders Complete By Expires   Diet - low sodium heart healthy  As directed    Comments:     Diabetic diet   Discharge instructions  As directed    Comments:     You will need to [facility] and have regularly scheduled visits.   Increase activity slowly  As directed        Medication List         insulin aspart 100 UNIT/ML injection  Commonly known as:  novoLOG  Inject 14 Units into the skin 3 (three) times daily with meals.     insulin NPH 100 UNIT/ML injection  Commonly known as:  HUMULIN N,NOVOLIN N  Inject 30 Units into the skin 2 (two) times daily at 8 am and 10 pm.     levofloxacin 250 MG tablet  Commonly known as:  LEVAQUIN  Take 1 tablet (250 mg total) by mouth daily.     metoCLOPramide 5 MG tablet  Commonly known as:  REGLAN  Take 1 tablet (5 mg total) by mouth 3 (three) times daily with meals as needed for nausea.       No Known Allergies    The results of significant diagnostics from this hospitalization (including imaging, microbiology, ancillary and laboratory) are listed below for reference.    Significant Diagnostic Studies: No results found.  Microbiology: Recent Results (from the past 240 hour(s))  URINE CULTURE     Status: None   Collection Time    04/10/13  7:57 PM      Result Value Range Status   Specimen Description URINE, RANDOM   Final   Special  Requests NONE   Final   Culture  Setup Time     Final   Value: 04/11/2013 01:48     Performed at [facility] Count     Final   Value: >=100,000 COLONIES/ML     Performed at [facility]   Culture     Final   Value: Multiple bacterial morphotypes present, none predominant. Suggest appropriate recollection if clinically indicated.     Performed at [facility]   Report Status 04/12/2013 FINAL   Final  MRSA PCR SCREENING     Status: None   Collection Time    04/11/13  1:56 AM      Result Value Range Status   MRSA by PCR NEGATIVE  NEGATIVE Final   Comment:            The GeneXpert MRSA Assay (FDA     approved for NASAL specimens     only), is one component of a     comprehensive MRSA colonization     surveillance program. It is not     intended to diagnose MRSA     infection nor to guide or     monitor treatment for     MRSA infections.     Labs: Basic Metabolic Panel:  Recent Labs Lab 04/12/13 2155 04/13/13 0155 04/13/13 0525 04/13/13 1008 04/14/13 0700  NA 135 137 137 137 140  K 3.2* 3.3* 3.3* 3.0* 3.5*  CL 105 105 106 103 104  CO2 22 22 20 23 26   GLUCOSE 231* 326* 288* 340* 158*  BUN 11 9 6  5* 7  CREATININE 0.51 0.52 0.41* 0.55 0.47*  CALCIUM 8.1* 8.2* 8.3* 8.5 8.6   Liver Function Tests:  Recent Labs Lab 04/10/13 1422  AST 15  ALT 7  ALKPHOS 120*  BILITOT 0.4  PROT 8.2  ALBUMIN 4.2   No results found for this basename: LIPASE, AMYLASE,  in the last 168 hours No results found for this basename: AMMONIA,  in the last 168 hours CBC:  Recent Labs Lab 04/10/13 1422 04/12/13 1128  WBC 13.0* 9.6  NEUTROABS 10.9*  --   HGB 14.6 12.2  HCT 43.5 34.7*  MCV 90.6 85.9  PLT 322 238   Cardiac Enzymes: No results found for this basename: CKTOTAL, CKMB, CKMBINDEX, TROPONINI,  in the last 168 hours BNP: BNP (last 3 results) No results found for this basename: PROBNP,  in the last 8760 hours CBG:  Recent Labs Lab  04/13/13 1542 04/13/13 1745 04/13/13 2207 04/14/13 0752 04/14/13 1126  GLUCAP 162* 71 295* 145* 230*       Signed:  Nihal Doan  [facility] 04/14/2013,  3:26 PM

## 2013-04-14 NOTE — Progress Notes (Signed)
   CARE MANAGEMENT NOTE 04/14/2013  Patient:  [name],[name]   Account Number:  [id number]  Date Initiated:  04/14/2013  Documentation initiated by:  Darlyne Russian  Subjective/Objective Assessment:   admitted with DKA blood sugar >500, nausea, vomiting  unable to afford insulin     Action/Plan:   progression of care and discharge planning Call community [facility] center for f/u appointment. MATCH letter for medication assistance.    Anticipated DC Date:  04/15/2013   Anticipated DC Plan:  HOME/SELF CARE      DC Planning Services  CM consult  MATCH Program      Choice offered to / List presented to:             Status of service:  In process, will continue to follow Medicare Important Message given?   (If response is "NO", the following Medicare IM given date fields will be blank) Date Medicare IM given:   Date Additional Medicare IM given:    Discharge Disposition:    Per UR Regulation:    If discussed at Long Length of Stay Meetings, dates discussed:    Comments:  04/14/2013  [address] RN, [place]  [phone] CM referral: medication assistance  Met with patient regarding medical follow up and medications refills.  She was seen by Dr Laural Benes at [facility], last visit in June. She fills Insulin script at [facility] for $24, she ran out of refills. She did not make an appointment with Dr Laural Benes to get refill script. She did start the process for the orange card, but has moved and needs to update the paperwork.  She does have a glucometer, lancets and strips. NCM will call [facility] to schedule a follow up appointment. MATCH letter for medication assistance place front of chart.  [facility]: left voice mail message requesting appointment.

## 2013-11-05 ENCOUNTER — Encounter (HOSPITAL_BASED_OUTPATIENT_CLINIC_OR_DEPARTMENT_OTHER): Payer: Self-pay | Admitting: Emergency Medicine

## 2013-11-05 ENCOUNTER — Emergency Department (HOSPITAL_BASED_OUTPATIENT_CLINIC_OR_DEPARTMENT_OTHER)
Admission: EM | Admit: 2013-11-05 | Discharge: 2013-11-05 | Disposition: A | Discharge status: Home / Self Care | Payer: 59 | Attending: Emergency Medicine | Admitting: Emergency Medicine

## 2013-11-05 DIAGNOSIS — Z794 Long term (current) use of insulin: Secondary | ICD-10-CM | POA: Insufficient documentation

## 2013-11-05 DIAGNOSIS — E109 Type 1 diabetes mellitus without complications: Secondary | ICD-10-CM | POA: Insufficient documentation

## 2013-11-05 DIAGNOSIS — R197 Diarrhea, unspecified: Secondary | ICD-10-CM | POA: Insufficient documentation

## 2013-11-05 DIAGNOSIS — Z87891 Personal history of nicotine dependence: Secondary | ICD-10-CM | POA: Insufficient documentation

## 2013-11-05 DIAGNOSIS — J45909 Unspecified asthma, uncomplicated: Secondary | ICD-10-CM | POA: Insufficient documentation

## 2013-11-05 DIAGNOSIS — Z3202 Encounter for pregnancy test, result negative: Secondary | ICD-10-CM | POA: Insufficient documentation

## 2013-11-05 DIAGNOSIS — Z792 Long term (current) use of antibiotics: Secondary | ICD-10-CM | POA: Insufficient documentation

## 2013-11-05 DIAGNOSIS — R112 Nausea with vomiting, unspecified: Secondary | ICD-10-CM | POA: Insufficient documentation

## 2013-11-05 LAB — URINE MICROSCOPIC-ADD ON

## 2013-11-05 LAB — COMPREHENSIVE METABOLIC PANEL
ALT: 7 U/L (ref 0–35)
AST: 19 U/L (ref 0–37)
Albumin: 3.4 g/dL — ABNORMAL LOW (ref 3.5–5.2)
Alkaline Phosphatase: 104 U/L (ref 39–117)
Anion gap: 16 — ABNORMAL HIGH (ref 5–15)
BILIRUBIN TOTAL: 0.5 mg/dL (ref 0.3–1.2)
BUN: 10 mg/dL (ref 6–23)
CO2: 21 meq/L (ref 19–32)
CREATININE: 0.4 mg/dL — AB (ref 0.50–1.10)
Calcium: 9.1 mg/dL (ref 8.4–10.5)
Chloride: 97 mEq/L (ref 96–112)
GFR calc Af Amer: >90 mL/min (ref >90)
Glucose, Bld: 366 mg/dL — ABNORMAL HIGH (ref 70–99)
Potassium: 4 mEq/L (ref 3.7–5.3)
Sodium: 134 mEq/L — ABNORMAL LOW (ref 137–147)
Total Protein: 7.3 g/dL (ref 6.0–8.3)

## 2013-11-05 LAB — CBC WITH DIFFERENTIAL/PLATELET
Basophils Absolute: 0 10*3/uL (ref 0.0–0.1)
Basophils Relative: 0 % (ref 0–1)
Eosinophils Absolute: 0 10*3/uL (ref 0.0–0.7)
Eosinophils Relative: 0 % (ref 0–5)
HEMATOCRIT: 38.7 % (ref 36.0–46.0)
HEMOGLOBIN: 13.5 g/dL (ref 12.0–15.0)
LYMPHS PCT: 5 % — AB (ref 12–46)
Lymphs Abs: 0.4 10*3/uL — ABNORMAL LOW (ref 0.7–4.0)
MCH: 30.2 pg (ref 26.0–34.0)
MCHC: 34.9 g/dL (ref 30.0–36.0)
MCV: 86.6 fL (ref 78.0–100.0)
MONO ABS: 0.3 10*3/uL (ref 0.1–1.0)
MONOS PCT: 4 % (ref 3–12)
Neutro Abs: 6.9 10*3/uL (ref 1.7–7.7)
Neutrophils Relative %: 90 % — ABNORMAL HIGH (ref 43–77)
Platelets: 318 10*3/uL (ref 150–400)
RBC: 4.47 MIL/uL (ref 3.87–5.11)
RDW: 13.2 % (ref 11.5–15.5)
WBC: 7.6 10*3/uL (ref 4.0–10.5)

## 2013-11-05 LAB — URINALYSIS, ROUTINE W REFLEX MICROSCOPIC
BILIRUBIN URINE: NEGATIVE
Glucose, UA: >1000 mg/dL — AB
HGB URINE DIPSTICK: NEGATIVE
Ketones, ur: 15 mg/dL — AB
Nitrite: NEGATIVE
PH: 5.5 (ref 5.0–8.0)
Protein, ur: NEGATIVE mg/dL
SPECIFIC GRAVITY, URINE: 1.043 — AB (ref 1.005–1.030)
Urobilinogen, UA: 0.2 mg/dL (ref 0.0–1.0)

## 2013-11-05 LAB — CBG MONITORING, ED
GLUCOSE-CAPILLARY: 305 mg/dL — AB (ref 70–99)
Glucose-Capillary: 273 mg/dL — ABNORMAL HIGH (ref 70–99)

## 2013-11-05 LAB — LIPASE, BLOOD: LIPASE: 19 U/L (ref 11–59)

## 2013-11-05 LAB — PREGNANCY, URINE: Preg Test, Ur: NEGATIVE

## 2013-11-05 LAB — KETONES, QUALITATIVE: Acetone, Bld: NEGATIVE

## 2013-11-05 MED ORDER — ONDANSETRON HCL 4 MG/2ML IJ SOLN
4.0000 mg | Freq: Once | INTRAMUSCULAR | Status: AC
  Administered 2013-11-05: 4 mg via INTRAVENOUS
  Filled 2013-11-05: qty 2

## 2013-11-05 MED ORDER — DICYCLOMINE HCL 10 MG PO CAPS
20.0000 mg | ORAL_CAPSULE | Freq: Once | ORAL | Status: AC
  Administered 2013-11-05: 20 mg via ORAL
  Filled 2013-11-05: qty 2

## 2013-11-05 MED ORDER — SODIUM CHLORIDE 0.9 % IV BOLUS (SEPSIS)
1000.0000 mL | Freq: Once | INTRAVENOUS | Status: AC
  Administered 2013-11-05: 1000 mL via INTRAVENOUS

## 2013-11-05 MED ORDER — DICYCLOMINE HCL 20 MG PO TABS: 20.0000 mg | ORAL_TABLET | Freq: Two times a day (BID) | ORAL | Status: DC

## 2013-11-05 MED ORDER — KETOROLAC TROMETHAMINE 30 MG/ML IJ SOLN
30.0000 mg | Freq: Once | INTRAMUSCULAR | Status: AC
  Administered 2013-11-05: 30 mg via INTRAVENOUS
  Filled 2013-11-05: qty 1

## 2013-11-05 MED ORDER — ONDANSETRON HCL 4 MG PO TABS: 4.0000 mg | ORAL_TABLET | Freq: Four times a day (QID) | ORAL | Status: DC

## 2013-11-05 MED ORDER — LOPERAMIDE HCL 2 MG PO CAPS
4.0000 mg | ORAL_CAPSULE | Freq: Once | ORAL | Status: AC
  Administered 2013-11-05: 4 mg via ORAL
  Filled 2013-11-05: qty 2

## 2013-11-05 NOTE — ED Notes (Addendum)
Pt brought in by [facility]. Pt reports abdominal pain, cramping, n/v/d. Pt unsure of LMP, possible pregnancy.

## 2013-11-05 NOTE — Discharge Instructions (Signed)

## 2013-11-05 NOTE — ED Provider Notes (Signed)
TIME SEEN: 8:18 AM  CHIEF COMPLAINT: Nausea, vomiting, diarrhea  HPI: Patient is a 23 y.o. F with history of insulin-dependent diabetes with multiple prior episodes of DKA, asthma who presents to the emergency department with nausea, vomiting, diarrhea and diffuse abdominal cramping and sharp pain that started yesterday at noon. She states her sister has had a similar GI virus but did not have vomiting, or diarrhea. She denies any fevers but states she has had "cold sweats". Denies any recent hospitalization, antibiotic use, travel. Denies any dysuria, hematuria, vaginal bleeding or discharge. Her blood glucose is normal in the 200s since being off of her insulin pump. No history of abdominal surgeries.  ROS: See HPI Constitutional: no fever  Eyes: no drainage  ENT: no runny nose   Cardiovascular:  no chest pain  Resp: no SOB  GI:  vomiting GU: no dysuria Integumentary: no rash  Allergy: no hives  Musculoskeletal: no leg swelling  Neurological: no slurred speech ROS otherwise negative  PAST MEDICAL HISTORY/PAST SURGICAL HISTORY:  Past Medical History  Diagnosis Date  . Asthma   . Diabetes mellitus     type 1     MEDICATIONS:  Prior to Admission medications   Medication Sig Start Date End Date Taking? Authorizing Provider  insulin aspart (NOVOLOG) 100 UNIT/ML injection Inject 14 Units into the skin 3 (three) times daily with meals. 04/14/13   Calvert CantorSaima Rizwan, MD  insulin NPH (HUMULIN N,NOVOLIN N) 100 UNIT/ML injection Inject 30 Units into the skin 2 (two) times daily at 8 am and 10 pm. 04/14/13   Calvert CantorSaima Rizwan, MD  levofloxacin (LEVAQUIN) 250 MG tablet Take 1 tablet (250 mg total) by mouth daily. 04/14/13   Calvert CantorSaima Rizwan, MD  metoCLOPramide (REGLAN) 5 MG tablet Take 1 tablet (5 mg total) by mouth 3 (three) times daily with meals as needed for nausea. 04/14/13   Calvert CantorSaima Rizwan, MD    ALLERGIES:  No Known Allergies  SOCIAL HISTORY:  History  Substance Use Topics  . Smoking status:  Former Games developermoker  . Smokeless tobacco: Not on file  . Alcohol Use: No    FAMILY HISTORY: No family history on file.  EXAM: BP 167/88  Pulse 112  Temp(Src) 98.2 F (36.8 C) (Oral)  Resp 24  SpO2 100%  LMP 09/14/2013 CONSTITUTIONAL: Alert and oriented and responds appropriately to questions. Well-appearing; well-nourished HEAD: Normocephalic EYES: Conjunctivae clear, PERRL ENT: normal nose; no rhinorrhea; dry mucous membranes; pharynx without lesions noted NECK: Supple, no meningismus, no LAD  CARD: RRR; S1 and S2 appreciated; no murmurs, no clicks, no rubs, no gallops RESP: Normal chest excursion without splinting or tachypnea; breath sounds clear and equal bilaterally; no wheezes, no rhonchi, no rales,  ABD/GI: Normal bowel sounds; non-distended; soft, non-tender, no rebound, no guarding, no peritoneal signs BACK:  The back appears normal and is non-tender to palpation, there is no CVA tenderness EXT: Normal ROM in all joints; non-tender to palpation; no edema; normal capillary refill; no cyanosis    SKIN: Normal color for age and race; warm NEURO: Moves all extremities equally PSYCH: The patient's mood and manner are appropriate. Grooming and personal hygiene are appropriate.  MEDICAL DECISION MAKING: Patient here with nausea, vomiting and diarrhea. Likely viral illness given her recent sick contacts but given she is a type I diabetic, will obtain labs to evaluate for possible DKA. Urine pending. We'll give IV fluids, Zofran and Imodium, Toradol and Bentyl. Given her benign abdominal exam, do not feel she needs abdominal imaging at  this time.  ED PROGRESS: Labs are unremarkable. Anion 16 but her glucose has improved with IV fluid and there is serum ketones. She's been able to tolerate multiple crackers and liquids without further vomiting in the ED. No diarrhea in the emergency department. Her abdominal exam is still benign. Have discussed supportive care instructions and return  precautions. We'll discharge with prescription for Zofran and Bentyl to use as needed. Patient verbalizes understanding and is comfortable with plan.     Layla Maw Kerolos Nehme, DO 11/05/13 1019

## 2013-11-05 NOTE — ED Notes (Signed)
MD at bedside. 

## 2014-10-28 ENCOUNTER — Encounter (HOSPITAL_COMMUNITY): Payer: Self-pay | Admitting: *Deleted

## 2014-10-28 ENCOUNTER — Emergency Department (HOSPITAL_COMMUNITY): Payer: 59

## 2014-10-28 ENCOUNTER — Emergency Department (HOSPITAL_COMMUNITY)
Admission: EM | Admit: 2014-10-28 | Discharge: 2014-10-29 | Disposition: A | Discharge status: Home / Self Care | Payer: 59 | Attending: Emergency Medicine | Admitting: Emergency Medicine

## 2014-10-28 DIAGNOSIS — E109 Type 1 diabetes mellitus without complications: Secondary | ICD-10-CM | POA: Diagnosis not present

## 2014-10-28 DIAGNOSIS — Z794 Long term (current) use of insulin: Secondary | ICD-10-CM | POA: Insufficient documentation

## 2014-10-28 DIAGNOSIS — J45909 Unspecified asthma, uncomplicated: Secondary | ICD-10-CM | POA: Insufficient documentation

## 2014-10-28 DIAGNOSIS — R1012 Left upper quadrant pain: Secondary | ICD-10-CM | POA: Diagnosis not present

## 2014-10-28 DIAGNOSIS — Z3202 Encounter for pregnancy test, result negative: Secondary | ICD-10-CM | POA: Diagnosis not present

## 2014-10-28 DIAGNOSIS — Z87891 Personal history of nicotine dependence: Secondary | ICD-10-CM | POA: Diagnosis not present

## 2014-10-28 LAB — URINE MICROSCOPIC-ADD ON

## 2014-10-28 LAB — CBC
HEMATOCRIT: 40.2 % (ref 36.0–46.0)
HEMOGLOBIN: 13.7 g/dL (ref 12.0–15.0)
MCH: 29.3 pg (ref 26.0–34.0)
MCHC: 34.1 g/dL (ref 30.0–36.0)
MCV: 86.1 fL (ref 78.0–100.0)
PLATELETS: 353 10*3/uL (ref 150–400)
RBC: 4.67 MIL/uL (ref 3.87–5.11)
RDW: 12.8 % (ref 11.5–15.5)
WBC: 10 10*3/uL (ref 4.0–10.5)

## 2014-10-28 LAB — CBG MONITORING, ED: Glucose-Capillary: 81 mg/dL (ref 65–99)

## 2014-10-28 LAB — COMPREHENSIVE METABOLIC PANEL
ALBUMIN: 4.3 g/dL (ref 3.5–5.0)
ALT: 8 U/L — AB (ref 14–54)
ANION GAP: 8 (ref 5–15)
AST: 16 U/L (ref 15–41)
Alkaline Phosphatase: 76 U/L (ref 38–126)
BILIRUBIN TOTAL: 0.6 mg/dL (ref 0.3–1.2)
BUN: 14 mg/dL (ref 6–20)
CHLORIDE: 105 mmol/L (ref 101–111)
CO2: 28 mmol/L (ref 22–32)
CREATININE: 0.53 mg/dL (ref 0.44–1.00)
Calcium: 9.9 mg/dL (ref 8.9–10.3)
GFR calc non Af Amer: >60 mL/min (ref >60)
Glucose, Bld: 64 mg/dL — ABNORMAL LOW (ref 65–99)
Potassium: 3.3 mmol/L — ABNORMAL LOW (ref 3.5–5.1)
Sodium: 141 mmol/L (ref 135–145)
TOTAL PROTEIN: 8.1 g/dL (ref 6.5–8.1)

## 2014-10-28 LAB — URINALYSIS, ROUTINE W REFLEX MICROSCOPIC
Bilirubin Urine: NEGATIVE
Glucose, UA: 250 mg/dL — AB
Ketones, ur: NEGATIVE mg/dL
Nitrite: NEGATIVE
PH: 6 (ref 5.0–8.0)
Protein, ur: NEGATIVE mg/dL
Specific Gravity, Urine: 1.026 (ref 1.005–1.030)
Urobilinogen, UA: 1 mg/dL (ref 0.0–1.0)

## 2014-10-28 LAB — LIPASE, BLOOD: Lipase: 10 U/L — ABNORMAL LOW (ref 22–51)

## 2014-10-28 LAB — I-STAT BETA HCG BLOOD, ED (MC, WL, AP ONLY): I-stat hCG, quantitative: <5 m[IU]/mL (ref <5)

## 2014-10-28 MED ORDER — FENTANYL CITRATE (PF) 100 MCG/2ML IJ SOLN
100.0000 ug | Freq: Once | INTRAMUSCULAR | Status: AC
  Administered 2014-10-29: 100 ug via INTRAVENOUS
  Filled 2014-10-28: qty 2

## 2014-10-28 NOTE — ED Notes (Signed)
Pt reports RUQ pain with nausea today.  Pt reports the last time she had this same pain, she was dx with kidney stone.

## 2014-10-29 ENCOUNTER — Encounter (HOSPITAL_COMMUNITY): Payer: Self-pay | Admitting: Radiology

## 2014-10-29 MED ORDER — DICYCLOMINE HCL 20 MG PO TABS: 20.0000 mg | ORAL_TABLET | Freq: Two times a day (BID) | ORAL | Status: DC

## 2014-10-29 MED ORDER — IOHEXOL 300 MG/ML  SOLN
100.0000 mL | Freq: Once | INTRAMUSCULAR | Status: AC | PRN
  Administered 2014-10-29: 100 mL via INTRAVENOUS

## 2014-10-29 NOTE — ED Provider Notes (Signed)
1:26 AM Patient signed out to me by Ebbie Ridgehris Lawyer, PA-C. Patient pending CT abdomen pelvis.   2:48 AM CT unremarkable for acute changes. Patient will be discharged with bentyl for symptoms. Vitals stable and patient afebrile.   Results for orders placed or performed during the hospital encounter of 10/28/14  Lipase, blood  Result Value Ref Range   Lipase 10 (L) 22 - 51 U/L  Comprehensive metabolic panel  Result Value Ref Range   Sodium 141 135 - 145 mmol/L   Potassium 3.3 (L) 3.5 - 5.1 mmol/L   Chloride 105 101 - 111 mmol/L   CO2 28 22 - 32 mmol/L   Glucose, Bld 64 (L) 65 - 99 mg/dL   BUN 14 6 - 20 mg/dL   Creatinine, Ser 2.950.53 0.44 - 1.00 mg/dL   Calcium 9.9 8.9 - 62.110.3 mg/dL   Total Protein 8.1 6.5 - 8.1 g/dL   Albumin 4.3 3.5 - 5.0 g/dL   AST 16 15 - 41 U/L   ALT 8 (L) 14 - 54 U/L   Alkaline Phosphatase 76 38 - 126 U/L   Total Bilirubin 0.6 0.3 - 1.2 mg/dL   GFR calc non Af Amer >60 >60 mL/min   GFR calc Af Amer >60 >60 mL/min   Anion gap 8 5 - 15  CBC  Result Value Ref Range   WBC 10.0 4.0 - 10.5 K/uL   RBC 4.67 3.87 - 5.11 MIL/uL   Hemoglobin 13.7 12.0 - 15.0 g/dL   HCT 30.840.2 65.736.0 - 84.646.0 %   MCV 86.1 78.0 - 100.0 fL   MCH 29.3 26.0 - 34.0 pg   MCHC 34.1 30.0 - 36.0 g/dL   RDW 96.212.8 95.211.5 - 84.115.5 %   Platelets 353 150 - 400 K/uL  Urinalysis, Routine w reflex microscopic (not at [facility])  Result Value Ref Range   Color, Urine YELLOW YELLOW   APPearance CLOUDY (A) CLEAR   Specific Gravity, Urine 1.026 1.005 - 1.030   pH 6.0 5.0 - 8.0   Glucose, UA 250 (A) NEGATIVE mg/dL   Hgb urine dipstick TRACE (A) NEGATIVE   Bilirubin Urine NEGATIVE NEGATIVE   Ketones, ur NEGATIVE NEGATIVE mg/dL   Protein, ur NEGATIVE NEGATIVE mg/dL   Urobilinogen, UA 1.0 0.0 - 1.0 mg/dL   Nitrite NEGATIVE NEGATIVE   Leukocytes, UA LARGE (A) NEGATIVE  Urine microscopic-add on  Result Value Ref Range   Squamous Epithelial / LPF MANY (A) RARE   WBC, UA 21-50 <3 WBC/hpf   RBC / HPF 0-2 <3 RBC/hpf   Bacteria, UA MANY (A) RARE   Urine-Other MUCOUS PRESENT   I-Stat beta hCG blood, ED ([facility], [facility], [facility] only)  Result Value Ref Range   I-stat hCG, quantitative <5.0 <5 mIU/mL   Comment 3          CBG monitoring, ED  Result Value Ref Range   Glucose-Capillary 81 65 - 99 mg/dL   Ct Abdomen Pelvis W Contrast  10/29/2014   CLINICAL DATA:  Left lower quadrant and left flank pain.  EXAM: CT ABDOMEN AND PELVIS WITH CONTRAST  TECHNIQUE: Multidetector CT imaging of the abdomen and pelvis was performed using the standard protocol following bolus administration of intravenous contrast.  CONTRAST:  100mL OMNIPAQUE IOHEXOL 300 MG/ML  SOLN  COMPARISON:  03/17/2013.  FINDINGS: Lung bases are clear.  The liver, spleen, gallbladder, pancreas, adrenal glands, kidneys, abdominal aorta, inferior vena cava, and retroperitoneal lymph nodes are unremarkable. Stomach, small bowel, and colon are not abnormally  distended. No free air or free fluid in the abdomen.  Pelvis: Appendix is not identified. Bladder wall is not thickened. Uterus and ovaries are not enlarged. Small amount of free fluid in the pelvis is likely physiologic. Sigmoid colon is decompressed but no specific inflammatory changes are suggested. No destructive bone lesions.  IMPRESSION: No acute process demonstrated in the abdomen or pelvis. No inflammatory changes are suggested. No evidence of bowel obstruction.   Electronically Signed   By: Burman Nieves M.D.   On: 10/29/2014 01:58      Emilia Beck, PA-C 10/29/14 0259  April Palumbo, MD 10/29/14 0559

## 2014-10-29 NOTE — Discharge Instructions (Signed)
Take bentyl as needed for pain. Refer to attached documents for more information.

## 2014-10-29 NOTE — ED Provider Notes (Signed)
CSN: [id number]     Arrival date & time 10/28/14  1954 History   First MD Initiated Contact with Patient 10/28/14 2246     Chief Complaint  Patient presents with  . Abdominal Pain     (Consider location/radiation/quality/duration/timing/severity/associated sxs/prior Treatment) HPI Patient presents to the emergency department with left upper quadrant abdominal pain and flank pain.  The patient states that she does not have any chest pain, shortness of breath, weakness, dizziness, headache, blurred vision, vomiting, diarrhea, rash, lightheadedness, anorexia, vaginal bleeding, vaginal discharge, fever, cough, or syncope.  The patient states that nothing seems to make her condition better, but palpation makes the pain worse.  Patient states that she did not take any medications prior to arrival Past Medical History  Diagnosis Date  . Asthma   . Diabetes mellitus     type 1    History reviewed. No pertinent past surgical history. No family history on file. History  Substance Use Topics  . Smoking status: Former Games developer  . Smokeless tobacco: Not on file  . Alcohol Use: No   OB History    No data available     Review of Systems  All other systems negative except as documented in the HPI. All pertinent positives and negatives as reviewed in the HPI.  Allergies  Aleve  Home Medications   Prior to Admission medications   Medication Sig Start Date End Date Taking? Authorizing Provider  insulin aspart (NOVOLOG) 100 UNIT/ML injection Inject 14 Units into the skin 3 (three) times daily with meals. Patient taking differently: Inject 2-10 Units into the skin 3 (three) times daily with meals.  04/14/13  Yes Calvert Cantor, MD  insulin glargine (LANTUS) 100 UNIT/ML injection Inject 20 Units into the skin 2 (two) times daily. 10/22/11  Yes Historical Provider, MD  dicyclomine (BENTYL) 20 MG tablet Take 1 tablet (20 mg total) by mouth 2 (two) times daily. As needed for abdominal pain Patient not  taking: Reported on 10/28/2014 11/05/13   Kristen N Ward, DO  insulin NPH (HUMULIN N,NOVOLIN N) 100 UNIT/ML injection Inject 30 Units into the skin 2 (two) times daily at 8 am and 10 pm. Patient not taking: Reported on 10/28/2014 04/14/13   Calvert Cantor, MD   BP 127/80 mmHg  Pulse 88  Temp(Src) 98.4 F (36.9 C) (Oral)  Resp 18  SpO2 100%  LMP 10/14/2014 Physical Exam  Constitutional: She is oriented to person, place, and time. She appears well-developed and well-nourished. No distress.  HENT:  Head: Normocephalic and atraumatic.  Mouth/Throat: Oropharynx is clear and moist.  Eyes: Pupils are equal, round, and reactive to light.  Neck: Normal range of motion. Neck supple.  Cardiovascular: Normal rate, regular rhythm and normal heart sounds.  Exam reveals no gallop and no friction rub.   No murmur heard. Pulmonary/Chest: Effort normal and breath sounds normal. No respiratory distress.  Abdominal: Soft. Bowel sounds are normal. She exhibits no distension. There is tenderness. There is no rebound and no guarding.  Musculoskeletal: She exhibits no edema.  Neurological: She is alert and oriented to person, place, and time. No cranial nerve deficit. She exhibits normal muscle tone. Coordination normal.  Skin: Skin is warm and dry. No rash noted. No erythema.  Psychiatric: She has a normal mood and affect. Her behavior is normal.  Nursing note and vitals reviewed.   ED Course  Procedures (including critical care time) Labs Review Labs Reviewed  LIPASE, BLOOD - Abnormal; Notable for the following:  Lipase 10 (*)    All other components within normal limits  COMPREHENSIVE METABOLIC PANEL - Abnormal; Notable for the following:    Potassium 3.3 (*)    Glucose, Bld 64 (*)    ALT 8 (*)    All other components within normal limits  URINALYSIS, ROUTINE W REFLEX MICROSCOPIC (NOT AT [facility]) - Abnormal; Notable for the following:    APPearance CLOUDY (*)    Glucose, UA 250 (*)    Hgb urine  dipstick TRACE (*)    Leukocytes, UA LARGE (*)    All other components within normal limits  URINE MICROSCOPIC-ADD ON - Abnormal; Notable for the following:    Squamous Epithelial / LPF MANY (*)    Bacteria, UA MANY (*)    All other components within normal limits  CBC  I-STAT BETA HCG BLOOD, ED ([facility], [facility], [facility] ONLY)  CBG MONITORING, ED    Imaging Review No results found.   Patient is awaiting CT scan.  Patient is advised plan and all questions were answered   Charlestine NightChristopher Eleuterio Dollar, PA-C 10/29/14 0045  April Palumbo, MD 10/29/14 [id number]

## 2014-10-30 LAB — URINE CULTURE

## 2015-07-23 ENCOUNTER — Emergency Department (HOSPITAL_BASED_OUTPATIENT_CLINIC_OR_DEPARTMENT_OTHER)
Admission: EM | Admit: 2015-07-23 | Discharge: 2015-07-23 | Disposition: A | Discharge status: Home / Self Care | Payer: 59 | Attending: Emergency Medicine | Admitting: Emergency Medicine

## 2015-07-23 ENCOUNTER — Encounter (HOSPITAL_BASED_OUTPATIENT_CLINIC_OR_DEPARTMENT_OTHER): Payer: Self-pay | Admitting: *Deleted

## 2015-07-23 DIAGNOSIS — J45909 Unspecified asthma, uncomplicated: Secondary | ICD-10-CM | POA: Diagnosis not present

## 2015-07-23 DIAGNOSIS — Z87891 Personal history of nicotine dependence: Secondary | ICD-10-CM | POA: Diagnosis not present

## 2015-07-23 DIAGNOSIS — Z79899 Other long term (current) drug therapy: Secondary | ICD-10-CM | POA: Diagnosis not present

## 2015-07-23 DIAGNOSIS — L03213 Periorbital cellulitis: Secondary | ICD-10-CM | POA: Insufficient documentation

## 2015-07-23 DIAGNOSIS — E109 Type 1 diabetes mellitus without complications: Secondary | ICD-10-CM | POA: Insufficient documentation

## 2015-07-23 DIAGNOSIS — Z794 Long term (current) use of insulin: Secondary | ICD-10-CM | POA: Diagnosis not present

## 2015-07-23 DIAGNOSIS — H5712 Ocular pain, left eye: Secondary | ICD-10-CM | POA: Diagnosis present

## 2015-07-23 MED ORDER — FLUORESCEIN SODIUM 1 MG OP STRP: 1.0000 | ORAL_STRIP | Freq: Once | OPHTHALMIC | Status: DC

## 2015-07-23 MED ORDER — CEPHALEXIN 500 MG PO CAPS: 500.0000 mg | ORAL_CAPSULE | Freq: Three times a day (TID) | ORAL | Status: DC

## 2015-07-23 MED ORDER — ERYTHROMYCIN 5 MG/GM OP OINT: 1.0000 "application " | TOPICAL_OINTMENT | Freq: Four times a day (QID) | OPHTHALMIC | Status: DC

## 2015-07-23 MED ORDER — TETRACAINE HCL 0.5 % OP SOLN: 1.0000 [drp] | Freq: Once | OPHTHALMIC | Status: DC

## 2015-07-23 NOTE — ED Provider Notes (Signed)
CSN: [id number]     Arrival date & time 07/23/15  1720 History   First MD Initiated Contact with Patient 07/23/15 1850     Chief Complaint  Patient presents with  . Eye Pain    [name] is a 25 y.o. female who is diabetic who presents to the ED Complaining of left upper eyelid pain, itching and swelling since yesterday. She reports she had some crusting around her left upper eyelid when waking up this morning. She reports some extra tearing and some intermittent blurry vision with this tearing of her eyes. She denies any double vision or any eye pain or eye redness. She does not wear contacts or glasses. No pain with movement of her eye. She has been using an over-the-counter eyedrop without relief. She denies fevers, double vision, nasal congestion, eye pain, eye redness, ear pain. She reports her blood sugars have been well controlled.   The history is provided by the patient. No language interpreter was used.    Past Medical History  Diagnosis Date  . Asthma   . Diabetes mellitus     type 1    History reviewed. No pertinent past surgical history. No family history on file. Social History  Substance Use Topics  . Smoking status: Former Games developer  . Smokeless tobacco: None  . Alcohol Use: No   OB History    No data available     Review of Systems  Constitutional: Negative for fever.  HENT: Negative for congestion, rhinorrhea and sore throat.   Eyes: Positive for itching. Negative for photophobia, pain, discharge, redness and visual disturbance.       Left eyelid pain and swelling.   Skin: Negative for rash.  Neurological: Negative for headaches.      Allergies  Aleve  Home Medications   Prior to Admission medications   Medication Sig Start Date End Date Taking? Authorizing Provider  cephALEXin (KEFLEX) 500 MG capsule Take 1 capsule (500 mg total) by mouth 3 (three) times daily. 07/23/15   Everlene Farrier, PA-C  dicyclomine (BENTYL) 20 MG tablet Take 1 tablet (20 mg  total) by mouth 2 (two) times daily. 10/29/14   Emilia Beck, PA-C  erythromycin ophthalmic ointment Place 1 application into the left eye every 6 (six) hours. Place 1/2 inch ribbon of ointment in the affected eye 4 times a day 07/23/15   Everlene Farrier, PA-C  insulin aspart (NOVOLOG) 100 UNIT/ML injection Inject 14 Units into the skin 3 (three) times daily with meals. Patient taking differently: Inject 2-10 Units into the skin 3 (three) times daily with meals.  04/14/13   Calvert Cantor, MD  insulin glargine (LANTUS) 100 UNIT/ML injection Inject 20 Units into the skin 2 (two) times daily. 10/22/11   Historical Provider, MD  insulin NPH (HUMULIN N,NOVOLIN N) 100 UNIT/ML injection Inject 30 Units into the skin 2 (two) times daily at 8 am and 10 pm. Patient not taking: Reported on 10/28/2014 04/14/13   Calvert Cantor, MD   BP 141/89 mmHg  Pulse 100  Temp(Src) 98.3 F (36.8 C) (Oral)  Resp 16  Ht  (1.651 m)  Wt 60.782 kg  BMI 22.30 kg/m2  SpO2 100% Physical Exam  Constitutional: She is oriented to person, place, and time. She appears well-developed and well-nourished. No distress.  Nontoxic appearing.  HENT:  Head: Normocephalic and atraumatic.  Right Ear: External ear normal.  Left Ear: External ear normal.  Mouth/Throat: Oropharynx is clear and moist.  Bilateral tympanic membranes are pearly-gray  without erythema or loss of landmarks.   Eyes: Conjunctivae and EOM are normal. Pupils are equal, round, and reactive to light. Right eye exhibits no discharge. Left eye exhibits no discharge. No scleral icterus.  Mild left upper eyelid erythema and edema. No lower eyelid involvement. No surrounding erythema. No conjunctival injection. EOMs intact without pain. No discharge.   Neck: Normal range of motion. Neck supple. No JVD present. No tracheal deviation present.  Cardiovascular: Normal rate, regular rhythm, normal heart sounds and intact distal pulses.   Pulmonary/Chest: Effort normal and  breath sounds normal. No respiratory distress. She has no wheezes. She has no rales.  Abdominal: Soft. There is no tenderness.  Lymphadenopathy:    She has no cervical adenopathy.  Neurological: She is alert and oriented to person, place, and time. No cranial nerve deficit. Coordination normal.  Skin: Skin is warm and dry. No rash noted. She is not diaphoretic. No pallor.  Psychiatric: She has a normal mood and affect. Her behavior is normal.  Nursing note and vitals reviewed.   ED Course  Procedures (including critical care time) Labs Review Labs Reviewed - No data to display    Visual Acuity  Right Eye Distance: 20/50 Left Eye Distance: 20/50 (Pt states that it was blurry any further down.) Bilateral Distance: 20/50  Right Eye Near:   Left Eye Near:    Bilateral Near:     Imaging Review No results found.    EKG Interpretation None      Filed Vitals:   07/23/15 1748  BP: 141/89  Pulse: 100  Temp: 98.3 F (36.8 C)  TempSrc: Oral  Resp: 16  Height: 5\' 5"  (1.651 m)  Weight: 60.782 kg  SpO2: 100%     MDM   Meds given in ED:  Medications - No data to display  New Prescriptions   CEPHALEXIN (KEFLEX) 500 MG CAPSULE    Take 1 capsule (500 mg total) by mouth 3 (three) times daily.   ERYTHROMYCIN OPHTHALMIC OINTMENT    Place 1 application into the left eye every 6 (six) hours. Place 1/2 inch ribbon of ointment in the affected eye 4 times a day    Final diagnoses:  Preseptal cellulitis of left eye   This is a 25 y.o. female who is diabetic who presents to the ED Complaining of left upper eyelid pain, itching and swelling since yesterday. She reports she had some crusting around her left upper eyelid when waking up this morning. She reports some extra tearing and some intermittent blurry vision with this tearing of her eyes. She denies any double vision or any eye pain or eye redness. She does not wear contacts or glasses. No pain with movement of her eye.  On exam  the patient is afebrile and nontoxic appearing. She has some mild erythema over her left upper eyelid. No lower eyelid involvement. No conjunctival injection. Vision is 20/50 bilaterally. EOMs are intact without pain. No evidence of hordeolum or stye. No discharge. No surrounding erythema. Will treat for preseptal cellulitis with Keflex and erythromycin ointment. We'll have her follow-up with ophthalmology. Patient reports she sees an ophthalmologist yearly for vision screening due to her diabetes. She has an ophthalmologist she would like to follow-up with. She reports she will follow-up with him this week. I advised the patient to follow-up with their primary care provider this week. I advised the patient to return to the emergency department with new or worsening symptoms or new concerns. The patient verbalized understanding and  agreement with plan.    This patient was discussed with Dr. Adela Lank who agrees with assessment and plan.     Everlene Farrier, PA-C 07/23/15 1937  Melene Plan, DO 07/23/15 2349

## 2015-07-23 NOTE — ED Notes (Signed)
Patient c/o left eye swollen & draining since yesterday. She states it hurts to blink or touch

## 2015-07-23 NOTE — ED Notes (Signed)
Pt c/o left eye swelling and pain since yesterday. Pt states whitish crust around left eye upon waking and some extra tearing in that eye.  Pt has used OTC eye crop for dry eye treatment with some  Relief, "Celluvisc eye drops."  Pt reports some blurring of vision in left eye.

## 2015-07-23 NOTE — Discharge Instructions (Signed)
Preseptal Cellulitis, Adult °Preseptal cellulitis--also called periorbital cellulitis--is an infection that can affect your eyelid and the soft tissues or skin that surround your eye. The infection may also affect the structures that produce and drain your tears. It does not affect your eye itself. °CAUSES °This condition may be caused by: °· Bacterial infection. °· Long-term (chronic) sinus infections. °· An object (foreign body) that is stuck behind the eye. °· An injury that: °¨ Goes through the eyelid tissues. °¨ Causes an infection, such as an insect sting. °· Fracture of the bone around the eye. °· Infections that have spread from the eyelid or other structures around the eye. °· Bite wounds. °· Inflammation or infection of the lining membranes of the brain (meningitis). °· An infection in the blood (septicemia). °· Dental infection (abscess). °· Viral infection. This is rare. °RISK FACTORS °Risk factors for preseptal cellulitis include: °· Participating in activities that increase your risk of trauma to the face or head, such as boxing or high-speed activities. °· Having a weakened defense system (immune system). °· Medical conditions, such as nasal polyps, that increase your risk for frequent or recurrent sinus infections. °· Not receiving regular dental care. °SYMPTOMS °Symptoms of this condition usually come on suddenly. Symptoms may include: °· Red, hot, and swollen eyelids. °· Fever. °· Difficulty opening your eye. °· Eye pain. °DIAGNOSIS °This condition may be diagnosed by an eye exam. You may also have tests, such as: °· Blood tests. °· CT scan. °· MRI. °· Spinal tap (lumbar puncture). This is a procedure that involves removing and examining a small amount of the fluid that surrounds the brain and spinal cord. This checks for meningitis. °TREATMENT °Treatment for this condition will include antibiotic medicines. These may be given by mouth (orally), through an IV, or as a shot. Your health care  provider may also recommend nasal decongestants to reduce swelling. °HOME CARE INSTRUCTIONS °· Take your antibiotic medicine as directed by your health care provider. Finish all of it even if you start to feel better. °· Take medicines only as directed by your health care provider. °· Drink enough fluid to keep your urine clear or pale yellow. °· Do not use any tobacco products, including cigarettes, chewing tobacco, or electronic cigarettes. If you need help quitting, ask your health care provider. °· Keep all follow-up visits as directed by your health care provider. These include any visits with an eye specialist (ophthalmologist) or dentist. °SEEK MEDICAL CARE IF: °· You have a fever. °· Your eyelids become more red, warm, or swollen. °· You have new symptoms. °· Your symptoms do not get better with treatment. °SEEK IMMEDIATE MEDICAL CARE IF: °· You develop double vision, or your vision becomes blurred or worsens in any way. °· You have trouble moving your eyes. °· Your eye looks like it is sticking out or bulging out (proptosis). °· You develop a severe headache, severe neck pain, or neck stiffness. °· You develop repeated vomiting. °  °This information is not intended to replace advice given to you by your health care provider. Make sure you discuss any questions you have with your health care provider. °  °Document Released: 05/05/2010 Document Revised: 08/17/2014 Document Reviewed: 03/29/2014 °Elsevier Interactive Patient Education ©2016 [employer] ° °

## 2016-01-20 ENCOUNTER — Encounter (HOSPITAL_COMMUNITY): Payer: Self-pay | Admitting: Emergency Medicine

## 2016-01-20 ENCOUNTER — Telehealth: Payer: Self-pay | Admitting: Family Medicine

## 2016-01-20 ENCOUNTER — Inpatient Hospital Stay (HOSPITAL_COMMUNITY)
Admission: EM | Admit: 2016-01-20 | Discharge: 2016-01-22 | DRG: 638 | Disposition: A | Discharge status: Home / Self Care | Payer: 59 | Attending: Family Medicine | Admitting: Family Medicine

## 2016-01-20 DIAGNOSIS — B353 Tinea pedis: Secondary | ICD-10-CM | POA: Diagnosis present

## 2016-01-20 DIAGNOSIS — R112 Nausea with vomiting, unspecified: Secondary | ICD-10-CM

## 2016-01-20 DIAGNOSIS — I10 Essential (primary) hypertension: Secondary | ICD-10-CM | POA: Diagnosis present

## 2016-01-20 DIAGNOSIS — E109 Type 1 diabetes mellitus without complications: Secondary | ICD-10-CM | POA: Diagnosis present

## 2016-01-20 DIAGNOSIS — Z9119 Patient's noncompliance with other medical treatment and regimen: Secondary | ICD-10-CM | POA: Diagnosis not present

## 2016-01-20 DIAGNOSIS — E1110 Type 2 diabetes mellitus with ketoacidosis without coma: Secondary | ICD-10-CM | POA: Diagnosis present

## 2016-01-20 DIAGNOSIS — Z794 Long term (current) use of insulin: Secondary | ICD-10-CM | POA: Diagnosis not present

## 2016-01-20 DIAGNOSIS — T383X6A Underdosing of insulin and oral hypoglycemic [antidiabetic] drugs, initial encounter: Secondary | ICD-10-CM | POA: Diagnosis present

## 2016-01-20 DIAGNOSIS — R1084 Generalized abdominal pain: Secondary | ICD-10-CM

## 2016-01-20 DIAGNOSIS — R12 Heartburn: Secondary | ICD-10-CM | POA: Diagnosis present

## 2016-01-20 DIAGNOSIS — J45909 Unspecified asthma, uncomplicated: Secondary | ICD-10-CM | POA: Diagnosis present

## 2016-01-20 DIAGNOSIS — E860 Dehydration: Secondary | ICD-10-CM | POA: Diagnosis present

## 2016-01-20 DIAGNOSIS — Z79899 Other long term (current) drug therapy: Secondary | ICD-10-CM

## 2016-01-20 DIAGNOSIS — E1010 Type 1 diabetes mellitus with ketoacidosis without coma: Secondary | ICD-10-CM | POA: Diagnosis present

## 2016-01-20 DIAGNOSIS — Z87891 Personal history of nicotine dependence: Secondary | ICD-10-CM

## 2016-01-20 DIAGNOSIS — Z886 Allergy status to analgesic agent status: Secondary | ICD-10-CM | POA: Diagnosis not present

## 2016-01-20 DIAGNOSIS — Z23 Encounter for immunization: Secondary | ICD-10-CM

## 2016-01-20 DIAGNOSIS — R1010 Upper abdominal pain, unspecified: Secondary | ICD-10-CM | POA: Diagnosis present

## 2016-01-20 DIAGNOSIS — D72829 Elevated white blood cell count, unspecified: Secondary | ICD-10-CM | POA: Diagnosis present

## 2016-01-20 DIAGNOSIS — N179 Acute kidney failure, unspecified: Secondary | ICD-10-CM | POA: Diagnosis present

## 2016-01-20 LAB — I-STAT BETA HCG BLOOD, ED (MC, WL, AP ONLY)

## 2016-01-20 LAB — CBC
HCT: 43 % (ref 36.0–46.0)
Hemoglobin: 14.3 g/dL (ref 12.0–15.0)
MCH: 30.4 pg (ref 26.0–34.0)
MCHC: 33.3 g/dL (ref 30.0–36.0)
MCV: 91.5 fL (ref 78.0–100.0)
PLATELETS: 422 10*3/uL — AB (ref 150–400)
RBC: 4.7 MIL/uL (ref 3.87–5.11)
RDW: 13.5 % (ref 11.5–15.5)
WBC: 15.3 10*3/uL — ABNORMAL HIGH (ref 4.0–10.5)

## 2016-01-20 LAB — URINE MICROSCOPIC-ADD ON

## 2016-01-20 LAB — URINALYSIS, ROUTINE W REFLEX MICROSCOPIC
GLUCOSE, UA: 500 mg/dL — AB
LEUKOCYTES UA: NEGATIVE
Nitrite: NEGATIVE
Protein, ur: 30 mg/dL — AB
Specific Gravity, Urine: >1.03 — ABNORMAL HIGH (ref 1.005–1.030)
pH: 5.5 (ref 5.0–8.0)

## 2016-01-20 LAB — CBG MONITORING, ED
GLUCOSE-CAPILLARY: 236 mg/dL — AB (ref 65–99)
Glucose-Capillary: 113 mg/dL — ABNORMAL HIGH (ref 65–99)
Glucose-Capillary: 147 mg/dL — ABNORMAL HIGH (ref 65–99)
Glucose-Capillary: 181 mg/dL — ABNORMAL HIGH (ref 65–99)
Glucose-Capillary: 246 mg/dL — ABNORMAL HIGH (ref 65–99)
Glucose-Capillary: 272 mg/dL — ABNORMAL HIGH (ref 65–99)

## 2016-01-20 LAB — BASIC METABOLIC PANEL
Anion gap: 25 — ABNORMAL HIGH (ref 5–15)
BUN: 18 mg/dL (ref 6–20)
CO2: 10 mmol/L — AB (ref 22–32)
CREATININE: 1.61 mg/dL — AB (ref 0.44–1.00)
Calcium: 10.7 mg/dL — ABNORMAL HIGH (ref 8.9–10.3)
Chloride: 106 mmol/L (ref 101–111)
GFR calc Af Amer: 51 mL/min — ABNORMAL LOW (ref >60)
GFR, EST NON AFRICAN AMERICAN: 44 mL/min — AB (ref >60)
Glucose, Bld: 284 mg/dL — ABNORMAL HIGH (ref 65–99)
POTASSIUM: 3.9 mmol/L (ref 3.5–5.1)
Sodium: 141 mmol/L (ref 135–145)

## 2016-01-20 LAB — I-STAT VENOUS BLOOD GAS, ED
ACID-BASE DEFICIT: 13 mmol/L — AB (ref 0.0–2.0)
BICARBONATE: 12.5 mmol/L — AB (ref 20.0–28.0)
O2 SAT: 65 %
PH VEN: 7.278 (ref 7.250–7.430)
PO2 VEN: 37 mmHg (ref 32.0–45.0)
TCO2: 13 mmol/L (ref 0–100)
pCO2, Ven: 26.8 mmHg — ABNORMAL LOW (ref 44.0–60.0)

## 2016-01-20 LAB — LIPASE, BLOOD: LIPASE: 36 U/L (ref 11–51)

## 2016-01-20 MED ORDER — SODIUM CHLORIDE 0.9 % IV SOLN
1000.0000 mL | Freq: Once | INTRAVENOUS | Status: AC
  Administered 2016-01-20: 1000 mL via INTRAVENOUS

## 2016-01-20 MED ORDER — SODIUM CHLORIDE 0.9 % IV SOLN
INTRAVENOUS | Status: DC
  Administered 2016-01-20: 2.9 [IU]/h via INTRAVENOUS
  Filled 2016-01-20 (×2): qty 2.5

## 2016-01-20 MED ORDER — ONDANSETRON HCL 4 MG/2ML IJ SOLN
4.0000 mg | Freq: Once | INTRAMUSCULAR | Status: AC
  Administered 2016-01-20: 4 mg via INTRAVENOUS
  Filled 2016-01-20: qty 2

## 2016-01-20 MED ORDER — DEXTROSE-NACL 5-0.45 % IV SOLN
INTRAVENOUS | Status: DC
  Administered 2016-01-21: 02:00:00 via INTRAVENOUS

## 2016-01-20 MED ORDER — ONDANSETRON 4 MG PO TBDP
4.0000 mg | ORAL_TABLET | Freq: Once | ORAL | Status: AC | PRN
  Administered 2016-01-20: 4 mg via ORAL

## 2016-01-20 MED ORDER — POTASSIUM CHLORIDE 10 MEQ/100ML IV SOLN
10.0000 meq | INTRAVENOUS | Status: AC
  Administered 2016-01-20 (×2): 10 meq via INTRAVENOUS
  Filled 2016-01-20 (×2): qty 100

## 2016-01-20 MED ORDER — ONDANSETRON 4 MG PO TBDP
ORAL_TABLET | ORAL | Status: AC
  Filled 2016-01-20: qty 1

## 2016-01-20 MED ORDER — SODIUM CHLORIDE 0.9 % IV SOLN
1000.0000 mL | INTRAVENOUS | Status: DC
  Administered 2016-01-20: 1000 mL via INTRAVENOUS

## 2016-01-20 NOTE — ED Notes (Signed)
Diabetic that missed her lantus  Last pm  Since 0600 am tioday she has had abd pain with n and v.

## 2016-01-20 NOTE — ED Notes (Signed)
Pt given med for nausea ice chips given per admitting doctors order

## 2016-01-20 NOTE — ED Notes (Signed)
Pt vomiting.

## 2016-01-20 NOTE — ED Notes (Signed)
Iv will onkly run 55900ml/hr  Also her potassium drip was slowed to 75 ml/hr  Because it was burning

## 2016-01-20 NOTE — ED Triage Notes (Signed)
Pt presents to ED for assessment of nausea, vomiting, abdominal pain, anorexia.  Sts she was out of her Humolog and Novolin for her Type I diabetes x 24 hrs, and she began to experience symptoms this morning approx 6am.  Pt sts she was able to get her medicines from the pharmacy, and CBG is below 300 at this time.  Pt still c/o symptoms, and also c/o SOB.  Pt sts Ketones in urine on home assessment.

## 2016-01-20 NOTE — ED Provider Notes (Signed)
[facility]-EMERGENCY DEPT Provider Note   CSN: [id number] Arrival date & time: 01/20/16  1239     History   Chief Complaint Chief Complaint  Patient presents with  . Hyperglycemia  . Nausea  . Abdominal Pain    HPI [name] is a 25 y.o. female.  Patient with a history of Type I DM presents today with a chief complaint of nausea, vomiting, and upper abdominal pain.  She reports onset of symptoms earlier today.  She states that the abdominal pain is located in the upper abdomen.  She states that her symptoms are similar to symptoms that she has had in the past with DKA.  She reports that she missed a dose of Lantus last evening.  She reports that earlier today her glucometer was reading as "high".  She last took a dose of Humolog 10 Units around 11:30 AM this morning.  She denies fever, chills, cough, SOB, diarrhea, confusion, or any other symptoms.        Past Medical History:  Diagnosis Date  . Asthma   . Diabetes mellitus    type 1     Patient Active Problem List   Diagnosis Date Noted  . Abdominal pain, epigastric 04/10/2013  . Nausea with vomiting 04/10/2013  . Type I (juvenile type) diabetes mellitus with unspecified complication, uncontrolled 11/26/2012  . Menorrhagia with irregular cycle 11/26/2012  . Encounter for Norplant removal 11/26/2012  . Hyperglycemia 11/13/2012  . Type 1 diabetes mellitus (HCC) 11/13/2012  . DKA (diabetic ketoacidoses) (HCC) 04/29/2011  . Noncompliance 04/29/2011    History reviewed. No pertinent surgical history.  OB History    No data available       Home Medications    Prior to Admission medications   Medication Sig Start Date End Date Taking? Authorizing Provider  cephALEXin (KEFLEX) 500 MG capsule Take 1 capsule (500 mg total) by mouth 3 (three) times daily. 07/23/15   Everlene Farrier, PA-C  dicyclomine (BENTYL) 20 MG tablet Take 1 tablet (20 mg total) by mouth 2 (two) times daily. 10/29/14   Emilia Beck, PA-C    erythromycin ophthalmic ointment Place 1 application into the left eye every 6 (six) hours. Place 1/2 inch ribbon of ointment in the affected eye 4 times a day 07/23/15   Everlene Farrier, PA-C  insulin aspart (NOVOLOG) 100 UNIT/ML injection Inject 14 Units into the skin 3 (three) times daily with meals. Patient taking differently: Inject 2-10 Units into the skin 3 (three) times daily with meals.  04/14/13   Calvert Cantor, MD  insulin glargine (LANTUS) 100 UNIT/ML injection Inject 20 Units into the skin 2 (two) times daily. 10/22/11   Historical Provider, MD  insulin NPH (HUMULIN N,NOVOLIN N) 100 UNIT/ML injection Inject 30 Units into the skin 2 (two) times daily at 8 am and 10 pm. Patient not taking: Reported on 10/28/2014 04/14/13   Calvert Cantor, MD    Family History History reviewed. No pertinent family history.  Social History Social History  Substance Use Topics  . Smoking status: Former Games developer  . Smokeless tobacco: Never Used  . Alcohol use No     Allergies   Aleve [naproxen sodium]   Review of Systems Review of Systems  All other systems reviewed and are negative.    Physical Exam Updated Vital Signs BP 123/67 (BP Location: Right Arm)   Pulse (!) 127   Temp 98.1 F (36.7 C) (Oral)   Resp 17   LMP 12/30/2015   SpO2 100%  Physical Exam  Constitutional: She is oriented to person, place, and time. She appears well-developed and well-nourished.  HENT:  Head: Normocephalic and atraumatic.  Mouth/Throat: Oropharynx is clear and moist.  Eyes: EOM are normal.  Neck: Normal range of motion. Neck supple.  Cardiovascular: Normal rate, regular rhythm and normal heart sounds.   Pulmonary/Chest: Effort normal and breath sounds normal. No respiratory distress.  Abdominal: Normal appearance and bowel sounds are normal. She exhibits no distension and no mass. There is tenderness in the right upper quadrant, epigastric area and left upper quadrant. There is no rebound and no guarding.  No hernia.  Musculoskeletal: Normal range of motion.  Neurological: She is alert and oriented to person, place, and time.  Skin: Skin is warm and dry.  Psychiatric: She has a normal mood and affect.  Nursing note and vitals reviewed.    ED Treatments / Results  Labs (all labs ordered are listed, but only abnormal results are displayed) Labs Reviewed  BASIC METABOLIC PANEL - Abnormal; Notable for the following:       Result Value   CO2 10 (*)    Glucose, Bld 284 (*)    Creatinine, Ser 1.61 (*)    Calcium 10.7 (*)    GFR calc non Af Amer 44 (*)    GFR calc Af Amer 51 (*)    Anion gap 25 (*)    All other components within normal limits  CBC - Abnormal; Notable for the following:    WBC 15.3 (*)    Platelets 422 (*)    All other components within normal limits  URINALYSIS, ROUTINE W REFLEX MICROSCOPIC (NOT AT [facility]) - Abnormal; Notable for the following:    Specific Gravity, Urine >1.030 (*)    Glucose, UA 500 (*)    Hgb urine dipstick SMALL (*)    Bilirubin Urine MODERATE (*)    Ketones, ur >80 (*)    Protein, ur 30 (*)    All other components within normal limits  URINE MICROSCOPIC-ADD ON - Abnormal; Notable for the following:    Squamous Epithelial / LPF 6-30 (*)    Bacteria, UA FEW (*)    Casts HYALINE CASTS (*)    All other components within normal limits  CBG MONITORING, ED - Abnormal; Notable for the following:    Glucose-Capillary 272 (*)    All other components within normal limits  CBG MONITORING, ED - Abnormal; Notable for the following:    Glucose-Capillary 113 (*)    All other components within normal limits  LIPASE, BLOOD  I-STAT BETA HCG BLOOD, ED ([facility], [facility], [facility] ONLY)    EKG  EKG Interpretation None       Radiology No results found.  Procedures Procedures (including critical care time)  Medications Ordered in ED Medications  ondansetron (ZOFRAN-ODT) 4 MG disintegrating tablet (not administered)  0.9 %  sodium chloride infusion (not  administered)    Followed by  0.9 %  sodium chloride infusion (not administered)    Followed by  0.9 %  sodium chloride infusion (not administered)  ondansetron (ZOFRAN) injection 4 mg (not administered)  ondansetron (ZOFRAN-ODT) disintegrating tablet 4 mg (4 mg Oral Given 01/20/16 1255)     Initial Impression / Assessment and Plan / ED Course  I have reviewed the triage vital signs and the nursing notes.  Pertinent labs & imaging results that were available during my care of the patient were reviewed by me and considered in my medical decision making (see chart  for details).  Clinical Course    Patient with a history of Type I DM and DKA presents today with nausea, vomiting, and abdominal pain onset today.  She does report that she missed a dose of Lantus last evening.  No fever.  Denies any recent illness or infection.  Labs showing a blood glucose of 272 initially and a anion gap of 25.  She also has ketones in the urine.  VBG consistent with DKA.  Patient given 2 L IVF and put on the glucostabilizer.  Patient also given two runs of IV Potassium to prevent hypokalemia.  Patient admitted to [facility] Medicine teaching service.    Final Clinical Impressions(s) / ED Diagnoses   Final diagnoses:  None    New Prescriptions New Prescriptions   No medications on file     Santiago Glad, PA-C 01/20/16 2044    Gwyneth Sprout, MD 01/20/16 2324

## 2016-01-20 NOTE — H&P (Signed)
Family Medicine Teaching [facility] Admission History and Physical Service Pager: [phone]  Patient name: [name] Medical record number: [id number] Date of birth: November 08, 1990 Age: 25 y.o. Gender: female  Primary Care Provider: Jeanann Lewandowsky, MD Consultants: None Code Status: Full (per discussion on admission)  Chief Complaint: DKA  Assessment and Plan: [name] is a 25 y.o. female presenting with DKA . PMH is significant for juvenile type 1 diabetes, asthma, and menorrhagia.  Moderate DKA: History of type 1 diabetes. Last admission for DKA per Epic was in December of 2014. Has had multiple admissions for DKA per patient. Likely developed DKA from missing her PM Lantus dose on 10/5. Home insulin regimen per patient: 20 units of Lantus qhs and 20 units qam, Humalog 2-10 units TID with meals.  ED labs: initial glucose 284, anion gap of 25, serum bicarbonate 10. UA was a dirty cats that showed few bacteria negative leukocytes and nitrites, 6-30 squamous. Status post 2 L normal saline in the ED. Alert and oriented x3 , just a little drowsy.  -Admit to stepdown unit, attending Dr. Pollie Meyer -Vital signs per floor protocol -Fluids per DKA protocol: Normal saline at maintenance rate for glucose greater than 250 and D5 half-normal saline at maintenance rates for glucose greater than 250. -Insulin drip per DKA protocol -Zofran when necessary for nausea and vomiting -CBGs every one hour -BMP every 4 hours -We will check mag and phosphorus -Strict I's and O's  -continuous cardiac monitoring next -obtain hemoglobin A1c - Check mag and phos  AKI: Creatinine of 1.61 on admission. Likely prerenal cause due to dehydration. Baseline creatinine appears to be between 0.4-0.5. -Fluids as above -A.m. BMP  ? Left Foot fungus: Possible tinea pedis on foot -Lotrimin cream twice a day   Hx of asthma: No home medications - Albuterol PRN  FEN/GI: Fluids per DKA protocol, carb  modified diet Prophylaxis: Lovenox  Disposition: Admit to SDU, attending Dr. Pollie Meyer  History of Present Illness:  [name] is a 24 y.o. female presenting with DKA.  Patient states that she was in her usual state of health yesterday night and early this morning. She did not take her Lantus last night. Did not give her reason for not taking this Lantus dose. She states that she never misses taking her insulin usually. She took her usual Lantus dose of 20 units this morning but felt like the "damage was already done" and then later on in the day felt very nauseous with abdominal pain.   Typically patient takes 20 units of Lantus before bed and 20 units in the morning. She has a 1-9 carb count ratio and takes 2 units for every glucose greater than 150.  Patient was diagnosed with type 1 diabetes when she was 25 years old. She has had about 5 admissions for DKA in the past. She notes that she was on insulin pump about 7 years ago and her diabetes seems to be very controlled then but since not being on the pump she has had a difficult time. She has been doing her own insulin since 2010. Her endocrinologist is Dr. Marlowe Kays, in [facility]. Per patient, her last hemoglobin A1c was noted to be 10.  Denies any fevers, chills, runny nose, sore throat, cough, chest pain, dysuria. Denies any sick contacts.  ED course: Received Zofran 1 and 2 L of normal saline.  Review Of Systems: Per HPI with the following additions: See history of present illness  Review of Systems  Constitutional:  Negative for chills, fever and weight loss.  HENT: Negative for congestion and sore throat.   Eyes: Negative for blurred vision.  Respiratory: Negative for cough, shortness of breath and wheezing.   Cardiovascular: Negative for chest pain, palpitations and leg swelling.  Gastrointestinal: Positive for abdominal pain, nausea and vomiting. Negative for diarrhea and heartburn.  Genitourinary: Negative for dysuria.    Musculoskeletal: Negative for myalgias.  Skin: Negative for rash.  Neurological: Negative for dizziness, focal weakness, weakness and headaches.  Endo/Heme/Allergies: Negative for polydipsia.  Psychiatric/Behavioral: Negative for depression.    Patient Active Problem List   Diagnosis Date Noted  . Abdominal pain, epigastric 04/10/2013  . Nausea with vomiting 04/10/2013  . Type I (juvenile type) diabetes mellitus with unspecified complication, uncontrolled 11/26/2012  . Menorrhagia with irregular cycle 11/26/2012  . Encounter for Norplant removal 11/26/2012  . Hyperglycemia 11/13/2012  . Type 1 diabetes mellitus (HCC) 11/13/2012  . DKA (diabetic ketoacidoses) (HCC) 04/29/2011  . Noncompliance 04/29/2011    Past Medical History: Past Medical History:  Diagnosis Date  . Asthma   . Diabetes mellitus    type 1     Past Surgical History: History reviewed. No pertinent surgical history.  Social History: Social History  Substance Use Topics  . Smoking status: Former Games developermoker  . Smokeless tobacco: Never Used  . Alcohol use No   Additional social history: none  Please also refer to relevant sections of EMR.  Family History: History reviewed. No pertinent family history.  Allergies and Medications: Allergies  Allergen Reactions  . Aleve [Naproxen Sodium] Swelling   No current facility-administered medications on file prior to encounter.    Current Outpatient Prescriptions on File Prior to Encounter  Medication Sig Dispense Refill  . insulin glargine (LANTUS) 100 UNIT/ML injection Inject 20 Units into the skin 2 (two) times daily.      Objective: BP 123/67 (BP Location: Right Arm)   Pulse (!) 127   Temp 98.1 F (36.7 C) (Oral)   Resp 17   LMP 12/30/2015   SpO2 100%  Exam: General: In no acute distress, laying in bed with eyes open and talkative. Slightly drowsy Eyes: PERRLA, nonicteric sclera ENTM: Dry mucous membranes, no nasal discharge, no pharyngeal  erythema Neck: No lymphadenopathy Cardiovascular: Tachycardic rate, regular rhythm, normal S1 and S2, no murmurs appreciated, no edema Respiratory: Clear to auscultation bilaterally, normal work of breathing Gastrointestinal: Tender to palpation in all 4 quadrants, hyperactive bowel sounds, no masses palpated MSK: Normal range of motion of all extremities Derm: Hyperpigmented dry lesion on the dorsum of the left foot (see picture below ( Neuro: Alert and oriented 3, 5 out of 5 strength in bilateral upper and lower extremities, sensation grossly intact throughout Psych: Normal mood and affect although slightly drowsy     Labs and Imaging: CBC BMET   Recent Labs Lab 01/20/16 1250  WBC 15.3*  HGB 14.3  HCT 43.0  PLT 422*    Recent Labs Lab 01/20/16 1250  NA 141  K 3.9  CL 106  CO2 10*  BUN 18  CREATININE 1.61*  GLUCOSE 284*  CALCIUM 10.7*     CBG (last 3)   Recent Labs  01/20/16 1701 01/20/16 1854 01/20/16 2055  GLUCAP 113* 147* 246*     Beaulah Dinninghristina M Roquel Burgin, MD 01/20/2016, 5:30 PM PGY-2, [place] [facility] FPTS Intern pager: [phone], text pages welcome

## 2016-01-20 NOTE — ED Notes (Signed)
Medication for insulin drip requested from pharmacy. Had been cancelled previously. Instructed to initiate insulin gtt by MD.

## 2016-01-20 NOTE — ED Notes (Signed)
Slowed down nss pot drip decreased to 60  Burning the pt

## 2016-01-21 DIAGNOSIS — R1084 Generalized abdominal pain: Secondary | ICD-10-CM | POA: Insufficient documentation

## 2016-01-21 LAB — BASIC METABOLIC PANEL
Anion gap: 14 (ref 5–15)
Anion gap: 16 — ABNORMAL HIGH (ref 5–15)
Anion gap: 9 (ref 5–15)
Anion gap: 9 (ref 5–15)
Anion gap: 12 (ref 5–15)
BUN: 9 mg/dL (ref 6–20)
BUN: 8 mg/dL (ref 6–20)
BUN: 6 mg/dL (ref 6–20)
CALCIUM: 9.3 mg/dL (ref 8.9–10.3)
CALCIUM: 9.4 mg/dL (ref 8.9–10.3)
CALCIUM: 9.5 mg/dL (ref 8.9–10.3)
CALCIUM: 9.5 mg/dL (ref 8.9–10.3)
CALCIUM: 9.5 mg/dL (ref 8.9–10.3)
CHLORIDE: 107 mmol/L (ref 101–111)
CO2: 17 mmol/L — AB (ref 22–32)
CO2: 11 mmol/L — AB (ref 22–32)
CO2: 13 mmol/L — AB (ref 22–32)
CO2: 16 mmol/L — AB (ref 22–32)
CO2: 18 mmol/L — AB (ref 22–32)
CREATININE: 0.61 mg/dL (ref 0.44–1.00)
CREATININE: 0.74 mg/dL (ref 0.44–1.00)
CREATININE: 0.83 mg/dL (ref 0.44–1.00)
CREATININE: 0.95 mg/dL (ref 0.44–1.00)
CREATININE: 1.08 mg/dL — AB (ref 0.44–1.00)
Chloride: 114 mmol/L — ABNORMAL HIGH (ref 101–111)
Chloride: 113 mmol/L — ABNORMAL HIGH (ref 101–111)
Chloride: 114 mmol/L — ABNORMAL HIGH (ref 101–111)
Chloride: 110 mmol/L (ref 101–111)
GFR calc Af Amer: >60 mL/min (ref >60)
GFR calc non Af Amer: >60 mL/min (ref >60)
GFR calc non Af Amer: >60 mL/min (ref >60)
GFR calc non Af Amer: >60 mL/min (ref >60)
GFR calc non Af Amer: >60 mL/min (ref >60)
GFR calc non Af Amer: >60 mL/min (ref >60)
GLUCOSE: 188 mg/dL — AB (ref 65–99)
GLUCOSE: 180 mg/dL — AB (ref 65–99)
GLUCOSE: 154 mg/dL — AB (ref 65–99)
GLUCOSE: 143 mg/dL — AB (ref 65–99)
Glucose, Bld: 176 mg/dL — ABNORMAL HIGH (ref 65–99)
Potassium: 4.1 mmol/L (ref 3.5–5.1)
Potassium: 4.1 mmol/L (ref 3.5–5.1)
Potassium: 3.6 mmol/L (ref 3.5–5.1)
Potassium: 3.3 mmol/L — ABNORMAL LOW (ref 3.5–5.1)
Potassium: 4.1 mmol/L (ref 3.5–5.1)
Sodium: 141 mmol/L (ref 135–145)
Sodium: 140 mmol/L (ref 135–145)
Sodium: 139 mmol/L (ref 135–145)
Sodium: 137 mmol/L (ref 135–145)
Sodium: 136 mmol/L (ref 135–145)

## 2016-01-21 LAB — GLUCOSE, CAPILLARY
GLUCOSE-CAPILLARY: 177 mg/dL — AB (ref 65–99)
GLUCOSE-CAPILLARY: 150 mg/dL — AB (ref 65–99)
GLUCOSE-CAPILLARY: 126 mg/dL — AB (ref 65–99)
Glucose-Capillary: 133 mg/dL — ABNORMAL HIGH (ref 65–99)
Glucose-Capillary: 149 mg/dL — ABNORMAL HIGH (ref 65–99)
Glucose-Capillary: 157 mg/dL — ABNORMAL HIGH (ref 65–99)
Glucose-Capillary: 139 mg/dL — ABNORMAL HIGH (ref 65–99)
Glucose-Capillary: 131 mg/dL — ABNORMAL HIGH (ref 65–99)

## 2016-01-21 LAB — CBG MONITORING, ED
GLUCOSE-CAPILLARY: 201 mg/dL — AB (ref 65–99)
GLUCOSE-CAPILLARY: 172 mg/dL — AB (ref 65–99)
GLUCOSE-CAPILLARY: 185 mg/dL — AB (ref 65–99)
Glucose-Capillary: 133 mg/dL — ABNORMAL HIGH (ref 65–99)
Glucose-Capillary: 117 mg/dL — ABNORMAL HIGH (ref 65–99)
Glucose-Capillary: 159 mg/dL — ABNORMAL HIGH (ref 65–99)
Glucose-Capillary: 198 mg/dL — ABNORMAL HIGH (ref 65–99)

## 2016-01-21 LAB — CBC
HCT: 41.3 % (ref 36.0–46.0)
Hemoglobin: 13.4 g/dL (ref 12.0–15.0)
MCH: 29.6 pg (ref 26.0–34.0)
MCHC: 32.4 g/dL (ref 30.0–36.0)
MCV: 91.4 fL (ref 78.0–100.0)
PLATELETS: 412 10*3/uL — AB (ref 150–400)
RBC: 4.52 MIL/uL (ref 3.87–5.11)
RDW: 13.5 % (ref 11.5–15.5)
WBC: 17.4 10*3/uL — ABNORMAL HIGH (ref 4.0–10.5)

## 2016-01-21 LAB — MAGNESIUM: Magnesium: 2.2 mg/dL (ref 1.7–2.4)

## 2016-01-21 LAB — PREGNANCY, URINE: Preg Test, Ur: POSITIVE — AB

## 2016-01-21 LAB — MRSA PCR SCREENING: MRSA by PCR: NEGATIVE

## 2016-01-21 LAB — PHOSPHORUS: PHOSPHORUS: 3.1 mg/dL (ref 2.5–4.6)

## 2016-01-21 MED ORDER — INSULIN ASPART 100 UNIT/ML ~~LOC~~ SOLN
0.0000 [IU] | Freq: Three times a day (TID) | SUBCUTANEOUS | Status: DC
  Administered 2016-01-22 (×2): 3 [IU] via SUBCUTANEOUS
  Administered 2016-01-22: 2 [IU] via SUBCUTANEOUS

## 2016-01-21 MED ORDER — SODIUM CHLORIDE 0.9 % IV SOLN
INTRAVENOUS | Status: DC
  Administered 2016-01-21 (×2): 2.7 [IU]/h via INTRAVENOUS
  Administered 2016-01-21: 1.3 [IU]/h via INTRAVENOUS
  Administered 2016-01-21: 2.9 [IU]/h via INTRAVENOUS
  Filled 2016-01-21: qty 2.5

## 2016-01-21 MED ORDER — LISINOPRIL 5 MG PO TABS
5.0000 mg | ORAL_TABLET | Freq: Every day | ORAL | Status: DC
  Administered 2016-01-21 – 2016-01-22 (×2): 5 mg via ORAL
  Filled 2016-01-21 (×2): qty 1

## 2016-01-21 MED ORDER — POTASSIUM CHLORIDE 10 MEQ/100ML IV SOLN
10.0000 meq | INTRAVENOUS | Status: AC
  Administered 2016-01-21 (×3): 10 meq via INTRAVENOUS
  Filled 2016-01-21 (×3): qty 100

## 2016-01-21 MED ORDER — CLOTRIMAZOLE 1 % EX CREA
TOPICAL_CREAM | Freq: Two times a day (BID) | CUTANEOUS | Status: DC
  Administered 2016-01-21 – 2016-01-22 (×2): via TOPICAL
  Filled 2016-01-21: qty 15

## 2016-01-21 MED ORDER — INSULIN DETEMIR 100 UNIT/ML ~~LOC~~ SOLN
15.0000 [IU] | Freq: Two times a day (BID) | SUBCUTANEOUS | Status: DC
  Administered 2016-01-21 – 2016-01-22 (×3): 15 [IU] via SUBCUTANEOUS
  Filled 2016-01-21 (×4): qty 0.15

## 2016-01-21 MED ORDER — INFLUENZA VAC SPLIT QUAD 0.5 ML IM SUSY
0.5000 mL | PREFILLED_SYRINGE | INTRAMUSCULAR | Status: AC
  Administered 2016-01-22: 0.5 mL via INTRAMUSCULAR
  Filled 2016-01-21: qty 0.5

## 2016-01-21 MED ORDER — HYDRALAZINE HCL 20 MG/ML IJ SOLN: 5.0000 mg | INTRAMUSCULAR | Status: DC | PRN

## 2016-01-21 MED ORDER — PANTOPRAZOLE SODIUM 40 MG PO TBEC
40.0000 mg | DELAYED_RELEASE_TABLET | Freq: Every day | ORAL | Status: DC
  Administered 2016-01-21 – 2016-01-22 (×2): 40 mg via ORAL
  Filled 2016-01-21 (×3): qty 1

## 2016-01-21 MED ORDER — POTASSIUM CHLORIDE 10 MEQ/100ML IV SOLN
10.0000 meq | Freq: Once | INTRAVENOUS | Status: AC
  Administered 2016-01-21: 10 meq via INTRAVENOUS
  Filled 2016-01-21: qty 100

## 2016-01-21 MED ORDER — ACETAMINOPHEN 325 MG PO TABS
650.0000 mg | ORAL_TABLET | Freq: Four times a day (QID) | ORAL | Status: DC | PRN
  Administered 2016-01-21 – 2016-01-22 (×3): 650 mg via ORAL
  Filled 2016-01-21 (×3): qty 2

## 2016-01-21 MED ORDER — INSULIN ASPART 100 UNIT/ML ~~LOC~~ SOLN: 0.0000 [IU] | Freq: Every day | SUBCUTANEOUS | Status: DC

## 2016-01-21 MED ORDER — ONDANSETRON 4 MG PO TBDP
4.0000 mg | ORAL_TABLET | Freq: Three times a day (TID) | ORAL | Status: AC | PRN
  Administered 2016-01-21: 4 mg via ORAL
  Filled 2016-01-21: qty 1

## 2016-01-21 MED ORDER — SODIUM CHLORIDE 0.9 % IV SOLN
INTRAVENOUS | Status: DC
  Administered 2016-01-21 – 2016-01-22 (×3): via INTRAVENOUS

## 2016-01-21 MED ORDER — ALBUTEROL SULFATE (2.5 MG/3ML) 0.083% IN NEBU: 3.0000 mL | INHALATION_SOLUTION | RESPIRATORY_TRACT | Status: DC | PRN

## 2016-01-21 MED ORDER — ENOXAPARIN SODIUM 40 MG/0.4ML ~~LOC~~ SOLN
40.0000 mg | SUBCUTANEOUS | Status: DC
  Administered 2016-01-21 – 2016-01-22 (×2): 40 mg via SUBCUTANEOUS
  Filled 2016-01-21 (×2): qty 0.4

## 2016-01-21 MED ORDER — INSULIN ASPART 100 UNIT/ML ~~LOC~~ SOLN
0.0000 [IU] | Freq: Three times a day (TID) | SUBCUTANEOUS | Status: DC
  Administered 2016-01-21: 1 [IU] via SUBCUTANEOUS

## 2016-01-21 MED ORDER — DEXTROSE-NACL 5-0.45 % IV SOLN
INTRAVENOUS | Status: DC
  Administered 2016-01-21: 06:00:00 via INTRAVENOUS

## 2016-01-21 MED ORDER — POTASSIUM CHLORIDE 10 MEQ/100ML IV SOLN: 10.0000 meq | INTRAVENOUS | Status: AC

## 2016-01-21 NOTE — ED Notes (Signed)
Ordered breakfast tray  

## 2016-01-21 NOTE — Progress Notes (Signed)
Family Medicine Teaching Service Daily Progress Note Intern Pager: [phone]  Patient name: [name] Medical record number: [id number] Date of birth: [birth year] Age: 25 y.o. Gender: female  Primary Care Provider: Jeanann Lewandowsky, MD Consultants: None Code Status: Full  Pt Overview and Major Events to Date:  10/6: Admit for DKA 10/7: Continue IVF and insulin gtt until AG closes x 2  Assessment and Plan: [name] is a 25 y.o. female presenting with DKA . PMH is significant for juvenile type 1 diabetes, asthma, and menorrhagia.  Moderate DKA: Resolving. History of type 1 diabetes. Last admission for DKA per Epic was in December of 2014. Has had multiple admissions for DKA per patient. Likely developed DKA from missing her PM Lantus dose on 10/5. Home insulin regimen per patient: 20 units of Lantus qhs and 20 units qam, Humalog 2-10 units TID with meals.  AG closed x 1. Patient feeling a little better this AM, does have some heart burn and abdominal pain.  -Continue to monitor in SDU -Vital signs per floor protocol -Fluids per DKA protocol: Normal saline at maintenance rate for glucose greater than 250 and D5 half-normal saline at maintenance rates for glucose greater than 250. -Insulin drip per DKA protocol -Zofran when necessary for nausea and vomiting -CBGs every one hour -BMP every 4 hours -Strict I's and O's  -continuous cardiac monitoring  -hemoglobin A1c pending  Leukocytosis: Likely from stress of DKA , WBC 17.4 today. No other obvious source of infection -Continue to monitor daily CBCs  Abdominal Pain and heartburn: Likely from DKA and not tolerating PO - Add PPI - Add Tylenol 650 mg q6 PRN  Hypertension: Hypertensive to 165/96 this AM and has been in 150-160 range overnight. Possibly due to increased abdominal pain - Add PRN Hydralazine with parameters - Will add Lisinopril 5 mg daily - Continue to monitor  AKI: Resolved -Daily BMP  ? Left Foot  fungus: Possible tinea pedis on foot -Lotrimin cream twice a day   Hx of asthma: No home medications - Albuterol PRN  FEN/GI: Fluids per DKA protocol, carb modified diet Prophylaxis: Lovenox  Disposition: Continue to monitor in SDU  Subjective:  No acute events overnight. Patient remained on DKA protocol with fluids and insulin drip. Patient states that she feels a little bit better compared to yesterday. Her abdominal pain is still present but improves with heat pads. She continues to have some nausea and vomited a couple hours ago. Denies any diarrhea. Denies any chest pain, shortness of breath, palpitations. Admits to some heartburn symptoms.  Objective: Temp:  [97.6 F (36.4 C)-98.1 F (36.7 C)] 98.1 F (36.7 C) (10/06 1506) Pulse Rate:  [98-127] 98 (10/07 0730) Resp:  [9-18] 9 (10/07 0730) BP: (123-167)/(67-100) 165/96 (10/07 0730) SpO2:  [89 %-100 %] 100 % (10/07 0730) Physical Exam: General: In NAD, laying in bed on her side asleep but awoke easily Cardiovascular: RRR, no murmurs Respiratory: CTAB, normal work of breathing Abdomen: Soft, nondistended, tender to deep palpation in all 4 quadrants, normal bowel sounds Extremities: No edema Skin: Continues to have rash on left foot  Laboratory:  Recent Labs Lab 01/20/16 1250 01/21/16 0327  WBC 15.3* 17.4*  HGB 14.3 13.4  HCT 43.0 41.3  PLT 422* 412*    Recent Labs Lab 01/21/16 0029 01/21/16 0327 01/21/16 0725  NA 141 140 139  K 4.1 4.1 3.6  CL 114* 113* 114*  CO2 13* 11* 16*  BUN 9 8 6   CREATININE 1.08* 0.95 0.83  CALCIUM 9.5 9.5 9.5  GLUCOSE 176* 188* 180*    Urinalysis    Component Value Date/Time   COLORURINE YELLOW 01/20/2016 1301   APPEARANCEUR CLEAR 01/20/2016 1301   LABSPEC >1.030 (H) 01/20/2016 1301   PHURINE 5.5 01/20/2016 1301   GLUCOSEU 500 (A) 01/20/2016 1301   HGBUR SMALL (A) 01/20/2016 1301   BILIRUBINUR MODERATE (A) 01/20/2016 1301   BILIRUBINUR Negative 11/12/2012 1238    KETONESUR >80 (A) 01/20/2016 1301   PROTEINUR 30 (A) 01/20/2016 1301   UROBILINOGEN 1.0 10/28/2014 2045   NITRITE NEGATIVE 01/20/2016 1301   LEUKOCYTESUR NEGATIVE 01/20/2016 1301     Imaging/Diagnostic Tests: None  Beaulah Dinninghristina M Gambino, MD 01/21/2016, 9:12 AM PGY-2, [place] [facility] FPTS Intern pager: [phone], text pages welcome

## 2016-01-21 NOTE — Plan of Care (Signed)
Problem: Pain Managment: Goal: General experience of comfort will improve Outcome: Progressing Discussed pain management and nausea signs and symptoms with the patient and teach back displayed

## 2016-01-21 NOTE — ED Notes (Signed)
Attempted to call report to 2C x 1. 

## 2016-01-21 NOTE — Progress Notes (Signed)
01/21/2016 [facility] at [facility]30. She is alert, oriented and ambulatory. She was admitted with DKA and on Glucostablizer. She have discoloration on right elbow skin is pink, sacrum no breakdown, right outer foot she have a red area and, she stated it does not hurt and bilateral heels dry. [facility] Avelynn Sellin RN.

## 2016-01-21 NOTE — Telephone Encounter (Signed)
error 

## 2016-01-22 LAB — BASIC METABOLIC PANEL
ANION GAP: 8 (ref 5–15)
Anion gap: 8 (ref 5–15)
BUN: <5 mg/dL — ABNORMAL LOW (ref 6–20)
BUN: <5 mg/dL — ABNORMAL LOW (ref 6–20)
CHLORIDE: 105 mmol/L (ref 101–111)
CHLORIDE: 106 mmol/L (ref 101–111)
CO2: 19 mmol/L — ABNORMAL LOW (ref 22–32)
CO2: 21 mmol/L — ABNORMAL LOW (ref 22–32)
Calcium: 9 mg/dL (ref 8.9–10.3)
Calcium: 9 mg/dL (ref 8.9–10.3)
Creatinine, Ser: 0.51 mg/dL (ref 0.44–1.00)
Creatinine, Ser: 0.6 mg/dL (ref 0.44–1.00)
GFR calc Af Amer: >60 mL/min (ref >60)
GFR calc non Af Amer: >60 mL/min (ref >60)
GLUCOSE: 136 mg/dL — AB (ref 65–99)
Glucose, Bld: 170 mg/dL — ABNORMAL HIGH (ref 65–99)
POTASSIUM: 2.9 mmol/L — AB (ref 3.5–5.1)
POTASSIUM: 3.6 mmol/L (ref 3.5–5.1)
SODIUM: 135 mmol/L (ref 135–145)
Sodium: 132 mmol/L — ABNORMAL LOW (ref 135–145)

## 2016-01-22 LAB — GLUCOSE, CAPILLARY
GLUCOSE-CAPILLARY: 217 mg/dL — AB (ref 65–99)
GLUCOSE-CAPILLARY: 192 mg/dL — AB (ref 65–99)
GLUCOSE-CAPILLARY: 220 mg/dL — AB (ref 65–99)

## 2016-01-22 LAB — CBC
HCT: 39.3 % (ref 36.0–46.0)
HEMOGLOBIN: 12.9 g/dL (ref 12.0–15.0)
MCH: 29.6 pg (ref 26.0–34.0)
MCHC: 32.8 g/dL (ref 30.0–36.0)
MCV: 90.1 fL (ref 78.0–100.0)
Platelets: 334 10*3/uL (ref 150–400)
RBC: 4.36 MIL/uL (ref 3.87–5.11)
RDW: 13.2 % (ref 11.5–15.5)
WBC: 8.8 10*3/uL (ref 4.0–10.5)

## 2016-01-22 LAB — HCG, SERUM, QUALITATIVE: PREG SERUM: NEGATIVE

## 2016-01-22 LAB — HEMOGLOBIN A1C
HEMOGLOBIN A1C: 11.2 % — AB (ref 4.8–5.6)
MEAN PLASMA GLUCOSE: 275 mg/dL

## 2016-01-22 LAB — MAGNESIUM: MAGNESIUM: 1.8 mg/dL (ref 1.7–2.4)

## 2016-01-22 MED ORDER — CLOTRIMAZOLE 1 % EX CREA: TOPICAL_CREAM | Freq: Two times a day (BID) | CUTANEOUS | 0 refills | Status: DC

## 2016-01-22 MED ORDER — POTASSIUM CHLORIDE CRYS ER 20 MEQ PO TBCR
40.0000 meq | EXTENDED_RELEASE_TABLET | Freq: Two times a day (BID) | ORAL | Status: DC
  Filled 2016-01-22: qty 2

## 2016-01-22 MED ORDER — LISINOPRIL 5 MG PO TABS: 5.0000 mg | ORAL_TABLET | Freq: Every day | ORAL | 0 refills | Status: DC

## 2016-01-22 MED ORDER — POTASSIUM CHLORIDE CRYS ER 10 MEQ PO TBCR
40.0000 meq | EXTENDED_RELEASE_TABLET | Freq: Two times a day (BID) | ORAL | Status: AC
  Administered 2016-01-22 (×2): 40 meq via ORAL
  Filled 2016-01-22 (×2): qty 4

## 2016-01-22 MED ORDER — INSULIN DETEMIR 100 UNIT/ML ~~LOC~~ SOLN: 15.0000 [IU] | Freq: Two times a day (BID) | SUBCUTANEOUS | 1 refills | Status: DC

## 2016-01-22 NOTE — Progress Notes (Signed)
01/22/2016 influenza vaccine given in right deltoid at 0934. Lot #: [id number] and expire 09/20/2016. [facility] Skow.

## 2016-01-22 NOTE — Progress Notes (Signed)
Pt transferred from [facility] this evening but MD came and discussed with pt not long after pt had been roomed about discharge home. Pt agreeable to discharge. Given discharge instructions and voiced understanding the teaching before leaving the unit in stable condition with her mom

## 2016-01-22 NOTE — Discharge Summary (Signed)
Family Medicine Teaching [facility] Discharge Summary  Patient name: [name] Medical record number: [id number] Date of birth: Sep 04, 1990 Age: 25 y.o. Gender: female Date of Admission: 01/20/2016  Date of Discharge: 01/22/2016 Admitting Physician: Latrelle Dodrill, MD  Primary Care Provider: Jeanann Lewandowsky, MD Consultants: None  Indication for Hospitalization: DKA  Discharge Diagnoses/Problem List:  Patient Active Problem List   Diagnosis Date Noted  . Generalized abdominal pain   . Abdominal pain, epigastric 04/10/2013  . Nausea and vomiting 04/10/2013  . Type 1 diabetes mellitus with ketoacidosis without coma (HCC) 11/26/2012  . Menorrhagia with irregular cycle 11/26/2012  . Encounter for Norplant removal 11/26/2012  . Hyperglycemia 11/13/2012  . Type 1 diabetes mellitus (HCC) 11/13/2012  . DKA (diabetic ketoacidoses) (HCC) 04/29/2011  . Noncompliance 04/29/2011   Disposition: Home  Discharge Condition: Stable  Discharge Exam:  General: In NAD, sitting up in bed Cardiovascular: RRR, no m/r/g Respiratory: CTAB, normal work of breathing Abdomen: Soft, nondistended, no tenderness on palpation, +bs Extremities: No edema, FROMx4 Skin: hyperpigmented dry lesion on dorsum of left foot - tinea pedis  Brief Hospital Course:  [name] is a 25 y.o. female presenting with DKA . PMH is significant for juvenile type 1 diabetes, asthma, and menorrhagia.  DKA Patient with history of type 1 diabetes.  Has had multiple admissions for DKA per patient. Likely developed DKA from missing her PM Lantus dose on 10/5. Initial glucose in  ED 284, with an anion gap of 25, serum bicarbonate 10. UA was a dirty catch that showed few bacteria negative leukocytes and nitrites, 6-30 squamous. Received 2 L normal saline in the ED. On admission was alert and oriented x3 , but slightly drowsy. Patient was admitted to stepdown unit and fluids and insulin drip was started per DKA  protocol.  CBGs were monitored and potassium was repleted as needed.  Patient eventually transitioned to subcut insulin and fluids were stopped once AG closed and she was tolerating PO well.  No longer complaining of nausea at this time and was able to eat and drink.  Patient transferred to med-surg floor at this time and continued to do well.  At time of discharge, patient was stable and no longer complaining of abdominal pain .   Issues for Follow Up:  1. Follows with Dr. Marlowe Kays [facility]) 2. Will need to monitor blood pressures closely as new medications started  3. BMET  Significant Procedures: None  Significant Labs and Imaging:   Recent Labs Lab 01/20/16 1250 01/21/16 0327 01/22/16 0621  WBC 15.3* 17.4* 8.8  HGB 14.3 13.4 12.9  HCT 43.0 41.3 39.3  PLT 422* 412* 334    Recent Labs Lab 01/21/16 0327 01/21/16 0725 01/21/16 1104 01/21/16 1555 01/21/16 2339 01/22/16 0256 01/22/16 0621  NA 140 139 137 136 132*  --  135  K 4.1 3.6 3.3* 4.1 2.9*  --  3.6  CL 113* 114* 110 107 105  --  106  CO2 11* 16* 18* 17* 19*  --  21*  GLUCOSE 188* 180* 154* 143* 136*  --  170*  BUN 8 6 <5* <5* <5*  --  <5*  CREATININE 0.95 0.83 0.74 0.61 0.60  --  0.51  CALCIUM 9.5 9.5 9.4 9.3 9.0  --  9.0  MG 2.2  --   --   --   --  1.8  --   PHOS 3.1  --   --   --   --   --   --  Results/Tests Pending at Time of Discharge: None  Discharge Medications:    Medication List    STOP taking these medications   insulin glargine 100 UNIT/ML injection Commonly known as:  LANTUS     TAKE these medications   clotrimazole 1 % cream Commonly known as:  LOTRIMIN Apply topically 2 (two) times daily.   insulin detemir 100 UNIT/ML injection Commonly known as:  LEVEMIR Inject 0.15 mLs (15 Units total) into the skin 2 (two) times daily.   insulin lispro 100 UNIT/ML injection Commonly known as:  HUMALOG Inject 2-10 Units into the skin 3 (three) times daily with meals.   lisinopril 5 MG  tablet Commonly known as:  PRINIVIL,ZESTRIL Take 1 tablet (5 mg total) by mouth daily. Start taking on:  01/23/2016      Discharge Instructions: Please refer to Patient Instructions section of EMR for full details.  Patient was counseled important signs and symptoms that should prompt return to medical care, changes in medications, dietary instructions, activity restrictions, and follow up appointments.   Follow-Up Appointments: Follow-up Information    Jeanann LewandowskyJEGEDE, OLUGBEMIGA, MD. Call today.   Specialty:  Internal Medicine Why:  to make a hospital follow up appointment within the next week Contact information: [address] [place] [address] [phone]          Freddrick MarchYashika Layani Foronda, MD 01/22/2016, 6:52 PM PGY-1, [facility]

## 2016-01-22 NOTE — Discharge Instructions (Signed)
You were admitted to the hospital for DKA.  You were given insulin by drip and fluids as you recovered.  Your potassium was repleted as we treated you for the DKA. Once you were stable, you were moved from the stepdown unit to the floor.  Your fluids were stopped and you were transitioned to regular insulin once you were no longer nauseous.  You will be discharged on Levemir and a cream for your foot.  We are glad you are doing so well.  Please follow up with your PCP within the next week.  Thank you for allowing us to participate in your care!

## 2016-01-22 NOTE — Progress Notes (Signed)
Family Medicine Teaching Service Daily Progress Note Intern Pager: [phone]  Patient name: [name] Medical record number: [id number] Date of birth: Mar 31, 1991 Age: 25 y.o. Gender: female  Primary Care Provider: Jeanann Lewandowsky, MD Consultants: None Code Status: Full  Pt Overview and Major Events to Date:  10/6: Admit for DKA, IVFs, insulin gtt 10/7: transitioned to SQ insulin 10/8 : transfer to floor from SDU  Assessment and Plan: Meria Kushnir is a 26 y.o. female presenting with DKA . PMH is significant for juvenile type 1 diabetes, asthma, and menorrhagia.  Moderate DKA: Resolving. History of type 1 diabetes. Last admission for DKA per Epic was in December of 2014. Has had multiple admissions for DKA per patient. Likely developed DKA from missing her PM Lantus dose on 10/5. Home insulin regimen per patient: 20 units of Lantus qhs and 20 units qam, Humalog 2-10 units TID with meals.  AG closed x 1. CBGs stable overnight ~130s.  Patient feeling a little better this AM, does have some mild abdominal pain. -Can transfer to floor from SDU today. If better likely discharge home later today. -Vital signs per routine -On fluids per DKA protocol: Normal saline at maintenance rate for glucose greater than 250 and D5 half-normal saline at maintenance rates for glucose greater than 250 - Discontinue fluids today since tolerating po -Zofran when necessary for nausea and vomiting -CBGs QID, before meals + at bedtime -BMP every 4 hours -Strict I's and O's  -continuous cardiac monitoring  -hemoglobin A1c pending  Leukocytosis, Resolved.  Likely from stress of DKA , WBC 17.4. With no other obvious source of infection. -WBC this AM wnl 8.8 - monitor daily CBCs  Abdominal Pain and heartburn: Likely from DKA and not tolerating PO. - Add PPI protonix - Add Tylenol 650 mg q6 PRN  Hypertension: Hypertensive to 145/91 this AM and has been fairly stable overnight (sbp ~120) with one  reading 167/98.  Possibly due to abdominal pain.  - Hydralazine 5mg  with parameters (for sbp > 170 and dbp >110) - Start Lisinopril 5 mg daily - Continue to monitor  AKI: Resolved -Daily BMP -Cr this AM: 0.51  ? Left Foot fungus: Possible tinea pedis on foot -Lotrimin cream twice a day   Hx of asthma: No home medications - Albuterol PRN  FEN/GI: Fluids per DKA protocol, carb modified diet Prophylaxis: Lovenox  Disposition: Can transfer to floor today   Subjective:  Patient states that she feels a little bit better compared to yesterday. Complaining of slight abdominal pain which prevents her from eating as much as she would like but is now tolerating po.  Eating and drinking fluids without nausea and is able to keep everything down as opposed to yesterday.   Denies any chest pain, shortness of breath, palpitations.  Objective: Temp:  [97.7 F (36.5 C)-98.5 F (36.9 C)] 98.1 F (36.7 C) (10/08 0834) Pulse Rate:  [89-101] 95 (10/08 0834) Resp:  [12-22] 14 (10/08 0834) BP: (112-167)/(68-98) 145/91 (10/08 0834) SpO2:  [99 %-100 %] 100 % (10/08 0834) Weight:  [146 lb (66.2 kg)] 146 lb (66.2 kg) (10/07 0921) Physical Exam: General: In NAD, sitting up in bed Cardiovascular: RRR, no murmurs Respiratory: CTAB, normal work of breathing Abdomen: Soft, nondistended, no tenderness on palpation, +bs Extremities: No edema Skin: Continues to have rash on left foot  Laboratory:  Recent Labs Lab 01/20/16 1250 01/21/16 0327 01/22/16 0621  WBC 15.3* 17.4* 8.8  HGB 14.3 13.4 12.9  HCT 43.0 41.3 39.3  PLT 422*  412* 334    Recent Labs Lab 01/21/16 1555 01/21/16 2339 01/22/16 0621  NA 136 132* 135  K 4.1 2.9* 3.6  CL 107 105 106  CO2 17* 19* 21*  BUN <5* <5* <5*  CREATININE 0.61 0.60 0.51  CALCIUM 9.3 9.0 9.0  GLUCOSE 143* 136* 170*   Urinalysis    Component Value Date/Time   COLORURINE YELLOW 01/20/2016 1301   APPEARANCEUR CLEAR 01/20/2016 1301   LABSPEC >1.030  (H) 01/20/2016 1301   PHURINE 5.5 01/20/2016 1301   GLUCOSEU 500 (A) 01/20/2016 1301   HGBUR SMALL (A) 01/20/2016 1301   BILIRUBINUR MODERATE (A) 01/20/2016 1301   BILIRUBINUR Negative 11/12/2012 1238   KETONESUR >80 (A) 01/20/2016 1301   PROTEINUR 30 (A) 01/20/2016 1301   UROBILINOGEN 1.0 10/28/2014 2045   NITRITE NEGATIVE 01/20/2016 1301   LEUKOCYTESUR NEGATIVE 01/20/2016 1301   Imaging/Diagnostic Tests:  No results found.  Freddrick MarchYashika Kimberlea Schlag, MD 01/22/2016, 9:01 AM PGY-1, [place] [facility] FPTS Intern pager: [phone], text pages welcome

## 2016-02-10 ENCOUNTER — Other Ambulatory Visit (INDEPENDENT_AMBULATORY_CARE_PROVIDER_SITE_OTHER): Payer: 59

## 2016-02-10 ENCOUNTER — Other Ambulatory Visit: Payer: Self-pay | Admitting: *Deleted

## 2016-02-10 ENCOUNTER — Ambulatory Visit (INDEPENDENT_AMBULATORY_CARE_PROVIDER_SITE_OTHER): Payer: 59 | Admitting: Nurse Practitioner

## 2016-02-10 ENCOUNTER — Encounter: Payer: Self-pay | Admitting: Nurse Practitioner

## 2016-02-10 VITALS — BP 116/82 | HR 100 | Temp 98.6°F | Ht 64.0 in | Wt 143.0 lb

## 2016-02-10 DIAGNOSIS — N921 Excessive and frequent menstruation with irregular cycle: Secondary | ICD-10-CM

## 2016-02-10 DIAGNOSIS — E108 Type 1 diabetes mellitus with unspecified complications: Secondary | ICD-10-CM

## 2016-02-10 DIAGNOSIS — Z13220 Encounter for screening for lipoid disorders: Secondary | ICD-10-CM | POA: Diagnosis not present

## 2016-02-10 DIAGNOSIS — Z136 Encounter for screening for cardiovascular disorders: Secondary | ICD-10-CM

## 2016-02-10 DIAGNOSIS — I10 Essential (primary) hypertension: Secondary | ICD-10-CM | POA: Diagnosis not present

## 2016-02-10 DIAGNOSIS — E559 Vitamin D deficiency, unspecified: Secondary | ICD-10-CM

## 2016-02-10 HISTORY — DX: Essential (primary) hypertension: I10

## 2016-02-10 LAB — CBC WITH DIFFERENTIAL/PLATELET
BASOS ABS: 0 10*3/uL (ref 0.0–0.1)
BASOS PCT: 0.4 % (ref 0.0–3.0)
EOS PCT: 1.2 % (ref 0.0–5.0)
Eosinophils Absolute: 0.1 10*3/uL (ref 0.0–0.7)
HEMATOCRIT: 41.1 % (ref 36.0–46.0)
Hemoglobin: 13.6 g/dL (ref 12.0–15.0)
LYMPHS PCT: 23.3 % (ref 12.0–46.0)
Lymphs Abs: 2 10*3/uL (ref 0.7–4.0)
MCHC: 33.2 g/dL (ref 30.0–36.0)
MCV: 90 fl (ref 78.0–100.0)
MONOS PCT: 5.9 % (ref 3.0–12.0)
Monocytes Absolute: 0.5 10*3/uL (ref 0.1–1.0)
NEUTROS ABS: 5.8 10*3/uL (ref 1.4–7.7)
Neutrophils Relative %: 69.2 % (ref 43.0–77.0)
PLATELETS: 370 10*3/uL (ref 150.0–400.0)
RBC: 4.56 Mil/uL (ref 3.87–5.11)
RDW: 14.1 % (ref 11.5–15.5)
WBC: 8.4 10*3/uL (ref 4.0–10.5)

## 2016-02-10 LAB — LIPID PANEL
CHOL/HDL RATIO: 3
Cholesterol: 206 mg/dL — ABNORMAL HIGH (ref 0–200)
HDL: 76.5 mg/dL (ref >39.00)
LDL Cholesterol: 117 mg/dL — ABNORMAL HIGH (ref 0–99)
NONHDL: 129.19
Triglycerides: 62 mg/dL (ref 0.0–149.0)
VLDL: 12.4 mg/dL (ref 0.0–40.0)

## 2016-02-10 MED ORDER — LISINOPRIL 5 MG PO TABS: 5.0000 mg | ORAL_TABLET | Freq: Every day | ORAL | 6 refills | Status: DC

## 2016-02-10 NOTE — Progress Notes (Signed)
Pre visit review using our clinic review tool, if applicable. No additional management support is needed unless otherwise documented below in the visit note. 

## 2016-02-10 NOTE — Progress Notes (Signed)
Subjective:    Patient ID: [name], female    DOB: 02/07/1991, 25 y.o.   MRN: [id number]  Patient presents today for complete physical or establish care (new patient).  HPI  she denies any acute complains today.  Patient presents today for follow up of diabetes: Diagnosed with Diabetes at age 25. Patient denies the following symptoms: hypoglycemic events, polyuria, polydipsia, polyphagia, numbness/tingling in extremities, dyspnea, chest pain or change in vision. Patient is on the following medications:humalog and levemir Sugars at home have been trending as follows:170s-200s Patient reports compliance with medications.  Patient reports daily foot check. Pt is currently maintained on the following medications for diabetes: humalog and levemir Last A1C was 11 Last diabetic eye exam was 10/2015 Deferred, opthalmologist record requested MD Dr. Kalman Jewels with [facility] endocrinology  DM - Patient checking blood sugars _4_ times daily, before meals and _bedtime_. Reports fasting blood sugars of > 200 mg/dL. Marland Kitchen occassional hypoglycemic episodes since last hospitalization. Last hypoglycemic episode was 2days ago (50s in morning) denies polyuria, polydipsia, nausea, vomiting, diarrhea.  does not request refills today.  Diabetes managed by endocrinologist (dr. Abner Greenspan). Levemir dose was decreased on Monday   Immunizations: (TDAP, Hep C screen, Pneumovax, Influenza, zoster)  Health Maintenance  Topic Date Due  . Complete foot exam   04/06/2001  . Eye exam for diabetics  04/06/2001  . HIV Screening  04/06/2006  . Tetanus Vaccine  04/06/2010  . Pap Smear  04/06/2012  . Pneumococcal vaccine (2) 04/29/2016  . Hemoglobin A1C  07/21/2016  . Flu Shot  Completed   Diet: Weight:  Wt Readings from Last 3 Encounters:  02/10/16 143 lb (64.9 kg)  01/21/16 146 lb (66.2 kg)  07/23/15 134 lb (60.8 kg)   Exercise:none Fall Risk: Fall Risk  02/10/2016 11/12/2012  Falls in the past year? No No    Depression/Suicide: Depression screen [facility] 2/9 02/10/2016 11/12/2012  Decreased Interest 0 0  Down, Depressed, Hopeless 0 0  PHQ - 2 Score 0 0   No flowsheet data found. Pap Smear (every 10yr for >21-29 without HPV, every 532yrfor >30-6541yrith HPV):up to date Vision:up to date, record requested Dental:up to date Advanced Directive: Advanced Directives 02/10/2016  Does patient have an advance directive? No  Would patient like information on creating an advanced directive? No - patient declined information  Pre-existing out of facility DNR order (yellow form or pink MOST form) -   Sexual History (birth control, marital status, STD): current use of nexplanon  Medications and allergies reviewed with patient and updated if appropriate.  Patient Active Problem List   Diagnosis Date Noted  . Vitamin D deficiency 02/10/2016  . Essential hypertension, benign 02/10/2016  . Generalized abdominal pain   . Abdominal pain, epigastric 04/10/2013  . Nausea and vomiting 04/10/2013  . Type 1 diabetes mellitus with ketoacidosis without coma ([place]/13/2014  . Menorrhagia with irregular cycle 11/26/2012  . Encounter for Norplant removal 11/26/2012  . Hyperglycemia 11/13/2012  . Type 1 diabetes mellitus ([facility]/31/2014  . DKA (diabetic ketoacidoses) ([place]/13/2013  . Noncompliance 04/29/2011    Current Outpatient Prescriptions on File Prior to Visit  Medication Sig Dispense Refill  . insulin detemir (LEVEMIR) 100 UNIT/ML injection Inject 0.15 mLs (15 Units total) into the skin 2 (two) times daily. 10 mL 1  . insulin lispro (HUMALOG) 100 UNIT/ML injection Inject 2-10 Units into the skin 3 (three) times daily with meals.     No current facility-administered medications on file  prior to visit.     Past Medical History:  Diagnosis Date  . Asthma   . Diabetes mellitus    type 1   . Essential hypertension, benign 02/10/2016    History reviewed. No pertinent surgical  history.  Social History   Social History  . Marital status: Single    Spouse name: N/A  . Number of children: N/A  . Years of education: N/A   Social History Main Topics  . Smoking status: Current Some Day Smoker  . Smokeless tobacco: Current User  . Alcohol use No  . Drug use: No  . Sexual activity: Yes   Other Topics Concern  . None   Social History Narrative  . None    Family History  Problem Relation Age of Onset  . Hypertension Paternal Grandmother         Review of Systems  Constitutional: Negative for fever, malaise/fatigue and weight loss.  HENT: Negative for congestion and sore throat.   Eyes:       Negative for visual changes  Respiratory: Negative for cough and shortness of breath.   Cardiovascular: Negative for chest pain, palpitations and leg swelling.  Gastrointestinal: Negative for blood in stool, constipation, diarrhea and heartburn.  Genitourinary: Negative for dysuria, frequency and urgency.  Musculoskeletal: Negative for falls, joint pain and myalgias.  Skin: Negative for rash.  Neurological: Negative for dizziness, sensory change and headaches.  Endo/Heme/Allergies: Does not bruise/bleed easily.  Psychiatric/Behavioral: Negative for depression, substance abuse and suicidal ideas. The patient is not nervous/anxious.     Objective:   Vitals:   02/10/16 1057  BP: 116/82  Pulse: 100  Temp: 98.6 F (37 C)    Body mass index is 24.55 kg/m.   Physical Examination:  Physical Exam  Constitutional: She is oriented to person, place, and time and well-developed, well-nourished, and in no distress. No distress.  HENT:  Right Ear: External ear normal.  Left Ear: External ear normal.  Nose: Nose normal.  Mouth/Throat: Oropharynx is clear and moist. No oropharyngeal exudate.  Eyes: Conjunctivae and EOM are normal. Pupils are equal, round, and reactive to light. No scleral icterus.  Neck: Normal range of motion. Neck supple. No thyromegaly  present.  Cardiovascular: Normal rate, normal heart sounds and intact distal pulses.   Pulmonary/Chest: Effort normal and breath sounds normal. She exhibits no tenderness.  Abdominal: Soft. Bowel sounds are normal. She exhibits no distension. There is no tenderness.  Musculoskeletal: Normal range of motion. She exhibits no edema or tenderness.  Lymphadenopathy:    She has no cervical adenopathy.  Neurological: She is alert and oriented to person, place, and time. Gait normal.  Skin: Skin is warm and dry.  Psychiatric: Affect and judgment normal.    ASSESSMENT and PLAN:  [name] was seen today for annual exam.  Diagnoses and all orders for this visit:  Essential hypertension, benign -     lisinopril (PRINIVIL,ZESTRIL) 5 MG tablet; Take 1 tablet (5 mg total) by mouth daily.  Vitamin D deficiency -     Vitamin D 1,25 dihydroxy; Future  Menorrhagia with irregular cycle -     TSH -     CBC w/Diff; Future  Encounter for lipid screening for cardiovascular disease -     Lipid panel; Future  Type 1 diabetes mellitus with complication (HCC) -     Amb ref to Medical Nutrition Therapy-MNT    No problem-specific Assessment & Plan notes found for this encounter.    Recent Results (  from the past 2160 hour(s))  CBG monitoring, ED     Status: Abnormal   Collection Time: 01/20/16 12:44 PM  Result Value Ref Range   Glucose-Capillary 272 (H) 65 - 99 mg/dL  Basic metabolic panel     Status: Abnormal   Collection Time: 01/20/16 12:50 PM  Result Value Ref Range   Sodium 141 135 - 145 mmol/L   Potassium 3.9 3.5 - 5.1 mmol/L   Chloride 106 101 - 111 mmol/L   CO2 10 (L) 22 - 32 mmol/L   Glucose, Bld 284 (H) 65 - 99 mg/dL   BUN 18 6 - 20 mg/dL   Creatinine, Ser 1.61 (H) 0.44 - 1.00 mg/dL   Calcium 10.7 (H) 8.9 - 10.3 mg/dL   GFR calc non Af Amer 44 (L) >60 mL/min   GFR calc Af Amer 51 (L) >60 mL/min    Comment: (NOTE) The eGFR has been calculated using the CKD EPI equation. This  calculation has not been validated in all clinical situations. eGFR's persistently <60 mL/min signify possible Chronic Kidney Disease.    Anion gap 25 (H) 5 - 15  CBC     Status: Abnormal   Collection Time: 01/20/16 12:50 PM  Result Value Ref Range   WBC 15.3 (H) 4.0 - 10.5 K/uL   RBC 4.70 3.87 - 5.11 MIL/uL   Hemoglobin 14.3 12.0 - 15.0 g/dL   HCT 43.0 36.0 - 46.0 %   MCV 91.5 78.0 - 100.0 fL   MCH 30.4 26.0 - 34.0 pg   MCHC 33.3 30.0 - 36.0 g/dL   RDW 13.5 11.5 - 15.5 %   Platelets 422 (H) 150 - 400 K/uL  Lipase, blood     Status: None   Collection Time: 01/20/16 12:50 PM  Result Value Ref Range   Lipase 36 11 - 51 U/L  Urinalysis, Routine w reflex microscopic     Status: Abnormal   Collection Time: 01/20/16  1:01 PM  Result Value Ref Range   Color, Urine YELLOW YELLOW   APPearance CLEAR CLEAR   Specific Gravity, Urine >1.030 (H) 1.005 - 1.030   pH 5.5 5.0 - 8.0   Glucose, UA 500 (A) NEGATIVE mg/dL   Hgb urine dipstick SMALL (A) NEGATIVE   Bilirubin Urine MODERATE (A) NEGATIVE   Ketones, ur >80 (A) NEGATIVE mg/dL   Protein, ur 30 (A) NEGATIVE mg/dL   Nitrite NEGATIVE NEGATIVE   Leukocytes, UA NEGATIVE NEGATIVE  Urine microscopic-add on     Status: Abnormal   Collection Time: 01/20/16  1:01 PM  Result Value Ref Range   Squamous Epithelial / LPF 6-30 (A) NONE SEEN   WBC, UA 0-5 0 - 5 WBC/hpf   RBC / HPF 0-5 0 - 5 RBC/hpf   Bacteria, UA FEW (A) NONE SEEN   Casts HYALINE CASTS (A) NEGATIVE  Pregnancy, urine     Status: Abnormal   Collection Time: 01/20/16  1:01 PM  Result Value Ref Range   Preg Test, Ur WEAK POSITIVE (A) NEGATIVE    Comment:        THE SENSITIVITY OF THIS METHODOLOGY IS >20 mIU/mL.   I-Stat beta hCG blood, ED     Status: None   Collection Time: 01/20/16  1:05 PM  Result Value Ref Range   I-stat hCG, quantitative <5.0 <5 mIU/mL   Comment 3            Comment:   GEST. AGE      CONC.  (mIU/mL)   <=  1 WEEK        5 - 50     2 WEEKS       50 - 500      3 WEEKS       100 - 10,000     4 WEEKS     1,000 - 30,000        FEMALE AND NON-PREGNANT FEMALE:     LESS THAN 5 mIU/mL   CBG monitoring, ED     Status: Abnormal   Collection Time: 01/20/16  5:01 PM  Result Value Ref Range   Glucose-Capillary 113 (H) 65 - 99 mg/dL  CBG monitoring, ED     Status: Abnormal   Collection Time: 01/20/16  6:54 PM  Result Value Ref Range   Glucose-Capillary 147 (H) 65 - 99 mg/dL  I-Stat venous blood gas, ED     Status: Abnormal   Collection Time: 01/20/16  7:02 PM  Result Value Ref Range   pH, Ven 7.278 7.250 - 7.430   pCO2, Ven 26.8 (L) 44.0 - 60.0 mmHg   pO2, Ven 37.0 32.0 - 45.0 mmHg   Bicarbonate 12.5 (L) 20.0 - 28.0 mmol/L   TCO2 13 0 - 100 mmol/L   O2 Saturation 65.0 %   Acid-base deficit 13.0 (H) 0.0 - 2.0 mmol/L   Patient temperature HIDE    Sample type VENOUS    Comment NOTIFIED PHYSICIAN   CBG monitoring, ED     Status: Abnormal   Collection Time: 01/20/16  8:55 PM  Result Value Ref Range   Glucose-Capillary 246 (H) 65 - 99 mg/dL  CBG monitoring, ED     Status: Abnormal   Collection Time: 01/20/16 10:40 PM  Result Value Ref Range   Glucose-Capillary 236 (H) 65 - 99 mg/dL  CBG monitoring, ED     Status: Abnormal   Collection Time: 01/20/16 11:51 PM  Result Value Ref Range   Glucose-Capillary 181 (H) 65 - 99 mg/dL  Basic metabolic panel     Status: Abnormal   Collection Time: 01/21/16 12:29 AM  Result Value Ref Range   Sodium 141 135 - 145 mmol/L   Potassium 4.1 3.5 - 5.1 mmol/L   Chloride 114 (H) 101 - 111 mmol/L   CO2 13 (L) 22 - 32 mmol/L   Glucose, Bld 176 (H) 65 - 99 mg/dL   BUN 9 6 - 20 mg/dL   Creatinine, Ser 1.08 (H) 0.44 - 1.00 mg/dL   Calcium 9.5 8.9 - 10.3 mg/dL   GFR calc non Af Amer >60 >60 mL/min   GFR calc Af Amer >60 >60 mL/min    Comment: (NOTE) The eGFR has been calculated using the CKD EPI equation. This calculation has not been validated in all clinical situations. eGFR's persistently <60 mL/min signify  possible Chronic Kidney Disease.    Anion gap 14 5 - 15  CBG monitoring, ED     Status: Abnormal   Collection Time: 01/21/16  1:08 AM  Result Value Ref Range   Glucose-Capillary 133 (H) 65 - 99 mg/dL  CBG monitoring, ED     Status: Abnormal   Collection Time: 01/21/16  2:15 AM  Result Value Ref Range   Glucose-Capillary 117 (H) 65 - 99 mg/dL  CBG monitoring, ED     Status: Abnormal   Collection Time: 01/21/16  3:19 AM  Result Value Ref Range   Glucose-Capillary 159 (H) 65 - 99 mg/dL  Basic metabolic panel     Status: Abnormal  Collection Time: 01/21/16  3:27 AM  Result Value Ref Range   Sodium 140 135 - 145 mmol/L   Potassium 4.1 3.5 - 5.1 mmol/L   Chloride 113 (H) 101 - 111 mmol/L   CO2 11 (L) 22 - 32 mmol/L   Glucose, Bld 188 (H) 65 - 99 mg/dL   BUN 8 6 - 20 mg/dL   Creatinine, Ser 0.95 0.44 - 1.00 mg/dL   Calcium 9.5 8.9 - 10.3 mg/dL   GFR calc non Af Amer >60 >60 mL/min   GFR calc Af Amer >60 >60 mL/min    Comment: (NOTE) The eGFR has been calculated using the CKD EPI equation. This calculation has not been validated in all clinical situations. eGFR's persistently <60 mL/min signify possible Chronic Kidney Disease.    Anion gap 16 (H) 5 - 15  CBC     Status: Abnormal   Collection Time: 01/21/16  3:27 AM  Result Value Ref Range   WBC 17.4 (H) 4.0 - 10.5 K/uL   RBC 4.52 3.87 - 5.11 MIL/uL   Hemoglobin 13.4 12.0 - 15.0 g/dL   HCT 41.3 36.0 - 46.0 %   MCV 91.4 78.0 - 100.0 fL   MCH 29.6 26.0 - 34.0 pg   MCHC 32.4 30.0 - 36.0 g/dL   RDW 13.5 11.5 - 15.5 %   Platelets 412 (H) 150 - 400 K/uL  Magnesium     Status: None   Collection Time: 01/21/16  3:27 AM  Result Value Ref Range   Magnesium 2.2 1.7 - 2.4 mg/dL  Phosphorus     Status: None   Collection Time: 01/21/16  3:27 AM  Result Value Ref Range   Phosphorus 3.1 2.5 - 4.6 mg/dL  Hemoglobin A1c     Status: Abnormal   Collection Time: 01/21/16  3:27 AM  Result Value Ref Range   Hgb A1c MFr Bld 11.2 (H) 4.8 -  5.6 %    Comment: (NOTE)         Pre-diabetes: 5.7 - 6.4         Diabetes: >6.4         Glycemic control for adults with diabetes: <7.0    Mean Plasma Glucose 275 mg/dL    Comment: (NOTE) Performed At: [facility] [place], [place] [id number] Lindon Romp MD WJ:[id number]   CBG monitoring, ED     Status: Abnormal   Collection Time: 01/21/16  4:24 AM  Result Value Ref Range   Glucose-Capillary 198 (H) 65 - 99 mg/dL  CBG monitoring, ED     Status: Abnormal   Collection Time: 01/21/16  5:27 AM  Result Value Ref Range   Glucose-Capillary 201 (H) 65 - 99 mg/dL  CBG monitoring, ED     Status: Abnormal   Collection Time: 01/21/16  6:29 AM  Result Value Ref Range   Glucose-Capillary 172 (H) 65 - 99 mg/dL  Basic metabolic panel     Status: Abnormal   Collection Time: 01/21/16  7:25 AM  Result Value Ref Range   Sodium 139 135 - 145 mmol/L   Potassium 3.6 3.5 - 5.1 mmol/L   Chloride 114 (H) 101 - 111 mmol/L   CO2 16 (L) 22 - 32 mmol/L   Glucose, Bld 180 (H) 65 - 99 mg/dL   BUN 6 6 - 20 mg/dL   Creatinine, Ser 0.83 0.44 - 1.00 mg/dL   Calcium 9.5 8.9 - 10.3 mg/dL   GFR calc non Af Amer >60 >60  mL/min   GFR calc Af Amer >60 >60 mL/min    Comment: (NOTE) The eGFR has been calculated using the CKD EPI equation. This calculation has not been validated in all clinical situations. eGFR's persistently <60 mL/min signify possible Chronic Kidney Disease.    Anion gap 9 5 - 15  CBG monitoring, ED     Status: Abnormal   Collection Time: 01/21/16  7:55 AM  Result Value Ref Range   Glucose-Capillary 185 (H) 65 - 99 mg/dL  Glucose, capillary     Status: Abnormal   Collection Time: 01/21/16  9:19 AM  Result Value Ref Range   Glucose-Capillary 133 (H) 65 - 99 mg/dL  MRSA PCR Screening     Status: None   Collection Time: 01/21/16  9:21 AM  Result Value Ref Range   MRSA by PCR NEGATIVE NEGATIVE    Comment:        The GeneXpert MRSA Assay (FDA approved for NASAL  specimens only), is one component of a comprehensive MRSA colonization surveillance program. It is not intended to diagnose MRSA infection nor to guide or monitor treatment for MRSA infections.   Glucose, capillary     Status: Abnormal   Collection Time: 01/21/16 10:28 AM  Result Value Ref Range   Glucose-Capillary 177 (H) 65 - 99 mg/dL  Basic metabolic panel     Status: Abnormal   Collection Time: 01/21/16 11:04 AM  Result Value Ref Range   Sodium 137 135 - 145 mmol/L   Potassium 3.3 (L) 3.5 - 5.1 mmol/L   Chloride 110 101 - 111 mmol/L   CO2 18 (L) 22 - 32 mmol/L   Glucose, Bld 154 (H) 65 - 99 mg/dL   BUN <5 (L) 6 - 20 mg/dL   Creatinine, Ser 0.74 0.44 - 1.00 mg/dL   Calcium 9.4 8.9 - 10.3 mg/dL   GFR calc non Af Amer >60 >60 mL/min   GFR calc Af Amer >60 >60 mL/min    Comment: (NOTE) The eGFR has been calculated using the CKD EPI equation. This calculation has not been validated in all clinical situations. eGFR's persistently <60 mL/min signify possible Chronic Kidney Disease.    Anion gap 9 5 - 15  Glucose, capillary     Status: Abnormal   Collection Time: 01/21/16 12:00 PM  Result Value Ref Range   Glucose-Capillary 150 (H) 65 - 99 mg/dL  Glucose, capillary     Status: Abnormal   Collection Time: 01/21/16  1:27 PM  Result Value Ref Range   Glucose-Capillary 149 (H) 65 - 99 mg/dL  Glucose, capillary     Status: Abnormal   Collection Time: 01/21/16  2:50 PM  Result Value Ref Range   Glucose-Capillary 157 (H) 65 - 99 mg/dL  Basic metabolic panel     Status: Abnormal   Collection Time: 01/21/16  3:55 PM  Result Value Ref Range   Sodium 136 135 - 145 mmol/L   Potassium 4.1 3.5 - 5.1 mmol/L   Chloride 107 101 - 111 mmol/L   CO2 17 (L) 22 - 32 mmol/L   Glucose, Bld 143 (H) 65 - 99 mg/dL   BUN <5 (L) 6 - 20 mg/dL   Creatinine, Ser 0.61 0.44 - 1.00 mg/dL   Calcium 9.3 8.9 - 10.3 mg/dL   GFR calc non Af Amer >60 >60 mL/min   GFR calc Af Amer >60 >60 mL/min     Comment: (NOTE) The eGFR has been calculated using the CKD EPI equation. This  calculation has not been validated in all clinical situations. eGFR's persistently <60 mL/min signify possible Chronic Kidney Disease.    Anion gap 12 5 - 15  Glucose, capillary     Status: Abnormal   Collection Time: 01/21/16  4:11 PM  Result Value Ref Range   Glucose-Capillary 126 (H) 65 - 99 mg/dL  Glucose, capillary     Status: Abnormal   Collection Time: 01/21/16  5:43 PM  Result Value Ref Range   Glucose-Capillary 139 (H) 65 - 99 mg/dL  Glucose, capillary     Status: Abnormal   Collection Time: 01/21/16  9:06 PM  Result Value Ref Range   Glucose-Capillary 131 (H) 65 - 99 mg/dL   Comment 1 Notify RN   Basic metabolic panel     Status: Abnormal   Collection Time: 01/21/16 11:39 PM  Result Value Ref Range   Sodium 132 (L) 135 - 145 mmol/L   Potassium 2.9 (L) 3.5 - 5.1 mmol/L    Comment: DELTA CHECK NOTED   Chloride 105 101 - 111 mmol/L   CO2 19 (L) 22 - 32 mmol/L   Glucose, Bld 136 (H) 65 - 99 mg/dL   BUN <5 (L) 6 - 20 mg/dL   Creatinine, Ser 0.60 0.44 - 1.00 mg/dL   Calcium 9.0 8.9 - 10.3 mg/dL   GFR calc non Af Amer >60 >60 mL/min   GFR calc Af Amer >60 >60 mL/min    Comment: (NOTE) The eGFR has been calculated using the CKD EPI equation. This calculation has not been validated in all clinical situations. eGFR's persistently <60 mL/min signify possible Chronic Kidney Disease.    Anion gap 8 5 - 15  hCG, serum, qualitative     Status: None   Collection Time: 01/21/16 11:39 PM  Result Value Ref Range   Preg, Serum NEGATIVE NEGATIVE    Comment:        THE SENSITIVITY OF THIS METHODOLOGY IS >10 mIU/mL.   Magnesium     Status: None   Collection Time: 01/22/16  2:56 AM  Result Value Ref Range   Magnesium 1.8 1.7 - 2.4 mg/dL  CBC     Status: None   Collection Time: 01/22/16  6:21 AM  Result Value Ref Range   WBC 8.8 4.0 - 10.5 K/uL   RBC 4.36 3.87 - 5.11 MIL/uL   Hemoglobin 12.9 12.0  - 15.0 g/dL   HCT 39.3 36.0 - 46.0 %   MCV 90.1 78.0 - 100.0 fL   MCH 29.6 26.0 - 34.0 pg   MCHC 32.8 30.0 - 36.0 g/dL   RDW 13.2 11.5 - 15.5 %   Platelets 334 150 - 400 K/uL  Basic metabolic panel     Status: Abnormal   Collection Time: 01/22/16  6:21 AM  Result Value Ref Range   Sodium 135 135 - 145 mmol/L   Potassium 3.6 3.5 - 5.1 mmol/L    Comment: DELTA CHECK NOTED   Chloride 106 101 - 111 mmol/L   CO2 21 (L) 22 - 32 mmol/L   Glucose, Bld 170 (H) 65 - 99 mg/dL   BUN <5 (L) 6 - 20 mg/dL   Creatinine, Ser 0.51 0.44 - 1.00 mg/dL   Calcium 9.0 8.9 - 10.3 mg/dL   GFR calc non Af Amer >60 >60 mL/min   GFR calc Af Amer >60 >60 mL/min    Comment: (NOTE) The eGFR has been calculated using the CKD EPI equation. This calculation has not been validated in  all clinical situations. eGFR's persistently <60 mL/min signify possible Chronic Kidney Disease.    Anion gap 8 5 - 15  Glucose, capillary     Status: Abnormal   Collection Time: 01/22/16  8:33 AM  Result Value Ref Range   Glucose-Capillary 217 (H) 65 - 99 mg/dL  Glucose, capillary     Status: Abnormal   Collection Time: 01/22/16 12:05 PM  Result Value Ref Range   Glucose-Capillary 192 (H) 65 - 99 mg/dL  Glucose, capillary     Status: Abnormal   Collection Time: 01/22/16  4:51 PM  Result Value Ref Range   Glucose-Capillary 220 (H) 65 - 99 mg/dL   Comment 1 Notify RN   CBC w/Diff     Status: None   Collection Time: 02/10/16 12:00 PM  Result Value Ref Range   WBC 8.4 4.0 - 10.5 K/uL   RBC 4.56 3.87 - 5.11 Mil/uL   Hemoglobin 13.6 12.0 - 15.0 g/dL   HCT 41.1 36.0 - 46.0 %   MCV 90.0 78.0 - 100.0 fl   MCHC 33.2 30.0 - 36.0 g/dL   RDW 14.1 11.5 - 15.5 %   Platelets 370.0 150.0 - 400.0 K/uL   Neutrophils Relative % 69.2 43.0 - 77.0 %   Lymphocytes Relative 23.3 12.0 - 46.0 %   Monocytes Relative 5.9 3.0 - 12.0 %   Eosinophils Relative 1.2 0.0 - 5.0 %   Basophils Relative 0.4 0.0 - 3.0 %   Neutro Abs 5.8 1.4 - 7.7 K/uL    Lymphs Abs 2.0 0.7 - 4.0 K/uL   Monocytes Absolute 0.5 0.1 - 1.0 K/uL   Eosinophils Absolute 0.1 0.0 - 0.7 K/uL   Basophils Absolute 0.0 0.0 - 0.1 K/uL  Lipid panel     Status: Abnormal   Collection Time: 02/10/16 12:00 PM  Result Value Ref Range   Cholesterol 206 (H) 0 - 200 mg/dL    Comment: ATP III Classification       Desirable:  < 200 mg/dL               Borderline High:  200 - 239 mg/dL          High:  > = 240 mg/dL   Triglycerides 62.0 0.0 - 149.0 mg/dL    Comment: Normal:  <150 mg/dLBorderline High:  150 - 199 mg/dL   HDL 76.50 >39.00 mg/dL   VLDL 12.4 0.0 - 40.0 mg/dL   LDL Cholesterol 117 (H) 0 - 99 mg/dL   Total CHOL/HDL Ratio 3     Comment:                Men          Women1/2 Average Risk     3.4          3.3Average Risk          5.0          4.42X Average Risk          9.6          7.13X Average Risk          15.0          11.0                       NonHDL 129.19     Comment: NOTE:  Non-HDL goal should be 30 mg/dL higher than patient's LDL goal (i.e. LDL goal of < 70 mg/dL, would have non-HDL goal of <  100 mg/dL)   Follow up: Return in about 6 months (around 08/10/2016) for DM and HTN.  Wilfred Lacy, NP

## 2016-02-10 NOTE — Patient Instructions (Addendum)
Maintain upcoming appt with endocrinologist. Maintain low carb and low sugar diet. Encourage adequate oral hydration.  Medical release to get records from GYN and opthalmologist.

## 2016-02-10 NOTE — Progress Notes (Unsigned)
Pre visit review using our clinic review tool, if applicable. No additional management support is needed unless otherwise documented below in the visit note. 

## 2016-02-12 LAB — VITAMIN D 1,25 DIHYDROXY
VITAMIN D3 1, 25 (OH): 35 pg/mL
Vitamin D 1, 25 (OH)2 Total: 35 pg/mL (ref 18–72)

## 2016-02-13 ENCOUNTER — Ambulatory Visit: Payer: 59 | Admitting: Nurse Practitioner

## 2016-02-14 ENCOUNTER — Telehealth: Payer: Self-pay

## 2016-02-14 DIAGNOSIS — N921 Excessive and frequent menstruation with irregular cycle: Secondary | ICD-10-CM

## 2016-02-14 NOTE — Telephone Encounter (Signed)
I have advised patient that thyroid lab did not result, per lab, order was not entered in future so lab did not appear as needed---patient will come back to have tsh drawn again, will prob come on Friday 11/3---I have entered order and we will call patient back after tsh lab results---routing to [place], fyi----thanks

## 2016-02-14 NOTE — Telephone Encounter (Signed)
Okay. Thank you.

## 2016-02-14 NOTE — Telephone Encounter (Signed)
error 

## 2016-03-09 ENCOUNTER — Emergency Department (HOSPITAL_COMMUNITY): Payer: 59

## 2016-03-09 ENCOUNTER — Emergency Department (HOSPITAL_COMMUNITY)
Admission: EM | Admit: 2016-03-09 | Discharge: 2016-03-09 | Disposition: A | Discharge status: Home / Self Care | Payer: 59 | Attending: Emergency Medicine | Admitting: Emergency Medicine

## 2016-03-09 ENCOUNTER — Encounter (HOSPITAL_COMMUNITY): Payer: Self-pay | Admitting: Emergency Medicine

## 2016-03-09 DIAGNOSIS — Z79899 Other long term (current) drug therapy: Secondary | ICD-10-CM | POA: Insufficient documentation

## 2016-03-09 DIAGNOSIS — I10 Essential (primary) hypertension: Secondary | ICD-10-CM | POA: Insufficient documentation

## 2016-03-09 DIAGNOSIS — E109 Type 1 diabetes mellitus without complications: Secondary | ICD-10-CM | POA: Insufficient documentation

## 2016-03-09 DIAGNOSIS — N3001 Acute cystitis with hematuria: Secondary | ICD-10-CM | POA: Insufficient documentation

## 2016-03-09 DIAGNOSIS — F17200 Nicotine dependence, unspecified, uncomplicated: Secondary | ICD-10-CM | POA: Insufficient documentation

## 2016-03-09 DIAGNOSIS — J45909 Unspecified asthma, uncomplicated: Secondary | ICD-10-CM | POA: Insufficient documentation

## 2016-03-09 DIAGNOSIS — R109 Unspecified abdominal pain: Secondary | ICD-10-CM

## 2016-03-09 LAB — URINALYSIS, ROUTINE W REFLEX MICROSCOPIC
BILIRUBIN URINE: NEGATIVE
Glucose, UA: >1000 mg/dL — AB
KETONES UR: 15 mg/dL — AB
Leukocytes, UA: NEGATIVE
NITRITE: NEGATIVE
PH: 6 (ref 5.0–8.0)
Protein, ur: 30 mg/dL — AB
SPECIFIC GRAVITY, URINE: 1.028 (ref 1.005–1.030)

## 2016-03-09 LAB — CBC
HEMATOCRIT: 38.2 % (ref 36.0–46.0)
HEMOGLOBIN: 13.1 g/dL (ref 12.0–15.0)
MCH: 29.6 pg (ref 26.0–34.0)
MCHC: 34.3 g/dL (ref 30.0–36.0)
MCV: 86.2 fL (ref 78.0–100.0)
Platelets: 351 10*3/uL (ref 150–400)
RBC: 4.43 MIL/uL (ref 3.87–5.11)
RDW: 12.1 % (ref 11.5–15.5)
WBC: 7.8 10*3/uL (ref 4.0–10.5)

## 2016-03-09 LAB — BLOOD GAS, VENOUS
ACID-BASE EXCESS: 0.6 mmol/L (ref 0.0–2.0)
Bicarbonate: 24.9 mmol/L (ref 20.0–28.0)
O2 SAT: 64.7 %
PATIENT TEMPERATURE: 98.6
PCO2 VEN: 41 mmHg — AB (ref 44.0–60.0)
PO2 VEN: 35.1 mmHg (ref 32.0–45.0)
pH, Ven: 7.401 (ref 7.250–7.430)

## 2016-03-09 LAB — URINE MICROSCOPIC-ADD ON

## 2016-03-09 LAB — COMPREHENSIVE METABOLIC PANEL
ALBUMIN: 3.5 g/dL (ref 3.5–5.0)
ALK PHOS: 89 U/L (ref 38–126)
ALT: 7 U/L — ABNORMAL LOW (ref 14–54)
AST: 11 U/L — AB (ref 15–41)
Anion gap: 8 (ref 5–15)
BILIRUBIN TOTAL: 0.5 mg/dL (ref 0.3–1.2)
BUN: 10 mg/dL (ref 6–20)
CALCIUM: 9.4 mg/dL (ref 8.9–10.3)
CO2: 24 mmol/L (ref 22–32)
Chloride: 102 mmol/L (ref 101–111)
Creatinine, Ser: 0.46 mg/dL (ref 0.44–1.00)
GFR calc Af Amer: >60 mL/min (ref >60)
GFR calc non Af Amer: >60 mL/min (ref >60)
GLUCOSE: 263 mg/dL — AB (ref 65–99)
Potassium: 4.2 mmol/L (ref 3.5–5.1)
SODIUM: 134 mmol/L — AB (ref 135–145)
TOTAL PROTEIN: 8 g/dL (ref 6.5–8.1)

## 2016-03-09 LAB — CBG MONITORING, ED: Glucose-Capillary: 239 mg/dL — ABNORMAL HIGH (ref 65–99)

## 2016-03-09 LAB — LIPASE, BLOOD: Lipase: 14 U/L (ref 11–51)

## 2016-03-09 LAB — I-STAT BETA HCG BLOOD, ED (MC, WL, AP ONLY): I-stat hCG, quantitative: <5 m[IU]/mL (ref <5)

## 2016-03-09 MED ORDER — ONDANSETRON HCL 4 MG/2ML IJ SOLN
4.0000 mg | Freq: Once | INTRAMUSCULAR | Status: AC
  Administered 2016-03-09: 4 mg via INTRAVENOUS
  Filled 2016-03-09: qty 2

## 2016-03-09 MED ORDER — CEPHALEXIN 500 MG PO CAPS: 500.0000 mg | ORAL_CAPSULE | Freq: Two times a day (BID) | ORAL | 0 refills | Status: DC

## 2016-03-09 MED ORDER — SODIUM CHLORIDE 0.9 % IV BOLUS (SEPSIS)
1000.0000 mL | Freq: Once | INTRAVENOUS | Status: AC
  Administered 2016-03-09: 1000 mL via INTRAVENOUS

## 2016-03-09 MED ORDER — HYDROMORPHONE HCL 1 MG/ML IJ SOLN
0.5000 mg | Freq: Once | INTRAMUSCULAR | Status: AC
  Administered 2016-03-09: 0.5 mg via INTRAVENOUS
  Filled 2016-03-09: qty 1

## 2016-03-09 NOTE — ED Provider Notes (Signed)
[facility]-EMERGENCY DEPT Provider Note   CSN: [id number] Arrival date & time: 03/09/16  0720     History   Chief Complaint Chief Complaint  Patient presents with  . Abdominal Pain    HPI [name] is a 25 y.o. female.  The history is provided by the patient and medical records.   25 year old female with history of asthma, DM 1, hypertension, presenting to the ED for abdominal pain. Patient reports she has had right upper abdominal pain for the past week. States initially she was somewhat constipated and all this was the source of her symptoms. States over the past few days she has had multiple bowel movements without any change in her pain. States pain remains localized to right upper abdomen, worse after eating. States this happens independent of type of food. She denies any nausea or vomiting. No pelvic pain or urinary symptoms. No vaginal discharge.  No flank pain.  No hx of kidney stones.  Patient reports her blood sugar has been better controlled recently. States she has a new primary care doctor and has been keeping tabs on this better. States she tried some penile yesterday, no change in symptoms. No prior abdominal surgeries. Patient reports last time she had symptoms like this she had a UTI.  Past Medical History:  Diagnosis Date  . Asthma   . Diabetes mellitus    type 1   . Essential hypertension, benign 02/10/2016    Patient Active Problem List   Diagnosis Date Noted  . Vitamin D deficiency 02/10/2016  . Essential hypertension, benign 02/10/2016  . Generalized abdominal pain   . Abdominal pain, epigastric 04/10/2013  . Nausea and vomiting 04/10/2013  . Type 1 diabetes mellitus with ketoacidosis without coma (HCC) 11/26/2012  . Menorrhagia with irregular cycle 11/26/2012  . Encounter for Norplant removal 11/26/2012  . Hyperglycemia 11/13/2012  . Type 1 diabetes mellitus (HCC) 11/13/2012  . DKA (diabetic ketoacidoses) (HCC) 04/29/2011  . Noncompliance 04/29/2011      History reviewed. No pertinent surgical history.  OB History    No data available       Home Medications    Prior to Admission medications   Medication Sig Start Date End Date Taking? Authorizing Provider  insulin detemir (LEVEMIR) 100 UNIT/ML injection Inject 0.15 mLs (15 Units total) into the skin 2 (two) times daily. 01/22/16   Freddrick March, MD  insulin glargine (LANTUS) 100 UNIT/ML injection Inject into the skin. 10/22/11   Historical Provider, MD  insulin lispro (HUMALOG) 100 UNIT/ML injection Inject 2-10 Units into the skin 3 (three) times daily with meals. 07/08/15   Historical Provider, MD  lisinopril (PRINIVIL,ZESTRIL) 5 MG tablet Take 1 tablet (5 mg total) by mouth daily. 02/10/16   Anne Ng, NP    Family History Family History  Problem Relation Age of Onset  . Hypertension Paternal Grandmother     Social History Social History  Substance Use Topics  . Smoking status: Current Some Day Smoker  . Smokeless tobacco: Current User  . Alcohol use No     Allergies   Aleve [naproxen sodium]   Review of Systems Review of Systems  Gastrointestinal: Positive for abdominal pain.  All other systems reviewed and are negative.    Physical Exam Updated Vital Signs BP (!) 142/103 (BP Location: Left Arm)   Pulse 96   Temp 98 F (36.7 C) (Oral)   Resp 17   Ht 5\' 4"  (1.626 m)   Wt 64.4 kg   LMP 01/29/2016  SpO2 100%   BMI 24.37 kg/m   Physical Exam  Constitutional: She is oriented to person, place, and time. She appears well-developed and well-nourished.  HENT:  Head: Normocephalic and atraumatic.  Mouth/Throat: Oropharynx is clear and moist.  Eyes: Conjunctivae and EOM are normal. Pupils are equal, round, and reactive to light.  Neck: Normal range of motion.  Cardiovascular: Normal rate, regular rhythm and normal heart sounds.   Pulmonary/Chest: Effort normal and breath sounds normal. No respiratory distress. She has no wheezes.  Abdominal: Soft.  Bowel sounds are normal. There is tenderness in the right upper quadrant. There is no CVA tenderness and negative Murphy's sign.  Abdomen soft, mild tenderness in the right upper quadrant without Murphy's sign, normal bowel sounds No CVA tenderness  Musculoskeletal: Normal range of motion.  Neurological: She is alert and oriented to person, place, and time.  Skin: Skin is warm and dry.  Psychiatric: She has a normal mood and affect.  Nursing note and vitals reviewed.    ED Treatments / Results  Labs (all labs ordered are listed, but only abnormal results are displayed) Labs Reviewed  COMPREHENSIVE METABOLIC PANEL - Abnormal; Notable for the following:       Result Value   Sodium 134 (*)    Glucose, Bld 263 (*)    AST 11 (*)    ALT 7 (*)    All other components within normal limits  URINALYSIS, ROUTINE W REFLEX MICROSCOPIC (NOT AT [facility]) - Abnormal; Notable for the following:    APPearance CLOUDY (*)    Glucose, UA >1000 (*)    Hgb urine dipstick TRACE (*)    Ketones, ur 15 (*)    Protein, ur 30 (*)    All other components within normal limits  BLOOD GAS, VENOUS - Abnormal; Notable for the following:    pCO2, Ven 41.0 (*)    All other components within normal limits  URINE MICROSCOPIC-ADD ON - Abnormal; Notable for the following:    Squamous Epithelial / LPF 6-30 (*)    Bacteria, UA MANY (*)    All other components within normal limits  CBG MONITORING, ED - Abnormal; Notable for the following:    Glucose-Capillary 239 (*)    All other components within normal limits  URINE CULTURE  LIPASE, BLOOD  CBC  I-STAT BETA HCG BLOOD, ED ([facility], [facility], [facility] ONLY)    EKG  EKG Interpretation None       Radiology [place] Abdomen Complete  Result Date: 03/09/2016 CLINICAL DATA:  Right upper quadrant pain EXAM: ABDOMEN ULTRASOUND COMPLETE COMPARISON:  CT scan 10/29/2014 FINDINGS: Gallbladder: No gallstones or wall thickening visualized. No sonographic Murphy sign noted by sonographer. Common  bile duct: Diameter: 3.5 mm in diameter within normal limits Liver: No focal lesion identified. Within normal limits in parenchymal echogenicity. IVC: No abnormality visualized. Pancreas: Visualized portion unremarkable. Spleen: Size and appearance within normal limits. Measures 7.2 cm in length Right Kidney: Length: 12 cm. Echogenicity within normal limits. No mass or hydronephrosis visualized. Left Kidney: Length: 12 cm. Echogenicity within normal limits. No mass or hydronephrosis visualized. Abdominal aorta: No aneurysm visualized. Measures up to 1.9 cm in diameter. Other findings: None. IMPRESSION: Normal abdominal ultrasound. Electronically Signed   By: Natasha MeadLiviu  Pop M.D.   On: 03/09/2016 09:10    Procedures Procedures (including critical care time)  Medications Ordered in ED Medications - No data to display   Initial Impression / Assessment and Plan / ED Course  I have reviewed the  triage vital signs and the nursing notes.  Pertinent labs & imaging results that were available during my care of the patient were reviewed by me and considered in my medical decision making (see chart for details).  Clinical Course    25 year old female here with abdominal pain. States localized to the right upper abdomen for the past week. No associated nausea, vomiting, or diarrhea. She denies any urinary symptoms. She denies history of kidney stones. No flank pain. Reports similar symptoms in the past with UTI. Reports she never has urinary symptoms with these.  She is afebrile and nontoxic. Abdomen is soft with mild tenderness in right upper quadrant. No CVA tenderness. Labwork is overall reassuring. Her glucose is elevated but bicarbonate 9 gap normal. Small ketones in the urine.  Not clinically consistent with DKA.  UA does appear infectious.  Will send for culture.  Abdominal ultrasound negative for acute findings-- specifically, no gallstones or hydronephrosis noted.  Patient's pain was treated here with mild  improvement. She is tolerating oral fluids well. At this time, do not feel she needs further workup as I have low suspicion for acute surgical pathology. Feel she is appropriate for discharge with outpatient treatment for UTI.  Start keflex pending urine culture.  Follow-up with PCP.  Discussed plan with patient, she acknowledged understanding and agreed with plan of care.  Return precautions given for new or worsening symptoms.  Final Clinical Impressions(s) / ED Diagnoses   Final diagnoses:  Abdominal pain, unspecified abdominal location  Acute cystitis with hematuria    New Prescriptions Discharge Medication List as of 03/09/2016 12:35 PM    START taking these medications   Details  cephALEXin (KEFLEX) 500 MG capsule Take 1 capsule (500 mg total) by mouth 2 (two) times daily., Starting Fri 03/09/2016, Print         Garlon HatchetLisa M Darwyn Ponzo, PA-C 03/09/16 1313    Mancel BaleElliott Wentz, MD 03/09/16 1606

## 2016-03-09 NOTE — ED Notes (Signed)
Paatient refused water when brought for fluid challenge. Patient given Sprite as per her request.

## 2016-03-09 NOTE — Discharge Instructions (Signed)
Your tests today did reveal a UTI. Take the prescribed medication as directed. Follow-up with your primary care doctor. Return to the ED for new or worsening symptoms.

## 2016-03-09 NOTE — ED Triage Notes (Signed)
Pt complaint of continued right upper abdominal pain for a week; pt denies GU symptoms but reports constipation. Pt reports hx kidney infection and acute pancreatitis.

## 2016-03-09 NOTE — ED Notes (Signed)
Tolerating sprite. Patient states pain is no better, but drinking Sprite did not make any worse.

## 2016-03-10 LAB — URINE CULTURE

## 2016-04-03 ENCOUNTER — Ambulatory Visit (INDEPENDENT_AMBULATORY_CARE_PROVIDER_SITE_OTHER): Payer: 59 | Admitting: General Practice

## 2016-04-03 DIAGNOSIS — Z23 Encounter for immunization: Secondary | ICD-10-CM

## 2016-04-18 ENCOUNTER — Ambulatory Visit: Payer: 59 | Admitting: *Deleted

## 2016-08-01 ENCOUNTER — Emergency Department (HOSPITAL_COMMUNITY)
Admission: EM | Admit: 2016-08-01 | Discharge: 2016-08-01 | Disposition: A | Discharge status: Home / Self Care | Payer: BLUE CROSS/BLUE SHIELD | Attending: Physician Assistant | Admitting: Physician Assistant

## 2016-08-01 ENCOUNTER — Encounter (HOSPITAL_COMMUNITY): Payer: Self-pay | Admitting: Emergency Medicine

## 2016-08-01 DIAGNOSIS — R197 Diarrhea, unspecified: Secondary | ICD-10-CM | POA: Insufficient documentation

## 2016-08-01 DIAGNOSIS — R112 Nausea with vomiting, unspecified: Secondary | ICD-10-CM | POA: Diagnosis present

## 2016-08-01 DIAGNOSIS — I10 Essential (primary) hypertension: Secondary | ICD-10-CM | POA: Insufficient documentation

## 2016-08-01 DIAGNOSIS — Z79899 Other long term (current) drug therapy: Secondary | ICD-10-CM | POA: Insufficient documentation

## 2016-08-01 DIAGNOSIS — F17210 Nicotine dependence, cigarettes, uncomplicated: Secondary | ICD-10-CM | POA: Insufficient documentation

## 2016-08-01 DIAGNOSIS — E109 Type 1 diabetes mellitus without complications: Secondary | ICD-10-CM | POA: Diagnosis not present

## 2016-08-01 DIAGNOSIS — J45909 Unspecified asthma, uncomplicated: Secondary | ICD-10-CM | POA: Insufficient documentation

## 2016-08-01 LAB — COMPREHENSIVE METABOLIC PANEL
ALK PHOS: 107 U/L (ref 38–126)
ALT: 8 U/L — ABNORMAL LOW (ref 14–54)
ALT: 10 U/L — ABNORMAL LOW (ref 14–54)
ANION GAP: 14 (ref 5–15)
ANION GAP: 14 (ref 5–15)
AST: 17 U/L (ref 15–41)
AST: 18 U/L (ref 15–41)
Albumin: 3 g/dL — ABNORMAL LOW (ref 3.5–5.0)
Albumin: 3.1 g/dL — ABNORMAL LOW (ref 3.5–5.0)
Alkaline Phosphatase: 99 U/L (ref 38–126)
BILIRUBIN TOTAL: 1.4 mg/dL — AB (ref 0.3–1.2)
BUN: 11 mg/dL (ref 6–20)
BUN: 9 mg/dL (ref 6–20)
CALCIUM: 8.5 mg/dL — AB (ref 8.9–10.3)
CHLORIDE: 101 mmol/L (ref 101–111)
CO2: 19 mmol/L — ABNORMAL LOW (ref 22–32)
CO2: 16 mmol/L — AB (ref 22–32)
Calcium: 8.4 mg/dL — ABNORMAL LOW (ref 8.9–10.3)
Chloride: 103 mmol/L (ref 101–111)
Creatinine, Ser: 0.67 mg/dL (ref 0.44–1.00)
Creatinine, Ser: 0.78 mg/dL (ref 0.44–1.00)
GFR calc non Af Amer: >60 mL/min (ref >60)
Glucose, Bld: 294 mg/dL — ABNORMAL HIGH (ref 65–99)
Glucose, Bld: 359 mg/dL — ABNORMAL HIGH (ref 65–99)
POTASSIUM: 3.8 mmol/L (ref 3.5–5.1)
POTASSIUM: 3.8 mmol/L (ref 3.5–5.1)
SODIUM: 133 mmol/L — AB (ref 135–145)
Sodium: 134 mmol/L — ABNORMAL LOW (ref 135–145)
TOTAL PROTEIN: 6.8 g/dL (ref 6.5–8.1)
Total Bilirubin: 1.7 mg/dL — ABNORMAL HIGH (ref 0.3–1.2)
Total Protein: 7.2 g/dL (ref 6.5–8.1)

## 2016-08-01 LAB — URINALYSIS, ROUTINE W REFLEX MICROSCOPIC
Bilirubin Urine: NEGATIVE
Glucose, UA: >=500 mg/dL — AB
Ketones, ur: 20 mg/dL — AB
Nitrite: NEGATIVE
Specific Gravity, Urine: 1.024 (ref 1.005–1.030)
pH: 5 (ref 5.0–8.0)

## 2016-08-01 LAB — BLOOD GAS, VENOUS
ACID-BASE DEFICIT: 7.9 mmol/L — AB (ref 0.0–2.0)
Bicarbonate: 15.4 mmol/L — ABNORMAL LOW (ref 20.0–28.0)
DRAWN BY: 295031
FIO2: 21
O2 SAT: 96 %
PH VEN: 7.384 (ref 7.250–7.430)
Patient temperature: 98.6
pCO2, Ven: 26.4 mmHg — ABNORMAL LOW (ref 44.0–60.0)
pO2, Ven: 83.9 mmHg — ABNORMAL HIGH (ref 32.0–45.0)

## 2016-08-01 LAB — CBC
HCT: 37.5 % (ref 36.0–46.0)
HEMOGLOBIN: 12.7 g/dL (ref 12.0–15.0)
MCH: 28.5 pg (ref 26.0–34.0)
MCHC: 33.9 g/dL (ref 30.0–36.0)
MCV: 84.1 fL (ref 78.0–100.0)
Platelets: 300 10*3/uL (ref 150–400)
RBC: 4.46 MIL/uL (ref 3.87–5.11)
RDW: 13.5 % (ref 11.5–15.5)
WBC: 8.9 10*3/uL (ref 4.0–10.5)

## 2016-08-01 LAB — I-STAT BETA HCG BLOOD, ED (MC, WL, AP ONLY)

## 2016-08-01 LAB — CBG MONITORING, ED
GLUCOSE-CAPILLARY: 217 mg/dL — AB (ref 65–99)
GLUCOSE-CAPILLARY: 379 mg/dL — AB (ref 65–99)
GLUCOSE-CAPILLARY: 353 mg/dL — AB (ref 65–99)
GLUCOSE-CAPILLARY: 297 mg/dL — AB (ref 65–99)

## 2016-08-01 LAB — LIPASE, BLOOD: LIPASE: 13 U/L (ref 11–51)

## 2016-08-01 MED ORDER — SODIUM CHLORIDE 0.9 % IV BOLUS (SEPSIS)
1000.0000 mL | Freq: Once | INTRAVENOUS | Status: AC
  Administered 2016-08-01: 1000 mL via INTRAVENOUS

## 2016-08-01 MED ORDER — ONDANSETRON 4 MG PO TBDP: 4.0000 mg | ORAL_TABLET | Freq: Three times a day (TID) | ORAL | 0 refills | Status: DC | PRN

## 2016-08-01 MED ORDER — ONDANSETRON HCL 4 MG/2ML IJ SOLN
4.0000 mg | Freq: Once | INTRAMUSCULAR | Status: AC
  Administered 2016-08-01: 4 mg via INTRAVENOUS
  Filled 2016-08-01: qty 2

## 2016-08-01 MED ORDER — PROMETHAZINE HCL 25 MG/ML IJ SOLN
12.5000 mg | Freq: Once | INTRAMUSCULAR | Status: AC
  Administered 2016-08-01: 12.5 mg via INTRAVENOUS
  Filled 2016-08-01: qty 1

## 2016-08-01 MED ORDER — INSULIN ASPART PROT & ASPART (70-30 MIX) 100 UNIT/ML ~~LOC~~ SUSP
4.0000 [IU] | Freq: Once | SUBCUTANEOUS | Status: AC
  Administered 2016-08-01: 8 [IU] via SUBCUTANEOUS
  Filled 2016-08-01: qty 10
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
## 2016-08-01 NOTE — ED Notes (Signed)
Pt tolerating oral intake with no n/v.

## 2016-08-01 NOTE — Discharge Instructions (Signed)
Please return if you're unable to keep your sugars controlled. Please drink plenty of fluids and watch your sugars if you are vomiting.

## 2016-08-01 NOTE — ED Notes (Signed)
Pt's CBG=[id number]

## 2016-08-01 NOTE — ED Notes (Signed)
She states she is persistently dizzy, but not excessively so. She states "I still don't feel perfect, but I'm keeping fluids down now."
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
## 2016-08-01 NOTE — ED Notes (Signed)
Bed: WA22 Expected date:  Expected time:  Means of arrival:  Comments: 

## 2016-08-02 LAB — URINE CULTURE

## 2016-08-13 ENCOUNTER — Ambulatory Visit: Payer: 59 | Admitting: Nurse Practitioner

## 2017-05-10 ENCOUNTER — Encounter (HOSPITAL_COMMUNITY): Payer: Self-pay | Admitting: Emergency Medicine

## 2017-05-10 ENCOUNTER — Other Ambulatory Visit: Payer: Self-pay

## 2017-05-10 ENCOUNTER — Ambulatory Visit (HOSPITAL_COMMUNITY)
Admission: EM | Admit: 2017-05-10 | Discharge: 2017-05-10 | Disposition: A | Discharge status: Home / Self Care | Payer: BLUE CROSS/BLUE SHIELD | Attending: Family Medicine | Admitting: Family Medicine

## 2017-05-10 DIAGNOSIS — B9789 Other viral agents as the cause of diseases classified elsewhere: Secondary | ICD-10-CM | POA: Diagnosis not present

## 2017-05-10 DIAGNOSIS — J069 Acute upper respiratory infection, unspecified: Secondary | ICD-10-CM | POA: Diagnosis not present

## 2017-05-10 MED ORDER — PREDNISONE 20 MG PO TABS: ORAL_TABLET | ORAL | 0 refills | Status: DC

## 2017-05-10 MED ORDER — BENZONATATE 100 MG PO CAPS: 100.0000 mg | ORAL_CAPSULE | Freq: Three times a day (TID) | ORAL | 0 refills | Status: DC | PRN

## 2017-05-10 NOTE — ED Provider Notes (Signed)
[facility]   [id number] 05/10/17 Arrival Time: 1627   SUBJECTIVE:  [name] is a 27 y.o. female who presents to the urgent care with complaint of coughing, sore throat. Onset of symptoms was last night.  Feels cool when she takes a deep breath.  She has a history of asthma.  No fever.  She did vomit this morning.  Patient is a Consulting civil engineerstudent in social work at [employer]    Past Medical History:  Diagnosis Date  . Asthma   . Diabetes mellitus    type 1   . Essential hypertension, benign 02/10/2016   Family History  Problem Relation Age of Onset  . Hypertension Paternal Grandmother    Social History   Socioeconomic History  . Marital status: Single    Spouse name: Not on file  . Number of children: Not on file  . Years of education: Not on file  . Highest education level: Not on file  Social Needs  . Financial resource strain: Not on file  . Food insecurity - worry: Not on file  . Food insecurity - inability: Not on file  . Transportation needs - medical: Not on file  . Transportation needs - non-medical: Not on file  Occupational History  . Not on file  Tobacco Use  . Smoking status: Current Some Day Smoker    Types: Cigarettes  . Smokeless tobacco: Current User  Substance and Sexual Activity  . Alcohol use: No  . Drug use: No  . Sexual activity: Yes  Other Topics Concern  . Not on file  Social History Narrative  . Not on file   Current Meds  Medication Sig  . albuterol (PROVENTIL HFA;VENTOLIN HFA) 108 (90 Base) MCG/ACT inhaler Inhale into the lungs every 6 (six) hours as needed for wheezing or shortness of breath.   Allergies  Allergen Reactions  . Aleve [Naproxen Sodium] Swelling      ROS: As per HPI, remainder of ROS negative.   OBJECTIVE:   Vitals:   05/10/17 1708  BP: (!) 153/97  Pulse: (!) 111  Resp: 18  Temp: 98.5 F (36.9 C)  SpO2: 100%     General appearance: alert; no distress Eyes: PERRL; EOMI; conjunctiva  normal HENT: normocephalic; atraumatic; TMs normal, canal normal, external ears normal without trauma; nasal mucosa normal; oral mucosa normal Neck: supple Lungs: clear to auscultation bilaterally Heart: regular rate and rhythm; no murmur Back: no CVA tenderness Extremities: no cyanosis or edema; symmetrical with no gross deformities Skin: warm and dry Neurologic: normal gait; grossly normal Psychological: alert and cooperative; normal mood and affect      Labs:  Results for orders placed or performed during the hospital encounter of 08/01/16  Urine culture  Result Value Ref Range   Specimen Description URINE, CLEAN CATCH    Special Requests NONE    Culture MULTIPLE SPECIES PRESENT, SUGGEST RECOLLECTION (A)    Report Status 08/02/2016 FINAL   CBC  Result Value Ref Range   WBC 8.9 4.0 - 10.5 K/uL   RBC 4.46 3.87 - 5.11 MIL/uL   Hemoglobin 12.7 12.0 - 15.0 g/dL   HCT 08.637.5 57.836.0 - 46.946.0 %   MCV 84.1 78.0 - 100.0 fL   MCH 28.5 26.0 - 34.0 pg   MCHC 33.9 30.0 - 36.0 g/dL   RDW 62.913.5 52.811.5 - 41.315.5 %   Platelets 300 150 - 400 K/uL  Urinalysis, Routine w reflex microscopic  Result Value Ref Range   Color, Urine YELLOW YELLOW  APPearance HAZY (A) CLEAR   Specific Gravity, Urine 1.024 1.005 - 1.030   pH 5.0 5.0 - 8.0   Glucose, UA >=500 (A) NEGATIVE mg/dL   Hgb urine dipstick MODERATE (A) NEGATIVE   Bilirubin Urine NEGATIVE NEGATIVE   Ketones, ur 20 (A) NEGATIVE mg/dL   Protein, ur >=696 (A) NEGATIVE mg/dL   Nitrite NEGATIVE NEGATIVE   Leukocytes, UA TRACE (A) NEGATIVE   RBC / HPF 0-5 0 - 5 RBC/hpf   WBC, UA 6-30 0 - 5 WBC/hpf   Bacteria, UA RARE (A) NONE SEEN   Squamous Epithelial / LPF 6-30 (A) NONE SEEN   Mucus PRESENT    Hyaline Casts, UA PRESENT   Comprehensive metabolic panel  Result Value Ref Range   Sodium 134 (L) 135 - 145 mmol/L   Potassium 3.8 3.5 - 5.1 mmol/L   Chloride 101 101 - 111 mmol/L   CO2 19 (L) 22 - 32 mmol/L   Glucose, Bld 294 (H) 65 - 99 mg/dL    BUN 11 6 - 20 mg/dL   Creatinine, Ser 2.95 0.44 - 1.00 mg/dL   Calcium 8.4 (L) 8.9 - 10.3 mg/dL   Total Protein 6.8 6.5 - 8.1 g/dL   Albumin 3.0 (L) 3.5 - 5.0 g/dL   AST 17 15 - 41 U/L   ALT 8 (L) 14 - 54 U/L   Alkaline Phosphatase 99 38 - 126 U/L   Total Bilirubin 1.4 (H) 0.3 - 1.2 mg/dL   GFR calc non Af Amer >60 >60 mL/min   GFR calc Af Amer >60 >60 mL/min   Anion gap 14 5 - 15  Lipase, blood  Result Value Ref Range   Lipase 13 11 - 51 U/L  Comprehensive metabolic panel  Result Value Ref Range   Sodium 133 (L) 135 - 145 mmol/L   Potassium 3.8 3.5 - 5.1 mmol/L   Chloride 103 101 - 111 mmol/L   CO2 16 (L) 22 - 32 mmol/L   Glucose, Bld 359 (H) 65 - 99 mg/dL   BUN 9 6 - 20 mg/dL   Creatinine, Ser 2.84 0.44 - 1.00 mg/dL   Calcium 8.5 (L) 8.9 - 10.3 mg/dL   Total Protein 7.2 6.5 - 8.1 g/dL   Albumin 3.1 (L) 3.5 - 5.0 g/dL   AST 18 15 - 41 U/L   ALT 10 (L) 14 - 54 U/L   Alkaline Phosphatase 107 38 - 126 U/L   Total Bilirubin 1.7 (H) 0.3 - 1.2 mg/dL   GFR calc non Af Amer >60 >60 mL/min   GFR calc Af Amer >60 >60 mL/min   Anion gap 14 5 - 15  Blood gas, venous  Result Value Ref Range   FIO2 21.00    Delivery systems ROOM AIR    pH, Ven 7.384 7.250 - 7.430   pCO2, Ven 26.4 (L) 44.0 - 60.0 mmHg   pO2, Ven 83.9 (H) 32.0 - 45.0 mmHg   Bicarbonate 15.4 (L) 20.0 - 28.0 mmol/L   Acid-base deficit 7.9 (H) 0.0 - 2.0 mmol/L   O2 Saturation 96.0 %   Patient temperature 98.6    Collection site VEIN    Drawn by [phone]    Sample type VEIN   I-Stat beta hCG blood, ED  Result Value Ref Range   I-stat hCG, quantitative <5.0 <5 mIU/mL   Comment 3          CBG monitoring, ED  Result Value Ref Range   Glucose-Capillary 217 (H) 65 -  99 mg/dL  CBG monitoring, ED  Result Value Ref Range   Glucose-Capillary 379 (H) 65 - 99 mg/dL  POC CBG, ED  Result Value Ref Range   Glucose-Capillary 353 (H) 65 - 99 mg/dL  CBG monitoring, ED  Result Value Ref Range   Glucose-Capillary 297 (H) 65 -  99 mg/dL   Comment 1 Notify RN    Comment 2 Document in Chart     Labs Reviewed - No data to display  No results found.     ASSESSMENT & PLAN:  1. Viral URI with cough     Meds ordered this encounter  Medications  . benzonatate (TESSALON) 100 MG capsule    Sig: Take 1-2 capsules (100-200 mg total) by mouth 3 (three) times daily as needed for cough.    Dispense:  40 capsule    Refill:  0  . predniSONE (DELTASONE) 20 MG tablet    Sig: Two daily with food    Dispense:  6 tablet    Refill:  0    Reviewed expectations re: course of current medical issues. Questions answered. Outlined signs and symptoms indicating need for more acute intervention. Patient verbalized understanding. After Visit Summary given.    Procedures:Get as much rest as you can  Continue to drink plenty of fluids  Return if you start running a fever.      Elvina Sidle, MD 05/10/17 1744

## 2017-05-10 NOTE — ED Triage Notes (Signed)
Pt states 5pm last night, c/o chest pains with coughing, sore throat.

## 2017-05-10 NOTE — Discharge Instructions (Signed)
Get as much rest as you can  Continue to drink plenty of fluids  Return if you start running a fever.

## 2017-06-04 ENCOUNTER — Emergency Department (HOSPITAL_COMMUNITY)
Admission: EM | Admit: 2017-06-04 | Discharge: 2017-06-04 | Discharge status: Against Medical Advice | Payer: BLUE CROSS/BLUE SHIELD | Attending: Emergency Medicine | Admitting: Emergency Medicine

## 2017-06-04 ENCOUNTER — Encounter (HOSPITAL_COMMUNITY): Payer: Self-pay | Admitting: Emergency Medicine

## 2017-06-04 DIAGNOSIS — Z5321 Procedure and treatment not carried out due to patient leaving prior to being seen by health care provider: Secondary | ICD-10-CM | POA: Insufficient documentation

## 2017-06-04 DIAGNOSIS — R109 Unspecified abdominal pain: Secondary | ICD-10-CM | POA: Diagnosis present

## 2017-06-04 LAB — CBG MONITORING, ED: GLUCOSE-CAPILLARY: 237 mg/dL — AB (ref 65–99)

## 2017-06-04 NOTE — ED Notes (Signed)
Called for room placement with no answer. 

## 2017-06-04 NOTE — ED Triage Notes (Signed)
Patient c/o lower abdominal pain with N/V since Sunday. Denies diarrhea.

## 2017-06-04 NOTE — ED Notes (Signed)
Pt was stuck twice for labs. First lab draw vein rolled, second attempt blood would not flow. Pt stated that its hard to draw blood from her.  No labs were drawn/sent at this time.

## 2017-06-05 ENCOUNTER — Ambulatory Visit (HOSPITAL_COMMUNITY)
Admission: EM | Admit: 2017-06-05 | Discharge: 2017-06-05 | Disposition: A | Discharge status: Home / Self Care | Payer: BLUE CROSS/BLUE SHIELD | Attending: Family Medicine | Admitting: Family Medicine

## 2017-06-05 ENCOUNTER — Ambulatory Visit (INDEPENDENT_AMBULATORY_CARE_PROVIDER_SITE_OTHER): Payer: BLUE CROSS/BLUE SHIELD

## 2017-06-05 ENCOUNTER — Encounter (HOSPITAL_COMMUNITY): Payer: Self-pay | Admitting: Family Medicine

## 2017-06-05 DIAGNOSIS — R350 Frequency of micturition: Secondary | ICD-10-CM

## 2017-06-05 DIAGNOSIS — E1065 Type 1 diabetes mellitus with hyperglycemia: Secondary | ICD-10-CM

## 2017-06-05 DIAGNOSIS — R1110 Vomiting, unspecified: Secondary | ICD-10-CM | POA: Diagnosis not present

## 2017-06-05 DIAGNOSIS — R1084 Generalized abdominal pain: Secondary | ICD-10-CM

## 2017-06-05 DIAGNOSIS — Z3202 Encounter for pregnancy test, result negative: Secondary | ICD-10-CM | POA: Diagnosis not present

## 2017-06-05 DIAGNOSIS — R112 Nausea with vomiting, unspecified: Secondary | ICD-10-CM

## 2017-06-05 DIAGNOSIS — E1165 Type 2 diabetes mellitus with hyperglycemia: Secondary | ICD-10-CM

## 2017-06-05 DIAGNOSIS — K5900 Constipation, unspecified: Secondary | ICD-10-CM

## 2017-06-05 DIAGNOSIS — R1032 Left lower quadrant pain: Secondary | ICD-10-CM

## 2017-06-05 DIAGNOSIS — R109 Unspecified abdominal pain: Secondary | ICD-10-CM | POA: Diagnosis not present

## 2017-06-05 DIAGNOSIS — R110 Nausea: Secondary | ICD-10-CM

## 2017-06-05 DIAGNOSIS — E109 Type 1 diabetes mellitus without complications: Secondary | ICD-10-CM | POA: Diagnosis not present

## 2017-06-05 LAB — POCT URINALYSIS DIP (DEVICE)
Bilirubin Urine: NEGATIVE
Glucose, UA: 500 mg/dL — AB
KETONES UR: 15 mg/dL — AB
Leukocytes, UA: NEGATIVE
Nitrite: NEGATIVE
PROTEIN: 30 mg/dL — AB
SPECIFIC GRAVITY, URINE: 1.01 (ref 1.005–1.030)
UROBILINOGEN UA: 0.2 mg/dL (ref 0.0–1.0)
pH: 5.5 (ref 5.0–8.0)

## 2017-06-05 LAB — POCT PREGNANCY, URINE: Preg Test, Ur: NEGATIVE

## 2017-06-05 LAB — POCT I-STAT, CHEM 8
BUN: 8 mg/dL (ref 6–20)
CALCIUM ION: 1.22 mmol/L (ref 1.15–1.40)
CHLORIDE: 100 mmol/L — AB (ref 101–111)
CREATININE: 0.5 mg/dL (ref 0.44–1.00)
Glucose, Bld: 423 mg/dL — ABNORMAL HIGH (ref 65–99)
HCT: 38 % (ref 36.0–46.0)
Hemoglobin: 12.9 g/dL (ref 12.0–15.0)
Potassium: 4 mmol/L (ref 3.5–5.1)
Sodium: 136 mmol/L (ref 135–145)
TCO2: 23 mmol/L (ref 22–32)

## 2017-06-05 LAB — GLUCOSE, CAPILLARY: Glucose-Capillary: 403 mg/dL — ABNORMAL HIGH (ref 65–99)

## 2017-06-05 MED ORDER — ONDANSETRON 4 MG PO TBDP: 4.0000 mg | ORAL_TABLET | Freq: Three times a day (TID) | ORAL | 0 refills | Status: DC | PRN

## 2017-06-05 NOTE — ED Triage Notes (Signed)
Pt here for generalized abd pain since Sunday. Reports sometimes the pain is in the LLQ and flank area. sts associated with vomiting. She is a type 1 diabetic and her sugars have been elevated. sts some constipation and small stools. Took mira lax last night. Sugar 400 in triage. Urinating more frequently.

## 2017-06-05 NOTE — Discharge Instructions (Signed)
Please return here or to the Emergency Department immediately should you feel worse in any way or have any of the following symptoms: increasing or different abdominal pain, persistent vomiting, fevers, or shaking chills. Please follow for a recheck in 24 hours in order to ensure that you are not developing a problem that might require surgery or hospitalization.  Please do your best to ensure adequate fluid intake in order to avoid dehydration. If you find that you are unable to tolerate drinking fluids regularly please proceed to the Emergency Department for evaluation.  You may use over the counter MIRALAX for the next 2-3 days. This should help with your constipation.

## 2017-06-10 NOTE — ED Provider Notes (Signed)
[facility]   [id number] 06/05/17 Arrival Time: 1023  ASSESSMENT & PLAN:  1. Generalized abdominal pain   2. Nausea without vomiting   3. Constipation, unspecified constipation type   4. Uncontrolled type 2 diabetes mellitus with hyperglycemia (HCC)     Meds ordered this encounter  Medications  . ondansetron (ZOFRAN-ODT) 4 MG disintegrating tablet    Sig: Take 1 tablet (4 mg total) by mouth every 8 (eight) hours as needed for nausea or vomiting.    Dispense:  15 tablet    Refill:  0   Ensure adequate fluid intake. She is getting her DM meds filled by her PCP. To watch blood sugars closely. Voices understanding. Continue Miralax for the next 48 hours. May try Magnesium Citrate as well. Abdominal pain precautions given; see AVS. May f/u here if needed.  Reviewed expectations re: course of current medical issues. Questions answered. Outlined signs and symptoms indicating need for more acute intervention. Patient verbalized understanding. After Visit Summary given.   SUBJECTIVE:  [name] is a 27 y.o. female who presents with complaint of intermittent abdominal discomfort. Onset gradual, several days ago. Location: generalized without radiation. Sometimes more in LLQ and L flank. Described as colicky and pressure-like. Symptoms are stable since beginning. Aggravating factors: none. Alleviating factors: some relief after small than normal bowel movements. Associated symptoms: sporadic nausea with occasional vomiting. "Feels like I'm constipated." She denies anorexia, belching, chills, diarrhea, dysuria, fever, myalgias and sweats. Appetite: normal. PO intake: normal. Ambulatory without assistance. Urinary symptoms: questions if more urinary frequency; "happens when my blood sugars are up, which they have been". Last bowel movement yesterday without blood. OTC treatment: Miralax; one dose; bowel movements unchanged.  Patient's last menstrual period was 05/07/2017.    History reviewed. No pertinent surgical history.  ROS: As per HPI.  OBJECTIVE:  Vitals:   06/05/17 1102  BP: 136/85  Pulse: (!) 109  Resp: 18  Temp: 97.9 F (36.6 C)  TempSrc: Oral  SpO2: 98%    General appearance: alert; no distress Lungs: clear to auscultation bilaterally Heart: regular rate and rhythm Abdomen: soft; non-distended; mild tenderness that is generalized, "just a little sore when you push"; bowel sounds present; no masses or organomegaly; no guarding or rebound tenderness Back: no CVA tenderness; FROM at hips Extremities: no edema; symmetrical with no gross deformities Skin: warm and dry Neurologic: normal gait Psychological: alert and cooperative; normal mood and affect  Labs: Results for orders placed or performed during the hospital encounter of 06/05/17  Glucose, capillary  Result Value Ref Range   Glucose-Capillary 403 (H) 65 - 99 mg/dL  POCT urinalysis dip (device)  Result Value Ref Range   Glucose, UA 500 (A) NEGATIVE mg/dL   Bilirubin Urine NEGATIVE NEGATIVE   Ketones, ur 15 (A) NEGATIVE mg/dL   Specific Gravity, Urine 1.010 1.005 - 1.030   Hgb urine dipstick TRACE (A) NEGATIVE   pH 5.5 5.0 - 8.0   Protein, ur 30 (A) NEGATIVE mg/dL   Urobilinogen, UA 0.2 0.0 - 1.0 mg/dL   Nitrite NEGATIVE NEGATIVE   Leukocytes, UA NEGATIVE NEGATIVE  Pregnancy, urine POC  Result Value Ref Range   Preg Test, Ur NEGATIVE NEGATIVE  I-STAT, chem 8  Result Value Ref Range   Sodium 136 135 - 145 mmol/L   Potassium 4.0 3.5 - 5.1 mmol/L   Chloride 100 (L) 101 - 111 mmol/L   BUN 8 6 - 20 mg/dL   Creatinine, Ser 4.09 0.44 - 1.00 mg/dL  Glucose, Bld 423 (H) 65 - 99 mg/dL   Calcium, Ion 1.611.22 0.961.15 - 1.40 mmol/L   TCO2 23 22 - 32 mmol/L   Hemoglobin 12.9 12.0 - 15.0 g/dL   HCT 04.538.0 40.936.0 - 81.146.0 %   Labs Reviewed  GLUCOSE, CAPILLARY - Abnormal; Notable for the following components:      Result Value   Glucose-Capillary 403 (*)    All other components within  normal limits  POCT URINALYSIS DIP (DEVICE) - Abnormal; Notable for the following components:   Glucose, UA 500 (*)    Ketones, ur 15 (*)    Hgb urine dipstick TRACE (*)    Protein, ur 30 (*)    All other components within normal limits  POCT I-STAT, CHEM 8 - Abnormal; Notable for the following components:   Chloride 100 (*)    Glucose, Bld 423 (*)    All other components within normal limits  POCT PREGNANCY, URINE    Imaging: Dg Abd 2 Views  Result Date: 06/05/2017 CLINICAL DATA:  Abdominal pain. Nausea and vomiting. Diabetes. History pancreatitis. EXAM: ABDOMEN - 2 VIEW COMPARISON:  10/29/2014 CT FINDINGS: Upright and supine views. The upright view demonstrates no free intraperitoneal air or significant air-fluid levels. The supine view demonstrates no gaseous distention of bowel loops. Distal gas and stool. IMPRESSION: No acute findings. Electronically Signed   By: Jeronimo GreavesKyle  Talbot M.D.   On: 06/05/2017 12:11    Allergies  Allergen Reactions  . Aleve [Naproxen Sodium] Swelling                                               Past Medical History:  Diagnosis Date  . Asthma   . Diabetes mellitus    type 1   . Essential hypertension, benign 02/10/2016   Social History   Socioeconomic History  . Marital status: Single    Spouse name: Not on file  . Number of children: Not on file  . Years of education: Not on file  . Highest education level: Not on file  Social Needs  . Financial resource strain: Not on file  . Food insecurity - worry: Not on file  . Food insecurity - inability: Not on file  . Transportation needs - medical: Not on file  . Transportation needs - non-medical: Not on file  Occupational History  . Not on file  Tobacco Use  . Smoking status: Current Some Day Smoker    Types: Cigarettes  . Smokeless tobacco: Current User  Substance and Sexual Activity  . Alcohol use: No  . Drug use: No  . Sexual activity: Yes  Other Topics Concern  . Not on file  Social  History Narrative  . Not on file   Family History  Problem Relation Age of Onset  . Hypertension Paternal Dulce SellarGrandmother      Ariston Grandison, MD 06/10/17 [phone]

## 2017-10-25 LAB — GLUCOSE, POCT (MANUAL RESULT ENTRY): POC Glucose: 503 mg/dl — AB (ref 70–99)

## 2018-06-01 ENCOUNTER — Other Ambulatory Visit: Payer: Self-pay

## 2018-06-01 ENCOUNTER — Encounter (HOSPITAL_COMMUNITY): Payer: Self-pay

## 2018-06-01 ENCOUNTER — Ambulatory Visit (HOSPITAL_COMMUNITY)
Admission: EM | Admit: 2018-06-01 | Discharge: 2018-06-01 | Disposition: A | Discharge status: Home / Self Care | Payer: BLUE CROSS/BLUE SHIELD | Attending: Internal Medicine | Admitting: Internal Medicine

## 2018-06-01 DIAGNOSIS — K115 Sialolithiasis: Secondary | ICD-10-CM

## 2018-06-01 MED ORDER — FLUCONAZOLE 150 MG PO TABS: 150.0000 mg | ORAL_TABLET | Freq: Every day | ORAL | 0 refills | Status: AC

## 2018-06-01 MED ORDER — CEPHALEXIN 500 MG PO CAPS: 500.0000 mg | ORAL_CAPSULE | Freq: Four times a day (QID) | ORAL | 0 refills | Status: AC

## 2018-06-01 NOTE — ED Triage Notes (Addendum)
Pt states she has right side swelling in her face. Pt states her face is tender and painful on that side of her face.

## 2019-01-21 NOTE — ED Provider Notes (Signed)
[facility]    CSN: [id number] Arrival date & time: 06/01/18  1135      History   Chief Complaint Chief Complaint  Patient presents with  . Facial Swelling    HPI [name] is a 28 y.o. female.   Pt with PMH of DM1 c/o facial swelling that she states began today. She denies fever but admits to mild pain.      Past Medical History:  Diagnosis Date  . Asthma   . Diabetes mellitus    type 1   . Essential hypertension, benign 02/10/2016    Patient Active Problem List   Diagnosis Date Noted  . Vitamin D deficiency 02/10/2016  . Essential hypertension, benign 02/10/2016  . Generalized abdominal pain   . Abdominal pain, epigastric 04/10/2013  . Nausea and vomiting 04/10/2013  . Type 1 diabetes mellitus with ketoacidosis without coma (HCC) 11/26/2012  . Menorrhagia with irregular cycle 11/26/2012  . Encounter for Norplant removal 11/26/2012  . Hyperglycemia 11/13/2012  . Type 1 diabetes mellitus (HCC) 11/13/2012  . DKA (diabetic ketoacidoses) (HCC) 04/29/2011  . Noncompliance 04/29/2011    History reviewed. No pertinent surgical history.  OB History   No obstetric history on file.      Home Medications    Prior to Admission medications   Medication Sig Start Date End Date Taking? Authorizing Provider  albuterol (PROVENTIL HFA;VENTOLIN HFA) 108 (90 Base) MCG/ACT inhaler Inhale into the lungs every 6 (six) hours as needed for wheezing or shortness of breath.    [provider]  benzonatate (TESSALON) 100 MG capsule Take 1-2 capsules (100-200 mg total) by mouth 3 (three) times daily as needed for cough. 05/10/17   Elvina SidleLauenstein, Kurt, MD  insulin glargine (LANTUS) 100 UNIT/ML injection Inject 20 Units into the skin 2 (two) times daily.    [provider]  NOVOLOG FLEXPEN 100 UNIT/ML FlexPen Inject 1-9 Units into the skin 3 (three) times daily with meals.  07/22/16   [provider]  ondansetron (ZOFRAN-ODT) 4 MG disintegrating  tablet Take 1 tablet (4 mg total) by mouth every 8 (eight) hours as needed for nausea or vomiting. 06/05/17   Mardella LaymanHagler, Brian, MD  predniSONE (DELTASONE) 20 MG tablet Two daily with food 05/10/17   Elvina SidleLauenstein, Kurt, MD  ranitidine (ZANTAC) 75 MG tablet Take 75 mg by mouth daily as needed for heartburn.    [provider]    Family History Family History  Problem Relation Age of Onset  . Hypertension Paternal Grandmother     Social History Social History   Tobacco Use  . Smoking status: Current Some Day Smoker    Types: Cigarettes  . Smokeless tobacco: Current User  Substance Use Topics  . Alcohol use: No  . Drug use: No     Allergies   Aleve [naproxen sodium]   Review of Systems Review of Systems  Constitutional: Negative for chills and fever.  HENT: Negative for sore throat and tinnitus.   Eyes: Negative for redness.  Respiratory: Negative for cough and shortness of breath.   Cardiovascular: Negative for chest pain and palpitations.  Gastrointestinal: Negative for abdominal pain, diarrhea, nausea and vomiting.  Genitourinary: Negative for dysuria, frequency and urgency.  Musculoskeletal: Negative for myalgias.  Skin: Negative for rash.       No lesions  Neurological: Negative for weakness.  Hematological: Does not bruise/bleed easily.  Psychiatric/Behavioral: Negative for suicidal ideas.     Physical Exam Triage Vital Signs ED Triage Vitals  Enc  Vitals Group     BP 06/01/18 1328 (!) 160/91     Pulse Rate 06/01/18 1328 80     Resp 06/01/18 1328 18     Temp 06/01/18 1328 97.8 F (36.6 C)     Temp Source 06/01/18 1328 Oral     SpO2 06/01/18 1328 100 %     Weight 06/01/18 1326 152 lb (68.9 kg)     Height --      Head Circumference --      Peak Flow --      Pain Score 06/01/18 1329 9     Pain Loc --      Pain Edu? --      Excl. in GC? --    No data found.  Updated Vital Signs BP (!) 160/91 (BP Location: Right Arm)   Pulse 80   Temp 97.8 F (36.6  C) (Oral)   Resp 18   Wt 68.9 kg   LMP 05/30/2018   SpO2 100%   BMI 25.29 kg/m   Visual Acuity Right Eye Distance:   Left Eye Distance:   Bilateral Distance:    Right Eye Near:   Left Eye Near:    Bilateral Near:     Physical Exam Vitals signs and nursing note reviewed.  Constitutional:      General: She is not in acute distress.    Appearance: She is well-developed.  HENT:     Head: Normocephalic and atraumatic.  Eyes:     General: No scleral icterus.    Conjunctiva/sclera: Conjunctivae normal.     Pupils: Pupils are equal, round, and reactive to light.  Neck:     Musculoskeletal: Normal range of motion and neck supple.     Thyroid: No thyromegaly.     Vascular: No JVD.     Trachea: No tracheal deviation.  Cardiovascular:     Rate and Rhythm: Normal rate and regular rhythm.     Heart sounds: Normal heart sounds. No murmur. No friction rub. No gallop.   Pulmonary:     Effort: Pulmonary effort is normal.     Breath sounds: Normal breath sounds.  Abdominal:     General: Bowel sounds are normal. There is no distension.     Palpations: Abdomen is soft.     Tenderness: There is no abdominal tenderness.  Musculoskeletal: Normal range of motion.  Lymphadenopathy:     Cervical: No cervical adenopathy.  Skin:    General: Skin is warm and dry.  Neurological:     Mental Status: She is alert and oriented to person, place, and time.     Cranial Nerves: No cranial nerve deficit.  Psychiatric:        Behavior: Behavior normal.        Thought Content: Thought content normal.        Judgment: Judgment normal.      UC Treatments / Results  Labs (all labs ordered are listed, but only abnormal results are displayed) Labs Reviewed - No data to display  EKG   Radiology No results found.  Procedures Procedures (including critical care time)  Medications Ordered in UC Medications - No data to display  Initial Impression / Assessment and Plan / UC Course  I have  reviewed the triage vital signs and the nursing notes.  Pertinent labs & imaging results that were available during my care of the patient were reviewed by me and considered in my medical decision making (see chart for details).  Glandular swelling under tongue indicates blocked salivary glad.  Rec eating sour food to help duct express blockage Final Clinical Impressions(s) / UC Diagnoses   Final diagnoses:  Sialolithiasis   Discharge Instructions   None    ED Prescriptions    Medication Sig Dispense Auth. Provider   cephALEXin (KEFLEX) 500 MG capsule Take 1 capsule (500 mg total) by mouth 4 (four) times daily for 7 days. 28 capsule Harrie Foreman, MD   fluconazole (DIFLUCAN) 150 MG tablet Take 1 tablet (150 mg total) by mouth daily for 1 dose. Take at the end of your course of antibiotics 1 tablet Harrie Foreman, MD     PDMP not reviewed this encounter.   Harrie Foreman, MD 01/21/19 [phone]

## 2019-02-17 ENCOUNTER — Other Ambulatory Visit: Payer: Self-pay

## 2019-02-17 DIAGNOSIS — Z20822 Contact with and (suspected) exposure to covid-19: Secondary | ICD-10-CM

## 2019-02-18 DIAGNOSIS — E0590 Thyrotoxicosis, unspecified without thyrotoxic crisis or storm: Secondary | ICD-10-CM | POA: Diagnosis not present

## 2019-02-18 DIAGNOSIS — Z79899 Other long term (current) drug therapy: Secondary | ICD-10-CM | POA: Diagnosis not present

## 2019-02-18 DIAGNOSIS — F17200 Nicotine dependence, unspecified, uncomplicated: Secondary | ICD-10-CM | POA: Diagnosis not present

## 2019-02-18 DIAGNOSIS — E1065 Type 1 diabetes mellitus with hyperglycemia: Secondary | ICD-10-CM | POA: Diagnosis not present

## 2019-02-18 DIAGNOSIS — N3000 Acute cystitis without hematuria: Secondary | ICD-10-CM | POA: Diagnosis not present

## 2019-02-18 DIAGNOSIS — R110 Nausea: Secondary | ICD-10-CM | POA: Diagnosis not present

## 2019-02-18 DIAGNOSIS — Z794 Long term (current) use of insulin: Secondary | ICD-10-CM | POA: Diagnosis not present

## 2019-02-18 DIAGNOSIS — R1084 Generalized abdominal pain: Secondary | ICD-10-CM | POA: Diagnosis not present

## 2019-02-18 LAB — NOVEL CORONAVIRUS, NAA: SARS-CoV-2, NAA: NOT DETECTED

## 2019-02-19 DIAGNOSIS — F17200 Nicotine dependence, unspecified, uncomplicated: Secondary | ICD-10-CM | POA: Diagnosis not present

## 2019-02-19 DIAGNOSIS — R1033 Periumbilical pain: Secondary | ICD-10-CM | POA: Diagnosis not present

## 2019-02-19 DIAGNOSIS — Z6822 Body mass index (BMI) 22.0-22.9, adult: Secondary | ICD-10-CM | POA: Diagnosis not present

## 2019-02-19 DIAGNOSIS — R112 Nausea with vomiting, unspecified: Secondary | ICD-10-CM | POA: Diagnosis not present

## 2019-02-19 DIAGNOSIS — K5900 Constipation, unspecified: Secondary | ICD-10-CM | POA: Diagnosis not present

## 2019-02-19 DIAGNOSIS — Z79899 Other long term (current) drug therapy: Secondary | ICD-10-CM | POA: Diagnosis not present

## 2019-02-19 DIAGNOSIS — R531 Weakness: Secondary | ICD-10-CM | POA: Diagnosis not present

## 2019-02-19 DIAGNOSIS — E1065 Type 1 diabetes mellitus with hyperglycemia: Secondary | ICD-10-CM | POA: Diagnosis not present

## 2019-02-19 DIAGNOSIS — N390 Urinary tract infection, site not specified: Secondary | ICD-10-CM | POA: Diagnosis not present

## 2019-02-19 DIAGNOSIS — E0590 Thyrotoxicosis, unspecified without thyrotoxic crisis or storm: Secondary | ICD-10-CM | POA: Diagnosis not present

## 2019-02-19 DIAGNOSIS — R1031 Right lower quadrant pain: Secondary | ICD-10-CM | POA: Diagnosis not present

## 2019-03-24 DIAGNOSIS — E1065 Type 1 diabetes mellitus with hyperglycemia: Secondary | ICD-10-CM | POA: Diagnosis not present

## 2019-08-18 DIAGNOSIS — E1065 Type 1 diabetes mellitus with hyperglycemia: Secondary | ICD-10-CM | POA: Diagnosis not present

## 2019-08-18 DIAGNOSIS — Z6822 Body mass index (BMI) 22.0-22.9, adult: Secondary | ICD-10-CM | POA: Diagnosis not present

## 2019-09-05 DIAGNOSIS — R109 Unspecified abdominal pain: Secondary | ICD-10-CM | POA: Diagnosis not present

## 2019-09-05 DIAGNOSIS — Z79899 Other long term (current) drug therapy: Secondary | ICD-10-CM | POA: Diagnosis not present

## 2019-09-05 DIAGNOSIS — R1110 Vomiting, unspecified: Secondary | ICD-10-CM | POA: Diagnosis not present

## 2019-09-05 DIAGNOSIS — K5900 Constipation, unspecified: Secondary | ICD-10-CM | POA: Diagnosis not present

## 2019-09-05 DIAGNOSIS — R011 Cardiac murmur, unspecified: Secondary | ICD-10-CM | POA: Diagnosis not present

## 2019-09-05 DIAGNOSIS — E872 Acidosis: Secondary | ICD-10-CM | POA: Diagnosis not present

## 2019-09-05 DIAGNOSIS — E1065 Type 1 diabetes mellitus with hyperglycemia: Secondary | ICD-10-CM | POA: Diagnosis not present

## 2019-09-05 DIAGNOSIS — R079 Chest pain, unspecified: Secondary | ICD-10-CM | POA: Diagnosis not present

## 2019-09-05 DIAGNOSIS — Z888 Allergy status to other drugs, medicaments and biological substances status: Secondary | ICD-10-CM | POA: Diagnosis not present

## 2019-09-05 DIAGNOSIS — Z599 Problem related to housing and economic circumstances, unspecified: Secondary | ICD-10-CM | POA: Diagnosis not present

## 2019-09-05 DIAGNOSIS — E0590 Thyrotoxicosis, unspecified without thyrotoxic crisis or storm: Secondary | ICD-10-CM | POA: Diagnosis not present

## 2019-09-05 DIAGNOSIS — E109 Type 1 diabetes mellitus without complications: Secondary | ICD-10-CM | POA: Diagnosis not present

## 2019-09-05 DIAGNOSIS — R112 Nausea with vomiting, unspecified: Secondary | ICD-10-CM | POA: Diagnosis not present

## 2019-09-05 DIAGNOSIS — R1013 Epigastric pain: Secondary | ICD-10-CM | POA: Diagnosis not present

## 2019-09-05 DIAGNOSIS — I10 Essential (primary) hypertension: Secondary | ICD-10-CM | POA: Diagnosis not present

## 2019-09-05 DIAGNOSIS — Z87891 Personal history of nicotine dependence: Secondary | ICD-10-CM | POA: Diagnosis not present

## 2019-09-06 DIAGNOSIS — R079 Chest pain, unspecified: Secondary | ICD-10-CM | POA: Diagnosis not present

## 2019-09-06 DIAGNOSIS — I10 Essential (primary) hypertension: Secondary | ICD-10-CM | POA: Diagnosis not present

## 2019-09-06 DIAGNOSIS — E1065 Type 1 diabetes mellitus with hyperglycemia: Secondary | ICD-10-CM | POA: Diagnosis not present

## 2019-09-06 DIAGNOSIS — K5900 Constipation, unspecified: Secondary | ICD-10-CM | POA: Insufficient documentation

## 2019-09-06 DIAGNOSIS — R109 Unspecified abdominal pain: Secondary | ICD-10-CM | POA: Diagnosis not present

## 2019-09-06 DIAGNOSIS — R112 Nausea with vomiting, unspecified: Secondary | ICD-10-CM | POA: Diagnosis not present

## 2019-09-07 DIAGNOSIS — Z6822 Body mass index (BMI) 22.0-22.9, adult: Secondary | ICD-10-CM | POA: Diagnosis not present

## 2019-09-07 DIAGNOSIS — R112 Nausea with vomiting, unspecified: Secondary | ICD-10-CM | POA: Diagnosis not present

## 2019-09-21 ENCOUNTER — Other Ambulatory Visit: Payer: Self-pay

## 2019-09-22 ENCOUNTER — Encounter: Payer: Self-pay | Admitting: Nurse Practitioner

## 2019-09-22 ENCOUNTER — Telehealth (INDEPENDENT_AMBULATORY_CARE_PROVIDER_SITE_OTHER): Payer: BC Managed Care – PPO | Admitting: Nurse Practitioner

## 2019-09-22 DIAGNOSIS — R87612 Low grade squamous intraepithelial lesion on cytologic smear of cervix (LGSIL): Secondary | ICD-10-CM | POA: Insufficient documentation

## 2019-09-22 DIAGNOSIS — J209 Acute bronchitis, unspecified: Secondary | ICD-10-CM | POA: Diagnosis not present

## 2019-09-22 DIAGNOSIS — J00 Acute nasopharyngitis [common cold]: Secondary | ICD-10-CM | POA: Diagnosis not present

## 2019-09-22 DIAGNOSIS — K5900 Constipation, unspecified: Secondary | ICD-10-CM | POA: Diagnosis not present

## 2019-09-22 MED ORDER — FLUTICASONE PROPIONATE 50 MCG/ACT NA SUSP: 2.0000 | Freq: Every day | NASAL | 0 refills | Status: DC

## 2019-09-22 MED ORDER — CETIRIZINE HCL 10 MG PO TABS: 10.0000 mg | ORAL_TABLET | Freq: Every day | ORAL | 0 refills | Status: DC

## 2019-09-22 MED ORDER — POLYETHYLENE GLYCOL 3350 17 GM/SCOOP PO POWD: 17.0000 g | Freq: Every day | ORAL | 1 refills | Status: DC

## 2019-09-22 MED ORDER — BENZONATATE 100 MG PO CAPS: 100.0000 mg | ORAL_CAPSULE | Freq: Three times a day (TID) | ORAL | 0 refills | Status: DC | PRN

## 2019-09-22 MED ORDER — PROAIR RESPICLICK 108 (90 BASE) MCG/ACT IN AEPB: 1.0000 | INHALATION_SPRAY | Freq: Four times a day (QID) | RESPIRATORY_TRACT | 0 refills | Status: AC | PRN

## 2019-09-22 MED ORDER — GUAIFENESIN-DM 100-10 MG/5ML PO SYRP: 15.0000 mL | ORAL_SOLUTION | Freq: Three times a day (TID) | ORAL | 0 refills | Status: DC | PRN

## 2019-09-22 NOTE — Progress Notes (Signed)
Virtual Visit via Video Note  I connected with@ on 09/22/19 at 11:00 AM EDT by a video enabled telemedicine application and verified that I am speaking with the correct person using two identifiers.  Location: Patient:Home Provider: Office Participants: patient and provider   I connected with "Patient Name" today by a video enabled telemedicine application and verified that I am speaking with the correct person using two identifiers. Location patient: home Location provider: work Persons participating in the virtual visit: patient, provider  I discussed the limitations of evaluation and management by telemedicine and the availability of in person appointments. I also discussed with the patient that there may be a patient responsible charge related to this service. The patient expressed understanding and agreed to proceed.  FG:[id number] x 27month and URI x 3days Pt had been to ER for vomiting.  Pt has not had a BM, causing lt side pain.  Pt now has a cough  History of Present Illness: Constipation This is a new problem. The current episode started 1 to 4 weeks ago. The problem has been gradually improving since onset. Her stool frequency is 2 to 3 times per week. The stool is described as formed. The patient is not on a high fiber diet. She does not exercise regularly. There has been adequate water intake. Associated symptoms include abdominal pain. Pertinent negatives include no anorexia, diarrhea, difficulty urinating, fecal incontinence, flatus, hematochezia, hemorrhoids, melena, nausea, rectal pain, vomiting or weight loss. Risk factors include stress. She has tried laxatives, stool softeners and enemas for the symptoms. The treatment provided significant relief. Her past medical history is significant for endocrine disease.  URI  This is a new problem. The current episode started in the past 7 days. The problem has been gradually improving. There has been no fever. Associated symptoms  include abdominal pain, congestion, coughing, rhinorrhea, sinus pain, sneezing, a sore throat and wheezing. Pertinent negatives include no chest pain, diarrhea, dysuria, ear pain, joint pain, joint swelling, nausea, neck pain, plugged ear sensation, rash, swollen glands or vomiting. She has tried acetaminophen, decongestant, increased fluids and inhaler use for the symptoms. The treatment provided moderate relief.  previous bowel pattern is 1BM every 2-3days. Use of 0.5bottle of miralax 1week ago, last BM 3days ago.  Reviewed ED provide notes, lab and radiology report from 09/05/2019. Also reviewed last OV notes with endocrinology: Dr. Maple Hudson on 08/18/2019. She manages DM and HTN  Observations/Objective: Physical Exam  Constitutional: She is oriented to person, place, and time. No distress.  Eyes: Conjunctivae and EOM are normal.  Pulmonary/Chest: Effort normal.  Musculoskeletal:     Cervical back: Normal range of motion and neck supple.  Neurological: She is alert and oriented to person, place, and time.  Psychiatric: She has a normal mood and affect. Her behavior is normal. Thought content normal.   Assessment and Plan: Mella was seen today for hospitalization follow-up.  Diagnoses and all orders for this visit:  Acute nasopharyngitis -     Albuterol Sulfate (PROAIR RESPICLICK) 108 (90 Base) MCG/ACT AEPB; Inhale 1-2 puffs into the lungs every 6 (six) hours as needed. -     benzonatate (TESSALON) 100 MG capsule; Take 1-2 capsules (100-200 mg total) by mouth 3 (three) times daily as needed. -     guaiFENesin-dextromethorphan (ROBITUSSIN DM) 100-10 MG/5ML syrup; Take 15 mLs by mouth every 8 (eight) hours as needed for cough. -     fluticasone (FLONASE) 50 MCG/ACT nasal spray; Place 2 sprays into both nostrils daily. -  cetirizine (ZYRTEC) 10 MG tablet; Take 1 tablet (10 mg total) by mouth daily.  Acute bronchitis, unspecified organism -     Albuterol Sulfate (PROAIR RESPICLICK) 540 (90  Base) MCG/ACT AEPB; Inhale 1-2 puffs into the lungs every 6 (six) hours as needed. -     benzonatate (TESSALON) 100 MG capsule; Take 1-2 capsules (100-200 mg total) by mouth 3 (three) times daily as needed. -     guaiFENesin-dextromethorphan (ROBITUSSIN DM) 100-10 MG/5ML syrup; Take 15 mLs by mouth every 8 (eight) hours as needed for cough. -     fluticasone (FLONASE) 50 MCG/ACT nasal spray; Place 2 sprays into both nostrils daily. -     cetirizine (ZYRTEC) 10 MG tablet; Take 1 tablet (10 mg total) by mouth daily.  Constipation, unspecified constipation type -     polyethylene glycol powder (GLYCOLAX/MIRALAX) 17 GM/SCOOP powder; Take 17 g by mouth daily.   Follow Up Instructions: Use medications as directed above Call office if no improvement in 1week Avoid all OTC decongestants. Maintain adequate oral hydration and high fiber diet. F/up in 2week for CPE   I discussed the assessment and treatment plan with the patient. The patient was provided an opportunity to ask questions and all were answered. The patient agreed with the plan and demonstrated an understanding of the instructions.   The patient was advised to call back or seek an in-person evaluation if the symptoms worsen or if the condition fails to improve as anticipated.   Wilfred Lacy, NP

## 2019-09-22 NOTE — Patient Instructions (Addendum)
Use medications as directed above Call office if no improvement in 1week Avoid all OTC decongestants. Maintain adequate oral hydration and high fiber diet. F/up in 2week for CPE (fasting)  Constipation, Adult Constipation is when a person:  Poops (has a bowel movement) fewer times in a week than normal.  Has a hard time pooping.  Has poop that is dry, hard, or bigger than normal. Follow these instructions at home: Eating and drinking   Eat foods that have a lot of fiber, such as: ? Fresh fruits and vegetables. ? Whole grains. ? Beans.  Eat less of foods that are high in fat, low in fiber, or overly processed, such as: ? [place] fries. ? Hamburgers. ? Cookies. ? Candy. ? Soda.  Drink enough fluid to keep your pee (urine) clear or pale yellow. General instructions  Exercise regularly or as told by your doctor.  Go to the restroom when you feel like you need to poop. Do not hold it in.  Take over-the-counter and prescription medicines only as told by your doctor. These include any fiber supplements.  Do pelvic floor retraining exercises, such as: ? Doing deep breathing while relaxing your lower belly (abdomen). ? Relaxing your pelvic floor while pooping.  Watch your condition for any changes.  Keep all follow-up visits as told by your doctor. This is important. Contact a doctor if:  You have pain that gets worse.  You have a fever.  You have not pooped for 4 days.  You throw up (vomit).  You are not hungry.  You lose weight.  You are bleeding from the anus.  You have thin, pencil-like poop (stool). Get help right away if:  You have a fever, and your symptoms suddenly get worse.  You leak poop or have blood in your poop.  Your belly feels hard or bigger than normal (is bloated).  You have very bad belly pain.  You feel dizzy or you faint. This information is not intended to replace advice given to you by your health care provider. Make sure you  discuss any questions you have with your health care provider. Document Revised: 03/15/2017 Document Reviewed: 09/21/2015 Elsevier Patient Education  2020 [employer].

## 2019-10-16 ENCOUNTER — Telehealth: Payer: Self-pay

## 2019-10-16 NOTE — Telephone Encounter (Signed)
error 

## 2020-02-01 DIAGNOSIS — R112 Nausea with vomiting, unspecified: Secondary | ICD-10-CM | POA: Diagnosis not present

## 2020-02-01 DIAGNOSIS — I10 Essential (primary) hypertension: Secondary | ICD-10-CM | POA: Diagnosis not present

## 2020-02-01 DIAGNOSIS — E860 Dehydration: Secondary | ICD-10-CM | POA: Diagnosis not present

## 2020-02-01 DIAGNOSIS — R8290 Unspecified abnormal findings in urine: Secondary | ICD-10-CM | POA: Diagnosis not present

## 2020-02-01 DIAGNOSIS — E1065 Type 1 diabetes mellitus with hyperglycemia: Secondary | ICD-10-CM | POA: Diagnosis not present

## 2020-02-01 DIAGNOSIS — R5383 Other fatigue: Secondary | ICD-10-CM | POA: Diagnosis not present

## 2020-02-01 DIAGNOSIS — R0981 Nasal congestion: Secondary | ICD-10-CM | POA: Diagnosis not present

## 2020-02-01 DIAGNOSIS — R1084 Generalized abdominal pain: Secondary | ICD-10-CM | POA: Diagnosis not present

## 2020-02-01 DIAGNOSIS — Z6821 Body mass index (BMI) 21.0-21.9, adult: Secondary | ICD-10-CM | POA: Diagnosis not present

## 2020-02-02 DIAGNOSIS — R079 Chest pain, unspecified: Secondary | ICD-10-CM | POA: Diagnosis not present

## 2020-02-02 DIAGNOSIS — I499 Cardiac arrhythmia, unspecified: Secondary | ICD-10-CM | POA: Diagnosis not present

## 2020-02-02 DIAGNOSIS — R112 Nausea with vomiting, unspecified: Secondary | ICD-10-CM | POA: Diagnosis not present

## 2020-02-02 DIAGNOSIS — F329 Major depressive disorder, single episode, unspecified: Secondary | ICD-10-CM | POA: Diagnosis not present

## 2020-02-02 DIAGNOSIS — K219 Gastro-esophageal reflux disease without esophagitis: Secondary | ICD-10-CM | POA: Diagnosis not present

## 2020-02-02 DIAGNOSIS — I10 Essential (primary) hypertension: Secondary | ICD-10-CM | POA: Diagnosis not present

## 2020-02-03 DIAGNOSIS — Z6821 Body mass index (BMI) 21.0-21.9, adult: Secondary | ICD-10-CM | POA: Diagnosis not present

## 2020-02-03 DIAGNOSIS — E1065 Type 1 diabetes mellitus with hyperglycemia: Secondary | ICD-10-CM | POA: Diagnosis not present

## 2020-02-04 DIAGNOSIS — R112 Nausea with vomiting, unspecified: Secondary | ICD-10-CM | POA: Diagnosis not present

## 2020-02-04 DIAGNOSIS — K838 Other specified diseases of biliary tract: Secondary | ICD-10-CM | POA: Diagnosis not present

## 2020-02-04 DIAGNOSIS — Z87891 Personal history of nicotine dependence: Secondary | ICD-10-CM | POA: Diagnosis not present

## 2020-02-04 DIAGNOSIS — E0590 Thyrotoxicosis, unspecified without thyrotoxic crisis or storm: Secondary | ICD-10-CM | POA: Diagnosis not present

## 2020-02-04 DIAGNOSIS — Z20822 Contact with and (suspected) exposure to covid-19: Secondary | ICD-10-CM | POA: Diagnosis not present

## 2020-02-04 DIAGNOSIS — E109 Type 1 diabetes mellitus without complications: Secondary | ICD-10-CM | POA: Diagnosis not present

## 2020-02-04 DIAGNOSIS — Z794 Long term (current) use of insulin: Secondary | ICD-10-CM | POA: Diagnosis not present

## 2020-02-04 DIAGNOSIS — R1013 Epigastric pain: Secondary | ICD-10-CM | POA: Diagnosis not present

## 2020-02-04 DIAGNOSIS — Z79899 Other long term (current) drug therapy: Secondary | ICD-10-CM | POA: Diagnosis not present

## 2020-02-04 DIAGNOSIS — I10 Essential (primary) hypertension: Secondary | ICD-10-CM | POA: Diagnosis not present

## 2020-03-25 DIAGNOSIS — K5900 Constipation, unspecified: Secondary | ICD-10-CM | POA: Diagnosis not present

## 2020-03-25 DIAGNOSIS — R112 Nausea with vomiting, unspecified: Secondary | ICD-10-CM | POA: Diagnosis not present

## 2020-03-25 DIAGNOSIS — R109 Unspecified abdominal pain: Secondary | ICD-10-CM | POA: Diagnosis not present

## 2020-05-09 DIAGNOSIS — R011 Cardiac murmur, unspecified: Secondary | ICD-10-CM | POA: Diagnosis not present

## 2020-05-09 DIAGNOSIS — E1065 Type 1 diabetes mellitus with hyperglycemia: Secondary | ICD-10-CM | POA: Diagnosis not present

## 2020-05-09 DIAGNOSIS — R112 Nausea with vomiting, unspecified: Secondary | ICD-10-CM | POA: Diagnosis not present

## 2020-05-09 DIAGNOSIS — E0590 Thyrotoxicosis, unspecified without thyrotoxic crisis or storm: Secondary | ICD-10-CM | POA: Diagnosis not present

## 2020-05-09 DIAGNOSIS — Z79899 Other long term (current) drug therapy: Secondary | ICD-10-CM | POA: Diagnosis not present

## 2020-05-09 DIAGNOSIS — Z87891 Personal history of nicotine dependence: Secondary | ICD-10-CM | POA: Diagnosis not present

## 2020-05-09 DIAGNOSIS — R739 Hyperglycemia, unspecified: Secondary | ICD-10-CM | POA: Diagnosis not present

## 2020-05-09 DIAGNOSIS — Z794 Long term (current) use of insulin: Secondary | ICD-10-CM | POA: Diagnosis not present

## 2020-05-09 DIAGNOSIS — I10 Essential (primary) hypertension: Secondary | ICD-10-CM | POA: Diagnosis not present

## 2020-05-09 DIAGNOSIS — K219 Gastro-esophageal reflux disease without esophagitis: Secondary | ICD-10-CM | POA: Diagnosis not present

## 2020-05-09 DIAGNOSIS — E860 Dehydration: Secondary | ICD-10-CM | POA: Diagnosis not present

## 2020-05-09 DIAGNOSIS — R1084 Generalized abdominal pain: Secondary | ICD-10-CM | POA: Diagnosis not present

## 2020-05-11 ENCOUNTER — Emergency Department (HOSPITAL_COMMUNITY)
Admission: EM | Admit: 2020-05-11 | Discharge: 2020-05-11 | Disposition: A | Discharge status: Home / Self Care | Payer: Medicaid Other | Attending: Emergency Medicine | Admitting: Emergency Medicine

## 2020-05-11 ENCOUNTER — Encounter (HOSPITAL_COMMUNITY): Payer: Self-pay | Admitting: Emergency Medicine

## 2020-05-11 DIAGNOSIS — F17210 Nicotine dependence, cigarettes, uncomplicated: Secondary | ICD-10-CM | POA: Insufficient documentation

## 2020-05-11 DIAGNOSIS — J45909 Unspecified asthma, uncomplicated: Secondary | ICD-10-CM | POA: Insufficient documentation

## 2020-05-11 DIAGNOSIS — E1010 Type 1 diabetes mellitus with ketoacidosis without coma: Secondary | ICD-10-CM | POA: Insufficient documentation

## 2020-05-11 DIAGNOSIS — E1065 Type 1 diabetes mellitus with hyperglycemia: Secondary | ICD-10-CM | POA: Diagnosis not present

## 2020-05-11 DIAGNOSIS — Z7952 Long term (current) use of systemic steroids: Secondary | ICD-10-CM | POA: Diagnosis not present

## 2020-05-11 DIAGNOSIS — R109 Unspecified abdominal pain: Secondary | ICD-10-CM | POA: Insufficient documentation

## 2020-05-11 DIAGNOSIS — R112 Nausea with vomiting, unspecified: Secondary | ICD-10-CM

## 2020-05-11 DIAGNOSIS — Z79899 Other long term (current) drug therapy: Secondary | ICD-10-CM | POA: Insufficient documentation

## 2020-05-11 DIAGNOSIS — Z794 Long term (current) use of insulin: Secondary | ICD-10-CM | POA: Diagnosis not present

## 2020-05-11 DIAGNOSIS — R739 Hyperglycemia, unspecified: Secondary | ICD-10-CM

## 2020-05-11 DIAGNOSIS — E1165 Type 2 diabetes mellitus with hyperglycemia: Secondary | ICD-10-CM | POA: Diagnosis not present

## 2020-05-11 DIAGNOSIS — I10 Essential (primary) hypertension: Secondary | ICD-10-CM | POA: Insufficient documentation

## 2020-05-11 LAB — COMPREHENSIVE METABOLIC PANEL
ALT: 12 U/L (ref 0–44)
AST: 21 U/L (ref 15–41)
Albumin: 4 g/dL (ref 3.5–5.0)
Alkaline Phosphatase: 76 U/L (ref 38–126)
Anion gap: 14 (ref 5–15)
BUN: 13 mg/dL (ref 6–20)
CO2: 22 mmol/L (ref 22–32)
Calcium: 9.8 mg/dL (ref 8.9–10.3)
Chloride: 100 mmol/L (ref 98–111)
Creatinine, Ser: 1.09 mg/dL — ABNORMAL HIGH (ref 0.44–1.00)
GFR, Estimated: >60 mL/min (ref >60)
Glucose, Bld: 339 mg/dL — ABNORMAL HIGH (ref 70–99)
Potassium: 4 mmol/L (ref 3.5–5.1)
Sodium: 136 mmol/L (ref 135–145)
Total Bilirubin: 0.7 mg/dL (ref 0.3–1.2)
Total Protein: 7.2 g/dL (ref 6.5–8.1)

## 2020-05-11 LAB — I-STAT VENOUS BLOOD GAS, ED
Acid-Base Excess: 1 mmol/L (ref 0.0–2.0)
Bicarbonate: 23 mmol/L (ref 20.0–28.0)
Calcium, Ion: 1.16 mmol/L (ref 1.15–1.40)
HCT: 40 % (ref 36.0–46.0)
Hemoglobin: 13.6 g/dL (ref 12.0–15.0)
O2 Saturation: 86 %
Potassium: 3.9 mmol/L (ref 3.5–5.1)
Sodium: 136 mmol/L (ref 135–145)
TCO2: 24 mmol/L (ref 22–32)
pCO2, Ven: 28 mmHg — ABNORMAL LOW (ref 44.0–60.0)
pH, Ven: 7.522 — ABNORMAL HIGH (ref 7.250–7.430)
pO2, Ven: 44 mmHg (ref 32.0–45.0)

## 2020-05-11 LAB — CBC
HCT: 38.1 % (ref 36.0–46.0)
Hemoglobin: 13.5 g/dL (ref 12.0–15.0)
MCH: 31.2 pg (ref 26.0–34.0)
MCHC: 35.4 g/dL (ref 30.0–36.0)
MCV: 88 fL (ref 80.0–100.0)
Platelets: 342 10*3/uL (ref 150–400)
RBC: 4.33 MIL/uL (ref 3.87–5.11)
RDW: 12.2 % (ref 11.5–15.5)
WBC: 7.3 10*3/uL (ref 4.0–10.5)
nRBC: 0 % (ref 0.0–0.2)

## 2020-05-11 LAB — I-STAT BETA HCG BLOOD, ED (MC, WL, AP ONLY): I-stat hCG, quantitative: <5 m[IU]/mL (ref <5)

## 2020-05-11 LAB — LIPASE, BLOOD: Lipase: 19 U/L (ref 11–51)

## 2020-05-11 LAB — CBG MONITORING, ED
Glucose-Capillary: 306 mg/dL — ABNORMAL HIGH (ref 70–99)
Glucose-Capillary: 393 mg/dL — ABNORMAL HIGH (ref 70–99)
Glucose-Capillary: 329 mg/dL — ABNORMAL HIGH (ref 70–99)

## 2020-05-11 LAB — BETA-HYDROXYBUTYRIC ACID: Beta-Hydroxybutyric Acid: 2.05 mmol/L — ABNORMAL HIGH (ref 0.05–0.27)

## 2020-05-11 MED ORDER — INSULIN ASPART 100 UNIT/ML ~~LOC~~ SOLN
8.0000 [IU] | Freq: Once | SUBCUTANEOUS | Status: AC
  Administered 2020-05-11: 8 [IU] via SUBCUTANEOUS

## 2020-05-11 MED ORDER — ONDANSETRON 4 MG PO TBDP
4.0000 mg | ORAL_TABLET | Freq: Once | ORAL | Status: AC
  Administered 2020-05-11: 4 mg via ORAL
  Filled 2020-05-11: qty 1

## 2020-05-11 MED ORDER — CAPSAICIN 0.025 % EX CREA
1.0000 "application " | TOPICAL_CREAM | Freq: Once | CUTANEOUS | Status: AC
  Administered 2020-05-11: 1 via TOPICAL
  Filled 2020-05-11: qty 60

## 2020-05-11 MED ORDER — METOCLOPRAMIDE HCL 5 MG/ML IJ SOLN
10.0000 mg | Freq: Once | INTRAMUSCULAR | Status: AC
  Administered 2020-05-11: 10 mg via INTRAVENOUS
  Filled 2020-05-11: qty 2

## 2020-05-11 MED ORDER — INSULIN ASPART PROT & ASPART (70-30 MIX) 100 UNIT/ML ~~LOC~~ SUSP
8.0000 [IU] | Freq: Once | SUBCUTANEOUS | Status: AC
  Administered 2020-05-11: 8 [IU] via SUBCUTANEOUS
  Filled 2020-05-11: qty 10

## 2020-05-11 MED ORDER — METOCLOPRAMIDE HCL 10 MG PO TABS: 10.0000 mg | ORAL_TABLET | Freq: Four times a day (QID) | ORAL | 0 refills | Status: DC

## 2020-05-11 MED ORDER — LACTATED RINGERS IV BOLUS
1000.0000 mL | Freq: Once | INTRAVENOUS | Status: AC
  Administered 2020-05-11: 1000 mL via INTRAVENOUS

## 2020-05-11 NOTE — ED Notes (Signed)
Discharge instructions discussed with pt. Pt verbalized understanding. Pt stable and ambulatory. No signature pad available. 

## 2020-05-11 NOTE — ED Provider Notes (Signed)
[facility] EMERGENCY DEPARTMENT Provider Note   CSN: [id number] Arrival date & time: 05/11/20  1312     History Chief Complaint  Patient presents with  . Emesis    [name] is a 30 y.o. female.  Patient presents with chief complaint of vomiting abdominal pain.  She states this is been going on for the past 4 to 5 days.  She has had these episodes multiple times over the course the last 3 months.  She saw a gastroenterologist who told her she had a type of gastroparesis.  She was admitted to an outside hospital several days ago with improvement but now appears to be having exacerbation.  She states that she was supposed to get Reglan on an outpatient basis but never received it.  Denies fevers or cough.  No diarrhea.  Describes the vomitus as nonbloody.        Past Medical History:  Diagnosis Date  . Asthma   . Diabetes mellitus    type 1   . Essential hypertension, benign 02/10/2016    Patient Active Problem List   Diagnosis Date Noted  . Low grade squamous intraepith lesion on cytologic smear cervix (lgsil) 09/22/2019  . Constipation 09/06/2019  . Essential hypertension, benign 02/10/2016  . Generalized abdominal pain   . Abdominal pain, epigastric 04/10/2013  . Non-intractable vomiting 04/10/2013  . Menorrhagia with irregular cycle 11/26/2012  . Encounter for Norplant removal 11/26/2012  . Hyperglycemia 11/13/2012  . Type 1 diabetes mellitus (HCC) 11/13/2012  . Subclinical hyperthyroidism 07/21/2012  . Vitamin D deficiency 12/31/2011  . Exercise-induced asthma 10/16/2011  . DKA (diabetic ketoacidoses) 04/29/2011  . Noncompliance 04/29/2011  . Other chronic cystitis 10/13/2009  . Primary hypercoagulable state (HCC) 10/13/2009    History reviewed. No pertinent surgical history.   OB History   No obstetric history on file.     Family History  Problem Relation Age of Onset  . Hypertension Paternal Grandmother     Social History    Tobacco Use  . Smoking status: Current Some Day Smoker    Types: Cigarettes  . Smokeless tobacco: Current User  Substance Use Topics  . Alcohol use: No  . Drug use: No    Home Medications Prior to Admission medications   Medication Sig Start Date End Date Taking? Authorizing Provider  metoCLOPramide (REGLAN) 10 MG tablet Take 1 tablet (10 mg total) by mouth every 6 (six) hours for 10 doses. 05/11/20 05/14/20 Yes Cheryll Cockayne, MD  Albuterol Sulfate (PROAIR RESPICLICK) 108 (90 Base) MCG/ACT AEPB Inhale 1-2 puffs into the lungs every 6 (six) hours as needed. 09/22/19   Nche, Bonna Gains, NP  benzonatate (TESSALON) 100 MG capsule Take 1-2 capsules (100-200 mg total) by mouth 3 (three) times daily as needed. 09/22/19   Nche, Bonna Gains, NP  cetirizine (ZYRTEC) 10 MG tablet Take 1 tablet (10 mg total) by mouth daily. 09/22/19   Nche, Bonna Gains, NP  Continuous Blood Gluc Sensor (FREESTYLE LIBRE 14 DAY SENSOR) MISC by Other route every fourteen (14) days. One libre sensor every 14 days 01/21/18   [provider]  fluticasone (FLONASE) 50 MCG/ACT nasal spray Place 2 sprays into both nostrils daily. 09/22/19   Nche, Bonna Gains, NP  guaiFENesin-dextromethorphan (ROBITUSSIN DM) 100-10 MG/5ML syrup Take 15 mLs by mouth every 8 (eight) hours as needed for cough. 09/22/19   Nche, Bonna Gains, NP  Insulin Pen Needle (ULTRA-THIN II SHORT PEN NEEDLE) 31G X 8 MM MISC Inject into  the skin. 01/21/18   [provider]  lisinopril (ZESTRIL) 10 MG tablet Take 20 mg by mouth daily. 09/09/19   [provider]  NOVOLOG FLEXPEN 100 UNIT/ML FlexPen Inject 1-9 Units into the skin 3 (three) times daily with meals.  07/22/16   [provider]  ondansetron (ZOFRAN-ODT) 4 MG disintegrating tablet Take 1 tablet (4 mg total) by mouth every 8 (eight) hours as needed for nausea or vomiting. 06/05/17   Mardella Layman, MD  polyethylene glycol powder (GLYCOLAX/MIRALAX) 17 GM/SCOOP powder Take 17 g  by mouth daily. 09/22/19   Nche, Bonna Gains, NP  ranitidine (ZANTAC) 75 MG tablet Take 75 mg by mouth daily as needed for heartburn.    [provider]  TRESIBA FLEXTOUCH 100 UNIT/ML FlexTouch Pen  06/29/19   [provider]    Allergies    Aleve [naproxen sodium]  Review of Systems   Review of Systems  Constitutional: Negative for fever.  HENT: Negative for ear pain.   Eyes: Negative for pain.  Respiratory: Negative for cough.   Cardiovascular: Negative for chest pain.  Gastrointestinal: Positive for abdominal pain.  Genitourinary: Negative for flank pain.  Musculoskeletal: Negative for back pain.  Skin: Negative for rash.  Neurological: Negative for headaches.    Physical Exam Updated Vital Signs BP 127/90 (BP Location: Right Arm)   Pulse (!) 110   Temp 98.2 F (36.8 C)   Resp 18   SpO2 100%   Physical Exam Constitutional:      General: She is not in acute distress.    Appearance: Normal appearance.  HENT:     Head: Normocephalic.     Nose: Nose normal.  Eyes:     Extraocular Movements: Extraocular movements intact.  Cardiovascular:     Rate and Rhythm: Normal rate.  Pulmonary:     Effort: Pulmonary effort is normal.  Abdominal:     General: Abdomen is flat. There is no distension.     Palpations: Abdomen is soft.     Tenderness: There is no abdominal tenderness.  Musculoskeletal:        General: Normal range of motion.     Cervical back: Normal range of motion.  Neurological:     General: No focal deficit present.     Mental Status: She is alert. Mental status is at baseline.     ED Results / Procedures / Treatments   Labs (all labs ordered are listed, but only abnormal results are displayed) Labs Reviewed  COMPREHENSIVE METABOLIC PANEL - Abnormal; Notable for the following components:      Result Value   Glucose, Bld 339 (*)    Creatinine, Ser 1.09 (*)    All other components within normal limits  BETA-HYDROXYBUTYRIC ACID -  Abnormal; Notable for the following components:   Beta-Hydroxybutyric Acid 2.05 (*)    All other components within normal limits  CBG MONITORING, ED - Abnormal; Notable for the following components:   Glucose-Capillary 306 (*)    All other components within normal limits  I-STAT VENOUS BLOOD GAS, ED - Abnormal; Notable for the following components:   pH, Ven 7.522 (*)    pCO2, Ven 28.0 (*)    All other components within normal limits  CBG MONITORING, ED - Abnormal; Notable for the following components:   Glucose-Capillary 393 (*)    All other components within normal limits  CBG MONITORING, ED - Abnormal; Notable for the following components:   Glucose-Capillary 329 (*)    All other  components within normal limits  LIPASE, BLOOD  CBC  URINALYSIS, ROUTINE W REFLEX MICROSCOPIC  I-STAT BETA HCG BLOOD, ED ([facility], [facility], [facility] ONLY)    EKG None  Radiology No results found.  Procedures Procedures   Medications Ordered in ED Medications  insulin aspart (novoLOG) injection 8 Units (has no administration in time range)  lactated ringers bolus 1,000 mL (1,000 mLs Intravenous New Bag/Given 05/11/20 1731)  ondansetron (ZOFRAN-ODT) disintegrating tablet 4 mg (4 mg Oral Given 05/11/20 1723)  metoCLOPramide (REGLAN) injection 10 mg (10 mg Intravenous Given 05/11/20 1732)  capsaicin (ZOSTRIX) 0.025 % cream 1 application (1 application Topical Given 05/11/20 1818)  insulin aspart protamine- aspart (NOVOLOG MIX 70/30) injection 8 Units (8 Units Subcutaneous Given 05/11/20 1924)    ED Course  I have reviewed the triage vital signs and the nursing notes.  Pertinent labs & imaging results that were available during my care of the patient were reviewed by me and considered in my medical decision making (see chart for details).    MDM Rules/Calculators/A&P                          Labs consistent with hyperglycemia.  Anion gap is normal.  Bicarb is normal.  Beta hydroxybutyrate slightly elevated at 2.0.   Patient given IV fluid bolus Reglan and Zofran.  Given capsaicin cream on her abdomen.  Patient expresses significant improvement given treatment here in the ER.  Blood sugar was elevated but no evidence of DKA.  Given IV fluid hydration and subcu insulin with improvement in blood sugar.  Discharged home stable condition.  Advise close follow-up and monitoring with her primary care doctor in 2 or 3 days.  Advised immediate return for recurrence of symptoms persistent vomiting or any additional concerns.  Patient trialed p.o. here and was able to tolerate oral hydration without throwing up.   Final Clinical Impression(s) / ED Diagnoses Final diagnoses:  Non-intractable vomiting with nausea, unspecified vomiting type  Hyperglycemia    Rx / DC Orders ED Discharge Orders         Ordered    metoCLOPramide (REGLAN) 10 MG tablet  Every 6 hours        05/11/20 2006           Cheryll Cockayne, MD 05/11/20 2007

## 2020-05-11 NOTE — ED Triage Notes (Signed)
Emergency Medicine Provider Triage Evaluation Note  [name] , a 30 y.o. female  was evaluated in triage.  Pt complains of nausea and vomiting.  She was seen 03/09/2020 at a [facility] hospital for hypoglycemia, was told she has gastroparesis.  She was supposed to be getting Reglan delivered, per her report however has not gotten that yet. She reports that yesterday was the last time she was able to keep anything including water down. She has type 1 diabetes. She reports that she took insulin last this morning with 4 units of NovoLog.  She feels like she may be in DKA.  She reports that since the same thing that she was seen for at [facility]. She denies any marijuana use or fevers.  She does report that she has had Zofran before, tolerates it well and that it does help a little bit and would wish for some now.   Review of Systems  Positive: N/V Negative: Syncope, cough, shob  Physical Exam  BP (!) 181/108 (BP Location: Right Arm)   Pulse (!) 117   Temp 98.2 F (36.8 C)   Resp 18   SpO2 100%  Gen:   Awake, appears to feel unwell dry heaving HEENT:  Atraumatic  Resp:  Normal effort  Cardiac:  Tachycardia Abd:   TTP diffuse, primarily upper abdomen MSK:   Moves extremities without difficulty  Neuro:  Speech clear   Medical Decision Making  Medically screening exam initiated at 1:20 PM.  Appropriate orders placed.  Lamanda Coale was informed that the remainder of the evaluation will be completed by another provider, this initial triage assessment does not replace that evaluation, and the importance of remaining in the ED until their evaluation is complete.  Clinical Impression  Unable to tolerate PO intake, concern for dka +/- dehydration due to N/V.     Cristina Gong, [place] 05/11/20 1332

## 2020-05-11 NOTE — ED Triage Notes (Signed)
Patient was diagnosed with gastroparesis on Monday in [place], was supposed to have reglan delivered from a pharmacy but never received it. Now complaining of abdominal pain, nausea, and vomiting.

## 2020-05-11 NOTE — Discharge Instructions (Signed)
Call your primary care doctor or specialist as discussed in the next 2-3 days.   Return immediately back to the ER if:  Your symptoms worsen within the next 12-24 hours. You develop new symptoms such as new fevers, persistent vomiting, new pain, shortness of breath, or new weakness or numbness, or if you have any other concerns.  

## 2020-06-15 ENCOUNTER — Other Ambulatory Visit: Payer: Self-pay

## 2020-06-15 ENCOUNTER — Encounter (HOSPITAL_COMMUNITY): Payer: Self-pay | Admitting: Emergency Medicine

## 2020-06-15 ENCOUNTER — Ambulatory Visit (HOSPITAL_COMMUNITY)
Admission: EM | Admit: 2020-06-15 | Discharge: 2020-06-15 | Disposition: A | Discharge status: Home / Self Care | Payer: Medicaid Other | Attending: Family Medicine | Admitting: Family Medicine

## 2020-06-15 DIAGNOSIS — K0889 Other specified disorders of teeth and supporting structures: Secondary | ICD-10-CM

## 2020-06-15 MED ORDER — CLINDAMYCIN HCL 300 MG PO CAPS: 300.0000 mg | ORAL_CAPSULE | Freq: Three times a day (TID) | ORAL | 0 refills | Status: DC

## 2020-06-15 MED ORDER — HYDROCODONE-ACETAMINOPHEN 5-325 MG PO TABS: 1.0000 | ORAL_TABLET | Freq: Four times a day (QID) | ORAL | 0 refills | Status: DC | PRN

## 2020-06-15 NOTE — ED Provider Notes (Signed)
  [facility]   [id number] 06/15/20 Arrival Time: 1035  ASSESSMENT & PLAN:  1. Pain, dental     No sign of abscess requiring I&D at this time. Discussed.  Meds ordered this encounter  Medications  . HYDROcodone-acetaminophen (NORCO/VICODIN) 5-325 MG tablet    Sig: Take 1 tablet by mouth every 6 (six) hours as needed for moderate pain or severe pain.    Dispense:  8 tablet    Refill:  0  . clindamycin (CLEOCIN) 300 MG capsule    Sig: Take 1 capsule (300 mg total) by mouth 3 (three) times daily.    Dispense:  30 capsule    Refill:  0    [place] Controlled Substances Registry consulted for this patient. I feel the risk/benefit ratio today is favorable for proceeding with this prescription for a controlled substance. Medication sedation precautions given.  She will schedule dental evaluation as soon as possible if not improving over the next 24-48 hours.  Reviewed expectations re: course of current medical issues. Questions answered. Outlined signs and symptoms indicating need for more acute intervention. Patient verbalized understanding. After Visit Summary given.   SUBJECTIVE:  [name] is a 30 y.o. female who reports gradual onset of right upper dental pain described as aching/throbbing. Present for a few days. Fever: absent. Tolerating PO intake but reports pain with chewing. Normal swallowing. She has a Education officer, community; appt next week. No neck swelling or pain. OTC analgesics without relief.   OBJECTIVE: Vitals:   06/15/20 1059  BP: (!) 149/92  Pulse: 100  Resp: 16  Temp: 98.2 F (36.8 C)  TempSrc: Oral  SpO2: 100%    General appearance: alert; no distress HENT: normocephalic; atraumatic; dentition: fair; right upper gum without areas of fluctuance, drainage, or bleeding but with tenderness to palpation; normal jaw movement without difficulty Neck: supple without LAD; FROM; trachea midline Lungs: normal respirations; unlabored; speaks full sentences without  difficulty Skin: warm and dry Psychological: alert and cooperative; normal mood and affect  Allergies  Allergen Reactions  . Aleve [Naproxen Sodium] Swelling    Past Medical History:  Diagnosis Date  . Asthma   . Diabetes mellitus    type 1   . Essential hypertension, benign 02/10/2016   Social History   Socioeconomic History  . Marital status: Single    Spouse name: Not on file  . Number of children: Not on file  . Years of education: Not on file  . Highest education level: Not on file  Occupational History  . Not on file  Tobacco Use  . Smoking status: Current Some Day Smoker    Types: Cigarettes  . Smokeless tobacco: Current User  Substance and Sexual Activity  . Alcohol use: No  . Drug use: No  . Sexual activity: Yes  Other Topics Concern  . Not on file  Social History Narrative  . Not on file   Social Determinants of Health   Financial Resource Strain: Not on file  Food Insecurity: Not on file  Transportation Needs: Not on file  Physical Activity: Not on file  Stress: Not on file  Social Connections: Not on file  Intimate Partner Violence: Not on file   Family History  Problem Relation Age of Onset  . Hypertension Paternal Grandmother    History reviewed. No pertinent surgical history.   Mardella Layman, MD 06/16/20 1123

## 2020-06-15 NOTE — Discharge Instructions (Signed)

## 2020-06-15 NOTE — ED Triage Notes (Signed)
Pt presents with right side dental pain and swelling. States could not get appt with dentist until next week.  Has tried tylenol with minimal relief.

## 2020-06-20 ENCOUNTER — Other Ambulatory Visit: Payer: Self-pay | Admitting: Gastroenterology

## 2020-06-20 DIAGNOSIS — R1084 Generalized abdominal pain: Secondary | ICD-10-CM | POA: Diagnosis not present

## 2020-06-20 DIAGNOSIS — R112 Nausea with vomiting, unspecified: Secondary | ICD-10-CM | POA: Diagnosis not present

## 2020-06-20 DIAGNOSIS — R634 Abnormal weight loss: Secondary | ICD-10-CM | POA: Diagnosis not present

## 2020-06-23 ENCOUNTER — Other Ambulatory Visit: Payer: Self-pay

## 2020-06-23 ENCOUNTER — Observation Stay (HOSPITAL_COMMUNITY)
Admission: EM | Admit: 2020-06-23 | Discharge: 2020-06-25 | Disposition: A | Discharge status: Home / Self Care | Payer: Medicaid Other | Attending: Internal Medicine | Admitting: Internal Medicine

## 2020-06-23 ENCOUNTER — Observation Stay (HOSPITAL_COMMUNITY): Payer: Medicaid Other

## 2020-06-23 ENCOUNTER — Encounter (HOSPITAL_COMMUNITY): Payer: Self-pay | Admitting: Family Medicine

## 2020-06-23 DIAGNOSIS — R112 Nausea with vomiting, unspecified: Secondary | ICD-10-CM | POA: Diagnosis not present

## 2020-06-23 DIAGNOSIS — E1065 Type 1 diabetes mellitus with hyperglycemia: Secondary | ICD-10-CM | POA: Diagnosis not present

## 2020-06-23 DIAGNOSIS — I160 Hypertensive urgency: Secondary | ICD-10-CM | POA: Insufficient documentation

## 2020-06-23 DIAGNOSIS — F17210 Nicotine dependence, cigarettes, uncomplicated: Secondary | ICD-10-CM | POA: Diagnosis not present

## 2020-06-23 DIAGNOSIS — R739 Hyperglycemia, unspecified: Secondary | ICD-10-CM

## 2020-06-23 DIAGNOSIS — Z79899 Other long term (current) drug therapy: Secondary | ICD-10-CM | POA: Diagnosis not present

## 2020-06-23 DIAGNOSIS — Z20822 Contact with and (suspected) exposure to covid-19: Secondary | ICD-10-CM | POA: Insufficient documentation

## 2020-06-23 DIAGNOSIS — R000 Tachycardia, unspecified: Secondary | ICD-10-CM | POA: Insufficient documentation

## 2020-06-23 DIAGNOSIS — J45909 Unspecified asthma, uncomplicated: Secondary | ICD-10-CM | POA: Insufficient documentation

## 2020-06-23 DIAGNOSIS — E109 Type 1 diabetes mellitus without complications: Secondary | ICD-10-CM | POA: Diagnosis present

## 2020-06-23 DIAGNOSIS — K5900 Constipation, unspecified: Secondary | ICD-10-CM | POA: Diagnosis present

## 2020-06-23 DIAGNOSIS — I10 Essential (primary) hypertension: Secondary | ICD-10-CM | POA: Diagnosis not present

## 2020-06-23 DIAGNOSIS — Z794 Long term (current) use of insulin: Secondary | ICD-10-CM | POA: Insufficient documentation

## 2020-06-23 LAB — COMPREHENSIVE METABOLIC PANEL
ALT: 12 U/L (ref 0–44)
AST: 18 U/L (ref 15–41)
Albumin: 3.9 g/dL (ref 3.5–5.0)
Alkaline Phosphatase: 77 U/L (ref 38–126)
Anion gap: 11 (ref 5–15)
BUN: 14 mg/dL (ref 6–20)
CO2: 20 mmol/L — ABNORMAL LOW (ref 22–32)
Calcium: 10.1 mg/dL (ref 8.9–10.3)
Chloride: 103 mmol/L (ref 98–111)
Creatinine, Ser: 0.96 mg/dL (ref 0.44–1.00)
GFR, Estimated: >60 mL/min (ref >60)
Glucose, Bld: 358 mg/dL — ABNORMAL HIGH (ref 70–99)
Potassium: 4.8 mmol/L (ref 3.5–5.1)
Sodium: 134 mmol/L — ABNORMAL LOW (ref 135–145)
Total Bilirubin: 0.8 mg/dL (ref 0.3–1.2)
Total Protein: 7.8 g/dL (ref 6.5–8.1)

## 2020-06-23 LAB — I-STAT CHEM 8, ED
BUN: 16 mg/dL (ref 6–20)
Calcium, Ion: 1.29 mmol/L (ref 1.15–1.40)
Chloride: 103 mmol/L (ref 98–111)
Creatinine, Ser: 0.6 mg/dL (ref 0.44–1.00)
Glucose, Bld: 330 mg/dL — ABNORMAL HIGH (ref 70–99)
HCT: 36 % (ref 36.0–46.0)
Hemoglobin: 12.2 g/dL (ref 12.0–15.0)
Potassium: 4.5 mmol/L (ref 3.5–5.1)
Sodium: 136 mmol/L (ref 135–145)
TCO2: 22 mmol/L (ref 22–32)

## 2020-06-23 LAB — URINALYSIS, ROUTINE W REFLEX MICROSCOPIC
Bilirubin Urine: NEGATIVE
Glucose, UA: >=500 mg/dL — AB
Ketones, ur: 20 mg/dL — AB
Leukocytes,Ua: NEGATIVE
Nitrite: NEGATIVE
Protein, ur: 100 mg/dL — AB
Specific Gravity, Urine: 1.016 (ref 1.005–1.030)
pH: 7 (ref 5.0–8.0)

## 2020-06-23 LAB — CBC
HCT: 39 % (ref 36.0–46.0)
Hemoglobin: 12.6 g/dL (ref 12.0–15.0)
MCH: 29.6 pg (ref 26.0–34.0)
MCHC: 32.3 g/dL (ref 30.0–36.0)
MCV: 91.8 fL (ref 80.0–100.0)
Platelets: 360 10*3/uL (ref 150–400)
RBC: 4.25 MIL/uL (ref 3.87–5.11)
RDW: 12.1 % (ref 11.5–15.5)
WBC: 8.1 10*3/uL (ref 4.0–10.5)
nRBC: 0 % (ref 0.0–0.2)

## 2020-06-23 LAB — CBG MONITORING, ED
Glucose-Capillary: 243 mg/dL — ABNORMAL HIGH (ref 70–99)
Glucose-Capillary: 281 mg/dL — ABNORMAL HIGH (ref 70–99)
Glucose-Capillary: 333 mg/dL — ABNORMAL HIGH (ref 70–99)

## 2020-06-23 LAB — I-STAT BETA HCG BLOOD, ED (MC, WL, AP ONLY): I-stat hCG, quantitative: <5 m[IU]/mL (ref <5)

## 2020-06-23 LAB — LIPASE, BLOOD: Lipase: 29 U/L (ref 11–51)

## 2020-06-23 MED ORDER — ONDANSETRON HCL 4 MG/2ML IJ SOLN: 4.0000 mg | Freq: Four times a day (QID) | INTRAMUSCULAR | Status: DC | PRN

## 2020-06-23 MED ORDER — ONDANSETRON 4 MG PO TBDP
4.0000 mg | ORAL_TABLET | Freq: Once | ORAL | Status: AC | PRN
  Administered 2020-06-23: 4 mg via ORAL
  Filled 2020-06-23: qty 1

## 2020-06-23 MED ORDER — METOCLOPRAMIDE HCL 5 MG/ML IJ SOLN
10.0000 mg | Freq: Three times a day (TID) | INTRAMUSCULAR | Status: DC
  Administered 2020-06-23 – 2020-06-24 (×3): 10 mg via INTRAVENOUS
  Filled 2020-06-23 (×4): qty 2

## 2020-06-23 MED ORDER — LACTATED RINGERS IV BOLUS
1000.0000 mL | Freq: Once | INTRAVENOUS | Status: AC
  Administered 2020-06-23: 1000 mL via INTRAVENOUS

## 2020-06-23 MED ORDER — INSULIN GLARGINE 100 UNIT/ML ~~LOC~~ SOLN
10.0000 [IU] | Freq: Every day | SUBCUTANEOUS | Status: DC
  Administered 2020-06-23 – 2020-06-24 (×2): 10 [IU] via SUBCUTANEOUS
  Filled 2020-06-23 (×3): qty 0.1

## 2020-06-23 MED ORDER — ONDANSETRON HCL 4 MG PO TABS
4.0000 mg | ORAL_TABLET | Freq: Four times a day (QID) | ORAL | Status: DC | PRN
  Administered 2020-06-24: 4 mg via ORAL
  Filled 2020-06-23: qty 1

## 2020-06-23 MED ORDER — BISACODYL 10 MG RE SUPP: 10.0000 mg | Freq: Every day | RECTAL | Status: DC | PRN

## 2020-06-23 MED ORDER — ACETAMINOPHEN 325 MG PO TABS: 650.0000 mg | ORAL_TABLET | Freq: Four times a day (QID) | ORAL | Status: DC | PRN

## 2020-06-23 MED ORDER — ACETAMINOPHEN 650 MG RE SUPP: 650.0000 mg | Freq: Four times a day (QID) | RECTAL | Status: DC | PRN

## 2020-06-23 MED ORDER — LORAZEPAM 2 MG/ML IJ SOLN
1.0000 mg | Freq: Once | INTRAMUSCULAR | Status: AC
  Administered 2020-06-23: 1 mg via INTRAVENOUS
  Filled 2020-06-23: qty 1

## 2020-06-23 MED ORDER — POLYETHYLENE GLYCOL 3350 17 G PO PACK: 17.0000 g | PACK | Freq: Every day | ORAL | Status: DC | PRN

## 2020-06-23 MED ORDER — DICYCLOMINE HCL 10 MG/ML IM SOLN
20.0000 mg | Freq: Once | INTRAMUSCULAR | Status: AC
  Administered 2020-06-23: 20 mg via INTRAMUSCULAR
  Filled 2020-06-23: qty 2

## 2020-06-23 MED ORDER — LABETALOL HCL 5 MG/ML IV SOLN: 5.0000 mg | INTRAVENOUS | Status: DC | PRN

## 2020-06-23 MED ORDER — SODIUM CHLORIDE 0.9 % IV SOLN: INTRAVENOUS | Status: AC

## 2020-06-23 MED ORDER — PROCHLORPERAZINE EDISYLATE 10 MG/2ML IJ SOLN
10.0000 mg | Freq: Once | INTRAMUSCULAR | Status: AC
  Administered 2020-06-23: 10 mg via INTRAVENOUS
  Filled 2020-06-23: qty 2

## 2020-06-23 MED ORDER — INSULIN ASPART 100 UNIT/ML ~~LOC~~ SOLN
0.0000 [IU] | SUBCUTANEOUS | Status: DC
  Administered 2020-06-23: 5 [IU] via SUBCUTANEOUS
  Administered 2020-06-24 (×3): 3 [IU] via SUBCUTANEOUS
  Administered 2020-06-24 – 2020-06-25 (×2): 1 [IU] via SUBCUTANEOUS

## 2020-06-23 MED ORDER — DROPERIDOL 2.5 MG/ML IJ SOLN
1.2500 mg | Freq: Once | INTRAMUSCULAR | Status: AC
  Administered 2020-06-23: 1.25 mg via INTRAVENOUS
  Filled 2020-06-23: qty 2

## 2020-06-23 MED ORDER — SODIUM CHLORIDE 0.9 % IV SOLN: INTRAVENOUS | Status: DC

## 2020-06-23 MED ORDER — ENOXAPARIN SODIUM 40 MG/0.4ML ~~LOC~~ SOLN
40.0000 mg | SUBCUTANEOUS | Status: DC
  Administered 2020-06-23 – 2020-06-24 (×2): 40 mg via SUBCUTANEOUS
  Filled 2020-06-23 (×2): qty 0.4

## 2020-06-23 MED ORDER — FLEET ENEMA 7-19 GM/118ML RE ENEM: 1.0000 | ENEMA | Freq: Once | RECTAL | Status: DC | PRN

## 2020-06-23 NOTE — ED Triage Notes (Signed)
Pt here from home with emesis onset today. Pt states sees GI and has a CT scheduled for later this month. Pt states she has not been able to keep anything down today.

## 2020-06-23 NOTE — H&P (Addendum)
History and Physical    [name] GMW:[id number] DOB: [birth year] DOA: 06/23/2020  PCP: Anne Ng, NP   Patient coming from: Home   Chief Complaint: Nausea, vomiting   HPI: [name] is a 30 y.o. female with medical history significant for type 1 diabetes mellitus, hypertension, and constipation, now presenting to the emergency department for evaluation and nausea and vomiting.  Patient reports that she was in her usual state until this morning when she developed nausea.  Symptoms have worsened throughout the day with several episodes of nonbloody vomiting.  She has been unable to tolerate any thing by mouth.  She has mild generalized abdominal discomfort associated with this but denies any fevers or diarrhea.  She reports that her last bowel movement was a couple days ago.  She reports using suppositories as needed for constipation.  She is following with GI for frequent nausea and vomiting which was suspected to be related to constipation but she reports that symptoms do not seem to improve after doing a bowel cleanout.  She denies any chest pain, headaches, cough, or shortness of breath.  ED Course: Upon arrival to the ED, patient is found to be afebrile, saturating well on room air, tachycardic to 120, and hypertensive to 180/90.  Chemistry panel notable for glucose 358 with serum bicarbonate 20 and normal anion gap.  CBC is unremarkable.  Lipase was normal and pregnancy test negative.  Urinalysis notable for glucosuria, proteinuria, and ketonuria, but not suggestive of infection.  The patient was treated with 2 L of LR, Zofran, Compazine, droperidol, Bentyl, and Ativan in the ED but continues to complain of nausea and unable to tolerate anything by mouth.  COVID-19 screening test has not yet resulted.  Review of Systems:  All other systems reviewed and apart from HPI, are negative.  Past Medical History:  Diagnosis Date  . Asthma   . Diabetes mellitus    type 1   .  Essential hypertension, benign 02/10/2016    History reviewed. No pertinent surgical history.  Social History:   reports that she has been smoking cigarettes. She uses smokeless tobacco. She reports that she does not drink alcohol and does not use drugs.  Allergies  Allergen Reactions  . Aleve [Naproxen Sodium] Swelling    Family History  Problem Relation Age of Onset  . Hypertension Paternal Grandmother      Prior to Admission medications   Medication Sig Start Date End Date Taking? Authorizing Provider  Albuterol Sulfate (PROAIR RESPICLICK) 108 (90 Base) MCG/ACT AEPB Inhale 1-2 puffs into the lungs every 6 (six) hours as needed. 09/22/19   Nche, Bonna Gains, NP  benzonatate (TESSALON) 100 MG capsule Take 1-2 capsules (100-200 mg total) by mouth 3 (three) times daily as needed. 09/22/19   Nche, Bonna Gains, NP  cetirizine (ZYRTEC) 10 MG tablet Take 1 tablet (10 mg total) by mouth daily. 09/22/19   Nche, Bonna Gains, NP  clindamycin (CLEOCIN) 300 MG capsule Take 1 capsule (300 mg total) by mouth 3 (three) times daily. 06/15/20   Mardella Layman, MD  Continuous Blood Gluc Sensor (FREESTYLE LIBRE 14 DAY SENSOR) MISC by Other route every fourteen (14) days. One libre sensor every 14 days 01/21/18   [provider]  fluticasone (FLONASE) 50 MCG/ACT nasal spray Place 2 sprays into both nostrils daily. 09/22/19   Nche, Bonna Gains, NP  guaiFENesin-dextromethorphan (ROBITUSSIN DM) 100-10 MG/5ML syrup Take 15 mLs by mouth every 8 (eight) hours as needed for cough. 09/22/19  Nche, Bonna Gains, NP  HYDROcodone-acetaminophen (NORCO/VICODIN) 5-325 MG tablet Take 1 tablet by mouth every 6 (six) hours as needed for moderate pain or severe pain. 06/15/20   Mardella Layman, MD  Insulin Pen Needle (ULTRA-THIN II SHORT PEN NEEDLE) 31G X 8 MM MISC Inject into the skin. 01/21/18   [provider]  lisinopril (ZESTRIL) 10 MG tablet Take 20 mg by mouth daily. 09/09/19   [provider]   metoCLOPramide (REGLAN) 10 MG tablet Take 1 tablet (10 mg total) by mouth every 6 (six) hours for 10 doses. 05/11/20 05/14/20  Cheryll Cockayne, MD  NOVOLOG FLEXPEN 100 UNIT/ML FlexPen Inject 1-9 Units into the skin 3 (three) times daily with meals.  07/22/16   [provider]  ondansetron (ZOFRAN-ODT) 4 MG disintegrating tablet Take 1 tablet (4 mg total) by mouth every 8 (eight) hours as needed for nausea or vomiting. 06/05/17   Mardella Layman, MD  PARoxetine (PAXIL) 20 MG tablet Take by mouth. 05/02/20   [provider]  polyethylene glycol powder (GLYCOLAX/MIRALAX) 17 GM/SCOOP powder Take 17 g by mouth daily. 09/22/19   Nche, Bonna Gains, NP  TRESIBA FLEXTOUCH 100 UNIT/ML FlexTouch Pen  06/29/19   [provider]  ranitidine (ZANTAC) 75 MG tablet Take 75 mg by mouth daily as needed for heartburn.  06/15/20  [provider]    Physical Exam: Vitals:   06/23/20 1313 06/23/20 1632 06/23/20 1947  BP: (!) 180/104 (!) 177/89 (!) 183/90  Pulse: 94 97 (!) 120  Resp: 20 16 16   Temp: 97.6 F (36.4 C) 98.1 F (36.7 C)   TempSrc: Oral Oral   SpO2: 100% 100% 98%    Constitutional: NAD, calm  Eyes: PERTLA, lids and conjunctivae normal ENMT: Mucous membranes are moist. Posterior pharynx clear of any exudate or lesions.   Neck: normal, supple, no masses, no thyromegaly Respiratory: no wheezing, no crackles. No accessory muscle use.  Cardiovascular: S1 & S2 heard, regular rate and rhythm, hyperdynamic precordium. No extremity edema.   Abdomen: No distension, soft, no guarding or rebound pain. Bowel sounds present.  Musculoskeletal: no clubbing / cyanosis. No joint deformity upper and lower extremities.   Skin: no significant rashes, lesions, ulcers. Warm, dry, well-perfused. Neurologic: CN 2-12 grossly intact. Sensation intact. Moving all extremities.  Psychiatric: Alert and oriented to person, place, and situation. Calm and cooperative.    Labs and Imaging on  Admission: I have personally reviewed following labs and imaging studies  CBC: Recent Labs  Lab 06/23/20 1314 06/23/20 2029  WBC 8.1  --   HGB 12.6 12.2  HCT 39.0 36.0  MCV 91.8  --   PLT 360  --    Basic Metabolic Panel: Recent Labs  Lab 06/23/20 1314 06/23/20 2029  NA 134* 136  K 4.8 4.5  CL 103 103  CO2 20*  --   GLUCOSE 358* 330*  BUN 14 16  CREATININE 0.96 0.60  CALCIUM 10.1  --    GFR: CrCl cannot be calculated (Unknown ideal weight.). Liver Function Tests: Recent Labs  Lab 06/23/20 1314  AST 18  ALT 12  ALKPHOS 77  BILITOT 0.8  PROT 7.8  ALBUMIN 3.9   Recent Labs  Lab 06/23/20 1314  LIPASE 29   No results for input(s): AMMONIA in the last 168 hours. Coagulation Profile: No results for input(s): INR, PROTIME in the last 168 hours. Cardiac Enzymes: No results for input(s): CKTOTAL, CKMB, CKMBINDEX, TROPONINI in the last 168 hours. BNP (last 3  results) No results for input(s): PROBNP in the last 8760 hours. HbA1C: No results for input(s): HGBA1C in the last 72 hours. CBG: Recent Labs  Lab 06/23/20 1315  GLUCAP 333*   Lipid Profile: No results for input(s): CHOL, HDL, LDLCALC, TRIG, CHOLHDL, LDLDIRECT in the last 72 hours. Thyroid Function Tests: No results for input(s): TSH, T4TOTAL, FREET4, T3FREE, THYROIDAB in the last 72 hours. Anemia Panel: No results for input(s): VITAMINB12, FOLATE, FERRITIN, TIBC, IRON, RETICCTPCT in the last 72 hours. Urine analysis:    Component Value Date/Time   COLORURINE STRAW (A) 06/23/2020 1553   APPEARANCEUR HAZY (A) 06/23/2020 1553   LABSPEC 1.016 06/23/2020 1553   PHURINE 7.0 06/23/2020 1553   GLUCOSEU >=500 (A) 06/23/2020 1553   HGBUR SMALL (A) 06/23/2020 1553   BILIRUBINUR NEGATIVE 06/23/2020 1553   BILIRUBINUR Negative 11/12/2012 1238   KETONESUR 20 (A) 06/23/2020 1553   PROTEINUR 100 (A) 06/23/2020 1553   UROBILINOGEN 0.2 06/05/2017 1111   NITRITE NEGATIVE 06/23/2020 1553   LEUKOCYTESUR NEGATIVE  06/23/2020 1553   Sepsis Labs: @LABRCNTIP (procalcitonin:4,lacticidven:4) )No results found for this or any previous visit (from the past 240 hour(s)).   Radiological Exams on Admission: No results found.  Assessment/Plan   1. Intractable N/V  - Presents with one day of recurrent N/V, unable to tolerate any PO today, no fever/chills or diarrhea, had BM two days ago  - She reports following with GI at [facility], was suspected that frequent N/V d/t constipation but she reports no change after bowel clean-out  - She was treated aggressively in ED with Zofran, compazine, droperidol, Bentyl, and Ativan but continues to report nausea and inability to tolerate any PO  - Continue IVF hydration, check abdominal radiographs, check EKG for QT interval, trial scheduled Reglan, continue prn antiemetics, monitor electrolytes   2. Type I DM  - A1c was 9.2% in September 2021, had been 12.5% in 2019  - Serum glucose 358 on admission without DKA  - Continue CBG checks, insulin    3. Constipation  - Patient reports following with GI at [facility] for chronic constipation, had normal BM 2 days ago, has only been using laxatives prn  - Checking abd radiographs   4. Hypertension  - BP elevated in ED - Uses IV labetalol as needed until she is able to tolerate pills    DVT prophylaxis: Lovenox  Code Status: Full  Level of Care: Level of care: Telemetry Medical Family Communication: None present  Disposition Plan:  Patient is from: Home  Anticipated d/c is to: Home Anticipated d/c date is: 06/24/20 Patient currently: Pending control of N/V  Consults called: None  Admission status: Observation    08/24/20, MD [facility]  06/23/2020, 9:35 PM

## 2020-06-23 NOTE — ED Notes (Signed)
Patient transported to X-ray 

## 2020-06-23 NOTE — ED Provider Notes (Signed)
[facility] EMERGENCY DEPARTMENT Provider Note   CSN: [id number] Arrival date & time: 06/23/20  1250     History Chief Complaint  Patient presents with  . Emesis    [name] is a 30 y.o. female.    Patient with type 1 diabetes, gastroparesis presents with nausea, vomiting, abdominal pain. She notes recent evaluations and admissions at this and other hospitals.  However, she is well until this morning.  Without clear precipitant, about 6 hours prior to ED arrival she developed anorexia, nausea.  Since that time she has had persistent episodes of vomiting, ongoing intermittent severe crampy abdominal pain.  No diarrhea, no change in bowel movement. She has been intolerant of her medications. Beyond diabetes, gastroparesis she denies other substantial medical problems.  She does follow-up with GI, and is scheduled for evaluation later this month.   Past Medical History:  Diagnosis Date  . Asthma   . Diabetes mellitus    type 1   . Essential hypertension, benign 02/10/2016    Patient Active Problem List   Diagnosis Date Noted  . Low grade squamous intraepith lesion on cytologic smear cervix (lgsil) 09/22/2019  . Constipation 09/06/2019  . Essential hypertension, benign 02/10/2016  . Generalized abdominal pain   . Abdominal pain, epigastric 04/10/2013  . Non-intractable vomiting 04/10/2013  . Menorrhagia with irregular cycle 11/26/2012  . Encounter for Norplant removal 11/26/2012  . Hyperglycemia 11/13/2012  . Type 1 diabetes mellitus (HCC) 11/13/2012  . Subclinical hyperthyroidism 07/21/2012  . Vitamin D deficiency 12/31/2011  . Exercise-induced asthma 10/16/2011  . DKA (diabetic ketoacidoses) 04/29/2011  . Noncompliance 04/29/2011  . Other chronic cystitis 10/13/2009  . Primary hypercoagulable state (HCC) 10/13/2009    No past surgical history on file.   OB History   No obstetric history on file.     Family History  Problem Relation  Age of Onset  . Hypertension Paternal Grandmother     Social History   Tobacco Use  . Smoking status: Current Some Day Smoker    Types: Cigarettes  . Smokeless tobacco: Current User  Substance Use Topics  . Alcohol use: No  . Drug use: No    Home Medications Prior to Admission medications   Medication Sig Start Date End Date Taking? Authorizing Provider  Albuterol Sulfate (PROAIR RESPICLICK) 108 (90 Base) MCG/ACT AEPB Inhale 1-2 puffs into the lungs every 6 (six) hours as needed. 09/22/19   Nche, Bonna Gains, NP  benzonatate (TESSALON) 100 MG capsule Take 1-2 capsules (100-200 mg total) by mouth 3 (three) times daily as needed. 09/22/19   Nche, Bonna Gains, NP  cetirizine (ZYRTEC) 10 MG tablet Take 1 tablet (10 mg total) by mouth daily. 09/22/19   Nche, Bonna Gains, NP  clindamycin (CLEOCIN) 300 MG capsule Take 1 capsule (300 mg total) by mouth 3 (three) times daily. 06/15/20   Mardella Layman, MD  Continuous Blood Gluc Sensor (FREESTYLE LIBRE 14 DAY SENSOR) MISC by Other route every fourteen (14) days. One libre sensor every 14 days 01/21/18   [provider]  fluticasone (FLONASE) 50 MCG/ACT nasal spray Place 2 sprays into both nostrils daily. 09/22/19   Nche, Bonna Gains, NP  guaiFENesin-dextromethorphan (ROBITUSSIN DM) 100-10 MG/5ML syrup Take 15 mLs by mouth every 8 (eight) hours as needed for cough. 09/22/19   Nche, Bonna Gains, NP  HYDROcodone-acetaminophen (NORCO/VICODIN) 5-325 MG tablet Take 1 tablet by mouth every 6 (six) hours as needed for moderate pain or severe pain. 06/15/20   Hagler,  Arlys John, MD  Insulin Pen Needle (ULTRA-THIN II SHORT PEN NEEDLE) 31G X 8 MM MISC Inject into the skin. 01/21/18   [provider]  lisinopril (ZESTRIL) 10 MG tablet Take 20 mg by mouth daily. 09/09/19   [provider]  metoCLOPramide (REGLAN) 10 MG tablet Take 1 tablet (10 mg total) by mouth every 6 (six) hours for 10 doses. 05/11/20 05/14/20  Cheryll Cockayne, MD  NOVOLOG  FLEXPEN 100 UNIT/ML FlexPen Inject 1-9 Units into the skin 3 (three) times daily with meals.  07/22/16   [provider]  ondansetron (ZOFRAN-ODT) 4 MG disintegrating tablet Take 1 tablet (4 mg total) by mouth every 8 (eight) hours as needed for nausea or vomiting. 06/05/17   Mardella Layman, MD  PARoxetine (PAXIL) 20 MG tablet Take by mouth. 05/02/20   [provider]  polyethylene glycol powder (GLYCOLAX/MIRALAX) 17 GM/SCOOP powder Take 17 g by mouth daily. 09/22/19   Nche, Bonna Gains, NP  TRESIBA FLEXTOUCH 100 UNIT/ML FlexTouch Pen  06/29/19   [provider]  ranitidine (ZANTAC) 75 MG tablet Take 75 mg by mouth daily as needed for heartburn.  06/15/20  [provider]    Allergies    Aleve [naproxen sodium]  Review of Systems   Review of Systems  Constitutional:       Per HPI, otherwise negative  HENT:       Per HPI, otherwise negative  Respiratory:       Per HPI, otherwise negative  Cardiovascular:       Per HPI, otherwise negative  Gastrointestinal: Positive for abdominal pain, nausea and vomiting.  Endocrine:       Negative aside from HPI  Genitourinary:       Neg aside from HPI   Musculoskeletal:       Per HPI, otherwise negative  Skin: Negative.   Neurological: Positive for weakness. Negative for syncope.    Physical Exam Updated Vital Signs BP (!) 177/89 (BP Location: Left Arm)   Pulse 97   Temp 98.1 F (36.7 C) (Oral)   Resp 16   SpO2 100%   Physical Exam Vitals and nursing note reviewed.  Constitutional:      General: She is not in acute distress.    Appearance: She is well-developed.  HENT:     Head: Normocephalic and atraumatic.  Eyes:     Conjunctiva/sclera: Conjunctivae normal.  Cardiovascular:     Rate and Rhythm: Regular rhythm. Tachycardia present.  Pulmonary:     Effort: Pulmonary effort is normal. No respiratory distress.     Breath sounds: Normal breath sounds. No stridor.  Abdominal:     General: There is no  distension.     Comments: Nonperitoneal abdomen, with some tenderness about the periumbilical region, otherwise unremarkable.  Skin:    General: Skin is warm and dry.  Neurological:     Mental Status: She is alert and oriented to person, place, and time.     Cranial Nerves: No cranial nerve deficit.      ED Results / Procedures / Treatments   Labs (all labs ordered are listed, but only abnormal results are displayed) Labs Reviewed  COMPREHENSIVE METABOLIC PANEL - Abnormal; Notable for the following components:      Result Value   Sodium 134 (*)    CO2 20 (*)    Glucose, Bld 358 (*)    All other components within normal limits  URINALYSIS, ROUTINE W REFLEX MICROSCOPIC - Abnormal; Notable for the following components:  Color, Urine STRAW (*)    APPearance HAZY (*)    Glucose, UA >=500 (*)    Hgb urine dipstick SMALL (*)    Ketones, ur 20 (*)    Protein, ur 100 (*)    Bacteria, UA RARE (*)    All other components within normal limits  CBG MONITORING, ED - Abnormal; Notable for the following components:   Glucose-Capillary 333 (*)    All other components within normal limits  LIPASE, BLOOD  CBC  I-STAT BETA HCG BLOOD, ED ([facility], [facility], [facility] ONLY)    EKG None  Radiology No results found.  Procedures Procedures   Medications Ordered in ED Medications  ondansetron (ZOFRAN-ODT) disintegrating tablet 4 mg (4 mg Oral Given 06/23/20 1315)  lactated ringers bolus 1,000 mL (1,000 mLs Intravenous New Bag/Given 06/23/20 1710)  LORazepam (ATIVAN) injection 1 mg (1 mg Intravenous Given 06/23/20 1657)  dicyclomine (BENTYL) injection 20 mg (20 mg Intramuscular Given 06/23/20 1712)  lactated ringers bolus 1,000 mL (1,000 mLs Intravenous New Bag/Given 06/23/20 1703)  droperidol (INAPSINE) 2.5 MG/ML injection 1.25 mg (1.25 mg Intravenous Given 06/23/20 1711)    ED Course  I have reviewed the triage vital signs and the nursing notes.  Pertinent labs & imaging results that were available  during my care of the patient were reviewed by me and considered in my medical decision making (see chart for details).     5:38 PM Patient sleeping  Now after 3 L fluid resuscitation, Ativan, though, droperidol, patient continues to complain of nausea, vomiting. Abdomen remains soft, nonperitoneal.  Young female, type I diabetic, with gastroparesis presents with persistent nausea, vomiting, p.o. intolerance. Here patient is awake and alert, neurologically unremarkable, has mild tachycardia, consistent with dehydration, labs generally noncontributory, she is hyperglycemic, though with no evidence for DKA.  Given the patient's persistent p.o. intolerance required admission for further monitoring, management, resuscitation. MDM Rules/Calculators/A&P MDM Number of Diagnoses or Management Options Hyperglycemia: established, worsening Intractable vomiting with nausea: established, worsening   Amount and/or Complexity of Data Reviewed Clinical lab tests: reviewed Tests in the medicine section of CPT: reviewed Decide to obtain previous medical records or to obtain history from someone other than the patient: yes Review and summarize past medical records: yes Discuss the patient with other providers: yes Independent visualization of images, tracings, or specimens: yes  Risk of Complications, Morbidity, and/or Mortality Presenting problems: high Diagnostic procedures: high Management options: high  Critical Care Total time providing critical care: < 30 minutes  Patient Progress Patient progress: stable   Final Clinical Impression(s) / ED Diagnoses Final diagnoses:  Intractable vomiting with nausea  Hyperglycemia     Gerhard Munch, MD 06/23/20 2325

## 2020-06-24 DIAGNOSIS — R739 Hyperglycemia, unspecified: Secondary | ICD-10-CM | POA: Diagnosis not present

## 2020-06-24 DIAGNOSIS — R112 Nausea with vomiting, unspecified: Secondary | ICD-10-CM | POA: Diagnosis not present

## 2020-06-24 LAB — BASIC METABOLIC PANEL
Anion gap: 12 (ref 5–15)
BUN: 15 mg/dL (ref 6–20)
CO2: 18 mmol/L — ABNORMAL LOW (ref 22–32)
Calcium: 9.5 mg/dL (ref 8.9–10.3)
Chloride: 109 mmol/L (ref 98–111)
Creatinine, Ser: 0.81 mg/dL (ref 0.44–1.00)
GFR, Estimated: >60 mL/min (ref >60)
Glucose, Bld: 149 mg/dL — ABNORMAL HIGH (ref 70–99)
Potassium: 3.9 mmol/L (ref 3.5–5.1)
Sodium: 139 mmol/L (ref 135–145)

## 2020-06-24 LAB — CBG MONITORING, ED
Glucose-Capillary: 148 mg/dL — ABNORMAL HIGH (ref 70–99)
Glucose-Capillary: 144 mg/dL — ABNORMAL HIGH (ref 70–99)
Glucose-Capillary: 168 mg/dL — ABNORMAL HIGH (ref 70–99)

## 2020-06-24 LAB — SARS CORONAVIRUS 2 (TAT 6-24 HRS): SARS Coronavirus 2: NEGATIVE

## 2020-06-24 LAB — CBC
HCT: 35 % — ABNORMAL LOW (ref 36.0–46.0)
Hemoglobin: 11.7 g/dL — ABNORMAL LOW (ref 12.0–15.0)
MCH: 30.2 pg (ref 26.0–34.0)
MCHC: 33.4 g/dL (ref 30.0–36.0)
MCV: 90.2 fL (ref 80.0–100.0)
Platelets: 338 10*3/uL (ref 150–400)
RBC: 3.88 MIL/uL (ref 3.87–5.11)
RDW: 12.2 % (ref 11.5–15.5)
WBC: 11.2 10*3/uL — ABNORMAL HIGH (ref 4.0–10.5)
nRBC: 0.3 % — ABNORMAL HIGH (ref 0.0–0.2)

## 2020-06-24 LAB — GLUCOSE, CAPILLARY
Glucose-Capillary: 117 mg/dL — ABNORMAL HIGH (ref 70–99)
Glucose-Capillary: 210 mg/dL — ABNORMAL HIGH (ref 70–99)

## 2020-06-24 LAB — HEMOGLOBIN A1C
Hgb A1c MFr Bld: 11.4 % — ABNORMAL HIGH (ref 4.8–5.6)
Mean Plasma Glucose: 280.48 mg/dL

## 2020-06-24 LAB — HIV ANTIBODY (ROUTINE TESTING W REFLEX): HIV Screen 4th Generation wRfx: NONREACTIVE

## 2020-06-24 NOTE — ED Notes (Signed)
Vomited approx 50cc thin bile colored

## 2020-06-24 NOTE — ED Notes (Signed)
Transferred to 5C-10 via transport svc

## 2020-06-24 NOTE — Progress Notes (Signed)
[facility]  PROGRESS NOTE  [name] XBJ:[id number] DOB: 02/25/1991 DOA: 06/23/2020 PCP: Anne Ng, NP   Brief HPI:   30 year old female with medical history of type I diabetes mellitus type, hypertension, constipation came to ED for evaluation of nausea and vomiting.  As per patient she has been on Reglan for a long time and was recently stopped by her gastroenterologist.  Patient was treated with Zofran, Compazine, droperidol, Bentyl, Ativan.    Subjective   Patient seen and examined, denies vomiting.  Wants to try clear liquid diet.   Assessment/Plan:     1. Intractable nausea vomiting, likely from underlying gastroparesis.  She has been on Reglan for long time which was recently stopped by her gastroenterologist.  Nausea vomiting seems to have improved with Reglan.  Likely will need Reglan at discharge. 2. Diabetes mellitus type 1-hemoglobin A1c was 9.2% in September 2021.  CBG well controlled, continue sliding scale insulin NovoLog.  Lantus 10 units subcu daily. 3. Constipation-patient had normal BM 2 days ago, has been using laxative as needed.  Abdominal x-ray was unremarkable. 4. Hypertension-blood pressure is mildly better, continue as needed IV labetalol     COVID-19 Labs  No results for input(s): DDIMER, FERRITIN, LDH, CRP in the last 72 hours.  Lab Results  Component Value Date   SARSCOV2NAA NEGATIVE 06/23/2020   SARSCOV2NAA Not Detected 02/17/2019     Scheduled medications:   . enoxaparin (LOVENOX) injection  40 mg Subcutaneous Q24H  . insulin aspart  0-9 Units Subcutaneous Q4H  . insulin glargine  10 Units Subcutaneous QHS  . metoCLOPramide (REGLAN) injection  10 mg Intravenous Q8H         CBG: Recent Labs  Lab 06/23/20 2356 06/24/20 0355 06/24/20 0943 06/24/20 1115 06/24/20 1618  GLUCAP 243* 148* 144* 168* 117*    SpO2: 100 %    CBC: Recent Labs  Lab 06/23/20 1314 06/23/20 2029 06/24/20 0428  WBC 8.1  --  11.2*   HGB 12.6 12.2 11.7*  HCT 39.0 36.0 35.0*  MCV 91.8  --  90.2  PLT 360  --  338    Basic Metabolic Panel: Recent Labs  Lab 06/23/20 1314 06/23/20 2029 06/24/20 0428  NA 134* 136 139  K 4.8 4.5 3.9  CL 103 103 109  CO2 20*  --  18*  GLUCOSE 358* 330* 149*  BUN 14 16 15   CREATININE 0.96 0.60 0.81  CALCIUM 10.1  --  9.5     Liver Function Tests: Recent Labs  Lab 06/23/20 1314  AST 18  ALT 12  ALKPHOS 77  BILITOT 0.8  PROT 7.8  ALBUMIN 3.9     Antibiotics: Anti-infectives (From admission, onward)   None       DVT prophylaxis: Lovenox  Code Status: Full code  Family Communication: No family at bedside   Consultants:    Procedures:      Objective   Vitals:   06/24/20 0600 06/24/20 1000 06/24/20 1501 06/24/20 1558  BP: (!) 176/95 (!) 179/93 (!) 146/97 (!) 139/98  Pulse: (!) 113 (!) 108 100 97  Resp: 19 18 18 18   Temp:   99.3 F (37.4 C) 99 F (37.2 C)  TempSrc:   Oral Oral  SpO2: 98% 100% 100% 100%    Intake/Output Summary (Last 24 hours) at 06/24/2020 1658 Last data filed at 06/23/2020 1949 Gross per 24 hour  Intake 3000 ml  Output --  Net 3000 ml    03/09 1901 - 03/11  0700 In: 3000  Out: -   There were no vitals filed for this visit.  Physical Examination:   General-appears in no acute distress Heart-S1-S2, regular, no murmur auscultated Lungs-clear to auscultation bilaterally, no wheezing or crackles auscultated Abdomen-soft, nontender, no organomegaly Extremities-no edema in the lower extremities Neuro-alert, oriented x3, no focal deficit noted  Status is: Inpatient  Dispo: The patient is from: Home              Anticipated d/c is to: Home              Anticipated d/c date is: 06/25/2020              Patient currently not stable for discharge  Barrier to discharge-intractable nausea vomiting, started on clear liquid diet            Data Reviewed:   Recent Results (from the past 240 hour(s))  SARS  CORONAVIRUS 2 (TAT 6-24 HRS) Nasopharyngeal Nasopharyngeal Swab     Status: None   Collection Time: 06/23/20  8:30 PM   Specimen: Nasopharyngeal Swab  Result Value Ref Range Status   SARS Coronavirus 2 NEGATIVE NEGATIVE Final    Comment: (NOTE) SARS-CoV-2 target nucleic acids are NOT DETECTED.  The SARS-CoV-2 RNA is generally detectable in upper and lower respiratory specimens during the acute phase of infection. Negative results do not preclude SARS-CoV-2 infection, do not rule out co-infections with other pathogens, and should not be used as the sole basis for treatment or other patient management decisions. Negative results must be combined with clinical observations, patient history, and epidemiological information. The expected result is Negative.  Fact Sheet for Patients: HairSlick.no  Fact Sheet for Healthcare Providers: quierodirigir.com  This test is not yet approved or cleared by the [place] FDA and  has been authorized for detection and/or diagnosis of SARS-CoV-2 by FDA under an Emergency Use Authorization (EUA). This EUA will remain  in effect (meaning this test can be used) for the duration of the COVID-19 declaration under Se ction 564(b)(1) of the Act, 21 U.S.C. section 360bbb-3(b)(1), unless the authorization is terminated or revoked sooner.  Performed at [facility], [address], [place], [place] [address]     Recent Labs  Lab 06/23/20 1314  LIPASE 29   No results for input(s): AMMONIA in the last 168 hours.  Cardiac Enzymes: No results for input(s): CKTOTAL, CKMB, CKMBINDEX, TROPONINI in the last 168 hours. BNP (last 3 results) No results for input(s): BNP in the last 8760 hours.  ProBNP (last 3 results) No results for input(s): PROBNP in the last 8760 hours.  Studies:  DG Abd Portable 1V  Result Date: 06/23/2020 CLINICAL DATA:  Intractable nausea and vomiting. EXAM: PORTABLE  ABDOMEN - 1 VIEW COMPARISON:  CT 10/29/2014 FINDINGS: No evidence of bowel obstruction. Formed stool in the right colon. Otherwise paucity of bowel gas. No radiopaque calculi or abnormal soft tissue calcifications. No osseous abnormalities are seen. IMPRESSION: Generalized paucity of small bowel gas is nonspecific. There is no evidence of obstruction. Electronically Signed   By: Narda Rutherford M.D.   On: 06/23/2020 22:40       Jyren Cerasoli S Lamoine Magallon   [facility] If 7PM-7AM, please contact night-coverage at [email or web address], Office  [phone]   06/24/2020, 4:58 PM  LOS: 0 days

## 2020-06-24 NOTE — ED Notes (Signed)
Lunch Tray Ordered @ 1001. 

## 2020-06-25 DIAGNOSIS — R112 Nausea with vomiting, unspecified: Secondary | ICD-10-CM | POA: Diagnosis not present

## 2020-06-25 LAB — BASIC METABOLIC PANEL
Anion gap: 5 (ref 5–15)
BUN: 10 mg/dL (ref 6–20)
CO2: 24 mmol/L (ref 22–32)
Calcium: 9.1 mg/dL (ref 8.9–10.3)
Chloride: 107 mmol/L (ref 98–111)
Creatinine, Ser: 0.85 mg/dL (ref 0.44–1.00)
GFR, Estimated: >60 mL/min (ref >60)
Glucose, Bld: 134 mg/dL — ABNORMAL HIGH (ref 70–99)
Potassium: 4 mmol/L (ref 3.5–5.1)
Sodium: 136 mmol/L (ref 135–145)

## 2020-06-25 LAB — GLUCOSE, CAPILLARY
Glucose-Capillary: 118 mg/dL — ABNORMAL HIGH (ref 70–99)
Glucose-Capillary: 126 mg/dL — ABNORMAL HIGH (ref 70–99)
Glucose-Capillary: 126 mg/dL — ABNORMAL HIGH (ref 70–99)
Glucose-Capillary: 64 mg/dL — ABNORMAL LOW (ref 70–99)

## 2020-06-25 NOTE — Discharge Summary (Signed)
Physician Discharge Summary  [name] WEX:[id number] DOB: February 16, 1991 DOA: 06/23/2020  PCP: Anne Ng, NP  Admit date: 06/23/2020 Discharge date: 06/25/2020  Admitted From: Home Disposition: Home  Recommendations for Outpatient Follow-up:  1. Follow up with PCP in 1-2 weeks 2. Please obtain BMP/CBC in one week 3. Follow-up with the gastroenterologist as a scheduled.  Home Health: Not needed Equipment/Devices: Not needed  Discharge Condition: Stable CODE STATUS: Full code Diet recommendation: Low-carb diet.  Small frequent meals.  Gastroparesis female.  Discharge summary: Type 1 diabetes, recent known A1c of 9 presented with recurrent episodes of epigastric pain and nausea and intolerance to diet.  She was also constipated.  Admitted to hospital with gastroparesis, treated with IV fluids, bowel regimen and now with normal bowel function.  She has not required any nausea medicine for last 24 hours. Plan: Patient is medically stable.  Tolerating regular diet.  Gastroparesis diet education was given.  She can use metoclopramide as needed.  She is scheduled to follow-up with gastroenterology next week for further work-up that she will keep up.  Patient is going back on her insulin.   Discharge Diagnoses:  Principal Problem:   Intractable nausea and vomiting Active Problems:   Type 1 diabetes mellitus (HCC)   Constipation    Discharge Instructions  Discharge Instructions    Diet Carb Modified   Complete by: As directed    Discharge instructions   Complete by: As directed    Small frequent meals   Increase activity slowly   Complete by: As directed      Allergies as of 06/25/2020      Reactions   Aleve [naproxen Sodium] Swelling      Medication List    STOP taking these medications   benzonatate 100 MG capsule Commonly known as: TESSALON   clindamycin 300 MG capsule Commonly known as: CLEOCIN   guaiFENesin-dextromethorphan 100-10 MG/5ML syrup Commonly  known as: ROBITUSSIN DM   HYDROcodone-acetaminophen 5-325 MG tablet Commonly known as: NORCO/VICODIN   [place] FlexTouch 100 UNIT/ML FlexTouch Pen Generic drug: insulin degludec     TAKE these medications   cetirizine 10 MG tablet Commonly known as: ZYRTEC Take 1 tablet (10 mg total) by mouth daily.   fluticasone 50 MCG/ACT nasal spray Commonly known as: FLONASE Place 2 sprays into both nostrils daily.   FreeStyle Libre 14 Day Sensor Misc by Other route every fourteen (14) days. One libre sensor every 14 days   lisinopril 10 MG tablet Commonly known as: ZESTRIL Take 20 mg by mouth daily.   metoCLOPramide 10 MG tablet Commonly known as: REGLAN Take 1 tablet (10 mg total) by mouth every 6 (six) hours for 10 doses.   NovoLOG FlexPen 100 UNIT/ML FlexPen Generic drug: insulin aspart Inject 1-9 Units into the skin 3 (three) times daily with meals.   ondansetron 4 MG disintegrating tablet Commonly known as: ZOFRAN-ODT Take 1 tablet (4 mg total) by mouth every 8 (eight) hours as needed for nausea or vomiting.   polyethylene glycol powder 17 GM/SCOOP powder Commonly known as: GLYCOLAX/MIRALAX Take 17 g by mouth daily.   ProAir RespiClick 108 (90 Base) MCG/ACT Aepb Generic drug: Albuterol Sulfate Inhale 1-2 puffs into the lungs every 6 (six) hours as needed.   ULTRA-THIN II SHORT PEN NEEDLE 31G X 8 MM Misc Generic drug: Insulin Pen Needle Inject into the skin.       Follow-up Information    Nche, Bonna Gains, NP Follow up in 1 week(s).   Specialty:  Internal Medicine Contact information: [address] [place] [address] [phone]              Allergies  Allergen Reactions   Aleve [Naproxen Sodium] Swelling    Consultations:  None   Procedures/Studies: DG Abd Portable 1V  Result Date: 06/23/2020 CLINICAL DATA:  Intractable nausea and vomiting. EXAM: PORTABLE ABDOMEN - 1 VIEW COMPARISON:  CT 10/29/2014 FINDINGS: No evidence of bowel  obstruction. Formed stool in the right colon. Otherwise paucity of bowel gas. No radiopaque calculi or abnormal soft tissue calcifications. No osseous abnormalities are seen. IMPRESSION: Generalized paucity of small bowel gas is nonspecific. There is no evidence of obstruction. Electronically Signed   By: Narda Rutherford M.D.   On: 06/23/2020 22:40   (Echo, Carotid, EGD, Colonoscopy, ERCP)    Subjective: Patient seen and examined.  Denies any complaints.  She wanted to try regular diet before discharge.  She was able to tolerate regular diet and planning to go home today.   Discharge Exam: Vitals:   06/25/20 0435 06/25/20 1210  BP: (!) 133/91 (!) 150/97  Pulse: 90 89  Resp: 18 16  Temp: 98.4 F (36.9 C) 98.9 F (37.2 C)  SpO2: 100% 100%   Vitals:   06/25/20 0013 06/25/20 0300 06/25/20 0435 06/25/20 1210  BP: (!) 127/91 (!) 133/91 (!) 133/91 (!) 150/97  Pulse: 84 90 90 89  Resp: 18 18 18 16   Temp: 98.9 F (37.2 C) 98.4 F (36.9 C) 98.4 F (36.9 C) 98.9 F (37.2 C)  TempSrc: Oral Oral Oral Oral  SpO2: 100% 100% 100% 100%  Weight:   54 kg     General: Pt is alert, awake, not in acute distress Cardiovascular: RRR, S1/S2 +, no rubs, no gallops Respiratory: CTA bilaterally, no wheezing, no rhonchi Abdominal: Soft, NT, ND, bowel sounds + Extremities: no edema, no cyanosis    The results of significant diagnostics from this hospitalization (including imaging, microbiology, ancillary and laboratory) are listed below for reference.     Microbiology: Recent Results (from the past 240 hour(s))  SARS CORONAVIRUS 2 (TAT 6-24 HRS) Nasopharyngeal Nasopharyngeal Swab     Status: None   Collection Time: 06/23/20  8:30 PM   Specimen: Nasopharyngeal Swab  Result Value Ref Range Status   SARS Coronavirus 2 NEGATIVE NEGATIVE Final    Comment: (NOTE) SARS-CoV-2 target nucleic acids are NOT DETECTED.  The SARS-CoV-2 RNA is generally detectable in upper and lower respiratory specimens  during the acute phase of infection. Negative results do not preclude SARS-CoV-2 infection, do not rule out co-infections with other pathogens, and should not be used as the sole basis for treatment or other patient management decisions. Negative results must be combined with clinical observations, patient history, and epidemiological information. The expected result is Negative.  Fact Sheet for Patients: 08/23/20  Fact Sheet for Healthcare Providers: HairSlick.no  This test is not yet approved or cleared by the [place] FDA and  has been authorized for detection and/or diagnosis of SARS-CoV-2 by FDA under an Emergency Use Authorization (EUA). This EUA will remain  in effect (meaning this test can be used) for the duration of the COVID-19 declaration under Se ction 564(b)(1) of the Act, 21 U.S.C. section 360bbb-3(b)(1), unless the authorization is terminated or revoked sooner.  Performed at [facility], [address] [place], [place]      Labs: BNP (last 3 results) No results for input(s): BNP in the last 8760 hours. Basic Metabolic  Panel: Recent Labs  Lab 06/23/20 1314 06/23/20 2029 06/24/20 0428 06/25/20 0607  NA 134* 136 139 136  K 4.8 4.5 3.9 4.0  CL 103 103 109 107  CO2 20*  --  18* 24  GLUCOSE 358* 330* 149* 134*  BUN 14 16 15 10   CREATININE 0.96 0.60 0.81 0.85  CALCIUM 10.1  --  9.5 9.1   Liver Function Tests: Recent Labs  Lab 06/23/20 1314  AST 18  ALT 12  ALKPHOS 77  BILITOT 0.8  PROT 7.8  ALBUMIN 3.9   Recent Labs  Lab 06/23/20 1314  LIPASE 29   No results for input(s): AMMONIA in the last 168 hours. CBC: Recent Labs  Lab 06/23/20 1314 06/23/20 2029 06/24/20 0428  WBC 8.1  --  11.2*  HGB 12.6 12.2 11.7*  HCT 39.0 36.0 35.0*  MCV 91.8  --  90.2  PLT 360  --  338   Cardiac Enzymes: No results for input(s): CKTOTAL, CKMB, CKMBINDEX, TROPONINI in the  last 168 hours. BNP: Invalid input(s): POCBNP CBG: Recent Labs  Lab 06/24/20 2010 06/25/20 0010 06/25/20 0426 06/25/20 0621 06/25/20 1105  GLUCAP 210* 126* 64* 126* 118*   D-Dimer No results for input(s): DDIMER in the last 72 hours. Hgb A1c Recent Labs    06/24/20 0428  HGBA1C 11.4*   Lipid Profile No results for input(s): CHOL, HDL, LDLCALC, TRIG, CHOLHDL, LDLDIRECT in the last 72 hours. Thyroid function studies No results for input(s): TSH, T4TOTAL, T3FREE, THYROIDAB in the last 72 hours.  Invalid input(s): FREET3 Anemia work up No results for input(s): VITAMINB12, FOLATE, FERRITIN, TIBC, IRON, RETICCTPCT in the last 72 hours. Urinalysis    Component Value Date/Time   COLORURINE STRAW (A) 06/23/2020 1553   APPEARANCEUR HAZY (A) 06/23/2020 1553   LABSPEC 1.016 06/23/2020 1553   PHURINE 7.0 06/23/2020 1553   GLUCOSEU >=500 (A) 06/23/2020 1553   HGBUR SMALL (A) 06/23/2020 1553   BILIRUBINUR NEGATIVE 06/23/2020 1553   BILIRUBINUR Negative 11/12/2012 1238   KETONESUR 20 (A) 06/23/2020 1553   PROTEINUR 100 (A) 06/23/2020 1553   UROBILINOGEN 0.2 06/05/2017 1111   NITRITE NEGATIVE 06/23/2020 1553   LEUKOCYTESUR NEGATIVE 06/23/2020 1553   Sepsis Labs Invalid input(s): PROCALCITONIN,  WBC,  LACTICIDVEN Microbiology Recent Results (from the past 240 hour(s))  SARS CORONAVIRUS 2 (TAT 6-24 HRS) Nasopharyngeal Nasopharyngeal Swab     Status: None   Collection Time: 06/23/20  8:30 PM   Specimen: Nasopharyngeal Swab  Result Value Ref Range Status   SARS Coronavirus 2 NEGATIVE NEGATIVE Final    Comment: (NOTE) SARS-CoV-2 target nucleic acids are NOT DETECTED.  The SARS-CoV-2 RNA is generally detectable in upper and lower respiratory specimens during the acute phase of infection. Negative results do not preclude SARS-CoV-2 infection, do not rule out co-infections with other pathogens, and should not be used as the sole basis for treatment or other patient management  decisions. Negative results must be combined with clinical observations, patient history, and epidemiological information. The expected result is Negative.  Fact Sheet for Patients: HairSlick.nohttps://www.fda.gov/media/138098/download  Fact Sheet for Healthcare Providers: quierodirigir.comhttps://www.fda.gov/media/138095/download  This test is not yet approved or cleared by the [place] FDA and  has been authorized for detection and/or diagnosis of SARS-CoV-2 by FDA under an Emergency Use Authorization (EUA). This EUA will remain  in effect (meaning this test can be used) for the duration of the COVID-19 declaration under Se ction 564(b)(1) of the Act, 21 U.S.C. section 360bbb-3(b)(1), unless the authorization is  terminated or revoked sooner.  Performed at [facility], [address], [place], [place] [address]      Time coordinating discharge: 28 minutes  SIGNED:   Dorcas Carrow, MD  [facility] 06/25/2020, 1:08 PM

## 2020-06-27 ENCOUNTER — Telehealth: Payer: Self-pay

## 2020-06-27 NOTE — Telephone Encounter (Signed)
Transition Care Management Unsuccessful Follow-up Telephone Call  Date of discharge and from where:  06/25/2020-[place]  Attempts:  1st Attempt  Reason for unsuccessful TCM follow-up call:  Left voice message

## 2020-06-28 NOTE — Telephone Encounter (Signed)
Transition Care Management Follow-up Telephone Call Date of discharge and from where: 06/25/2020-[place] How have you been since you were released from the hospital? Doing good but still having some abdominal pain. GI has schedule a CT scan for 07/06/20 Any questions or concerns? No  Items Reviewed: Did the pt receive and understand the discharge instructions provided? Yes  Medications obtained and verified? Yes  Other? Yes  Any new allergies since your discharge? No  Dietary orders reviewed? Yes Do you have support at home? Yes   Home Care and Equipment/Supplies: Were home health services ordered? no If so, what is the name of the agency? N/a  Has the agency set up a time to come to the patient's home? not applicable Were any new equipment or medical supplies ordered?  No What is the name of the medical supply agency? N/a Were you able to get the supplies/equipment? not applicable Do you have any questions related to the use of the equipment or supplies? N/a  Functional Questionnaire: (I = Independent and D = Dependent) ADLs: I  Bathing/Dressing- I  Meal Prep- I  Eating- I  Maintaining continence- I  Transferring/Ambulation- I  Managing Meds- I  Follow up appointments reviewed:  PCP Hospital f/u appt confirmed? Yes  Scheduled to see Alysia Penna on 07/07/2020 @ 11:00. Specialist Hospital f/u appt confirmed? No  Patient states her appt with GI will be scheduled after her CT scan Are transportation arrangements needed? No  If their condition worsens, is the pt aware to call PCP or go to the Emergency Dept.? Yes Was the patient provided with contact information for the PCP's office or ED? Yes Was to pt encouraged to call back with questions or concerns? Yes

## 2020-07-06 ENCOUNTER — Other Ambulatory Visit: Payer: Self-pay

## 2020-07-06 ENCOUNTER — Ambulatory Visit
Admission: RE | Admit: 2020-07-06 | Discharge: 2020-07-06 | Disposition: A | Discharge status: Home / Self Care | Payer: Medicaid Other | Source: Ambulatory Visit | Attending: Gastroenterology | Admitting: Gastroenterology

## 2020-07-06 DIAGNOSIS — K6389 Other specified diseases of intestine: Secondary | ICD-10-CM | POA: Diagnosis not present

## 2020-07-06 DIAGNOSIS — R1084 Generalized abdominal pain: Secondary | ICD-10-CM

## 2020-07-06 DIAGNOSIS — R112 Nausea with vomiting, unspecified: Secondary | ICD-10-CM | POA: Diagnosis not present

## 2020-07-06 DIAGNOSIS — R634 Abnormal weight loss: Secondary | ICD-10-CM | POA: Diagnosis not present

## 2020-07-06 MED ORDER — IOPAMIDOL (ISOVUE-300) INJECTION 61%
100.0000 mL | Freq: Once | INTRAVENOUS | Status: AC | PRN
  Administered 2020-07-06: 100 mL via INTRAVENOUS

## 2020-07-07 ENCOUNTER — Inpatient Hospital Stay: Payer: Medicaid Other | Admitting: Nurse Practitioner

## 2020-08-08 DIAGNOSIS — K3189 Other diseases of stomach and duodenum: Secondary | ICD-10-CM | POA: Diagnosis not present

## 2020-08-08 DIAGNOSIS — R112 Nausea with vomiting, unspecified: Secondary | ICD-10-CM | POA: Diagnosis not present

## 2020-08-08 DIAGNOSIS — R1084 Generalized abdominal pain: Secondary | ICD-10-CM | POA: Diagnosis not present

## 2020-08-08 DIAGNOSIS — K2930 Chronic superficial gastritis without bleeding: Secondary | ICD-10-CM | POA: Diagnosis not present

## 2020-08-08 DIAGNOSIS — R634 Abnormal weight loss: Secondary | ICD-10-CM | POA: Diagnosis not present

## 2020-08-30 DIAGNOSIS — I10 Essential (primary) hypertension: Secondary | ICD-10-CM | POA: Diagnosis not present

## 2020-08-30 DIAGNOSIS — Z6821 Body mass index (BMI) 21.0-21.9, adult: Secondary | ICD-10-CM | POA: Diagnosis not present

## 2020-08-30 DIAGNOSIS — E1065 Type 1 diabetes mellitus with hyperglycemia: Secondary | ICD-10-CM | POA: Diagnosis not present

## 2020-09-02 DIAGNOSIS — R1084 Generalized abdominal pain: Secondary | ICD-10-CM | POA: Diagnosis not present

## 2020-09-02 DIAGNOSIS — K219 Gastro-esophageal reflux disease without esophagitis: Secondary | ICD-10-CM | POA: Diagnosis not present

## 2020-09-02 DIAGNOSIS — K2930 Chronic superficial gastritis without bleeding: Secondary | ICD-10-CM | POA: Diagnosis not present

## 2020-10-23 DIAGNOSIS — R000 Tachycardia, unspecified: Secondary | ICD-10-CM | POA: Diagnosis not present

## 2020-10-23 DIAGNOSIS — Z6820 Body mass index (BMI) 20.0-20.9, adult: Secondary | ICD-10-CM | POA: Diagnosis not present

## 2020-10-23 DIAGNOSIS — Z20822 Contact with and (suspected) exposure to covid-19: Secondary | ICD-10-CM | POA: Diagnosis not present

## 2020-10-23 DIAGNOSIS — E1010 Type 1 diabetes mellitus with ketoacidosis without coma: Secondary | ICD-10-CM | POA: Diagnosis not present

## 2020-10-23 DIAGNOSIS — R1013 Epigastric pain: Secondary | ICD-10-CM | POA: Diagnosis not present

## 2020-10-24 DIAGNOSIS — E7132 Disorders of ketone metabolism: Secondary | ICD-10-CM | POA: Diagnosis not present

## 2020-10-24 DIAGNOSIS — I10 Essential (primary) hypertension: Secondary | ICD-10-CM | POA: Diagnosis not present

## 2020-10-24 DIAGNOSIS — E1065 Type 1 diabetes mellitus with hyperglycemia: Secondary | ICD-10-CM | POA: Diagnosis not present

## 2020-10-24 DIAGNOSIS — N179 Acute kidney failure, unspecified: Secondary | ICD-10-CM | POA: Insufficient documentation

## 2020-10-24 DIAGNOSIS — Z794 Long term (current) use of insulin: Secondary | ICD-10-CM | POA: Diagnosis not present

## 2020-10-24 DIAGNOSIS — F17200 Nicotine dependence, unspecified, uncomplicated: Secondary | ICD-10-CM | POA: Insufficient documentation

## 2020-10-24 DIAGNOSIS — E876 Hypokalemia: Secondary | ICD-10-CM | POA: Diagnosis not present

## 2020-10-24 DIAGNOSIS — E1310 Other specified diabetes mellitus with ketoacidosis without coma: Secondary | ICD-10-CM | POA: Diagnosis not present

## 2020-10-24 DIAGNOSIS — E1069 Type 1 diabetes mellitus with other specified complication: Secondary | ICD-10-CM | POA: Diagnosis not present

## 2020-11-04 ENCOUNTER — Inpatient Hospital Stay: Payer: Medicaid Other | Admitting: Family Medicine

## 2020-11-09 ENCOUNTER — Other Ambulatory Visit: Payer: Self-pay

## 2020-11-10 ENCOUNTER — Inpatient Hospital Stay: Payer: Medicaid Other | Admitting: Nurse Practitioner

## 2020-11-23 ENCOUNTER — Encounter: Payer: Self-pay | Admitting: Nurse Practitioner

## 2020-12-03 DIAGNOSIS — Z20822 Contact with and (suspected) exposure to covid-19: Secondary | ICD-10-CM | POA: Diagnosis not present

## 2020-12-03 DIAGNOSIS — N179 Acute kidney failure, unspecified: Secondary | ICD-10-CM | POA: Diagnosis not present

## 2020-12-03 DIAGNOSIS — K5900 Constipation, unspecified: Secondary | ICD-10-CM | POA: Diagnosis not present

## 2020-12-03 DIAGNOSIS — Z6821 Body mass index (BMI) 21.0-21.9, adult: Secondary | ICD-10-CM | POA: Diagnosis not present

## 2020-12-03 DIAGNOSIS — R1013 Epigastric pain: Secondary | ICD-10-CM | POA: Diagnosis not present

## 2020-12-03 DIAGNOSIS — R1011 Right upper quadrant pain: Secondary | ICD-10-CM | POA: Diagnosis not present

## 2020-12-03 DIAGNOSIS — R112 Nausea with vomiting, unspecified: Secondary | ICD-10-CM | POA: Diagnosis not present

## 2020-12-03 DIAGNOSIS — E1165 Type 2 diabetes mellitus with hyperglycemia: Secondary | ICD-10-CM | POA: Diagnosis not present

## 2020-12-04 DIAGNOSIS — Z6821 Body mass index (BMI) 21.0-21.9, adult: Secondary | ICD-10-CM | POA: Diagnosis not present

## 2021-01-05 DIAGNOSIS — R1013 Epigastric pain: Secondary | ICD-10-CM | POA: Diagnosis not present

## 2021-01-05 DIAGNOSIS — Z6820 Body mass index (BMI) 20.0-20.9, adult: Secondary | ICD-10-CM | POA: Diagnosis not present

## 2021-01-05 DIAGNOSIS — Z20822 Contact with and (suspected) exposure to covid-19: Secondary | ICD-10-CM | POA: Diagnosis not present

## 2021-01-05 DIAGNOSIS — R630 Anorexia: Secondary | ICD-10-CM | POA: Diagnosis not present

## 2021-01-06 DIAGNOSIS — R109 Unspecified abdominal pain: Secondary | ICD-10-CM | POA: Diagnosis not present

## 2021-01-06 DIAGNOSIS — N179 Acute kidney failure, unspecified: Secondary | ICD-10-CM | POA: Diagnosis not present

## 2021-01-06 DIAGNOSIS — K5900 Constipation, unspecified: Secondary | ICD-10-CM | POA: Diagnosis not present

## 2021-01-06 DIAGNOSIS — I10 Essential (primary) hypertension: Secondary | ICD-10-CM | POA: Diagnosis not present

## 2021-01-06 DIAGNOSIS — E1010 Type 1 diabetes mellitus with ketoacidosis without coma: Secondary | ICD-10-CM | POA: Diagnosis not present

## 2021-01-06 DIAGNOSIS — R112 Nausea with vomiting, unspecified: Secondary | ICD-10-CM | POA: Diagnosis not present

## 2021-01-07 DIAGNOSIS — E1010 Type 1 diabetes mellitus with ketoacidosis without coma: Secondary | ICD-10-CM | POA: Diagnosis not present

## 2021-01-07 DIAGNOSIS — R7989 Other specified abnormal findings of blood chemistry: Secondary | ICD-10-CM | POA: Diagnosis not present

## 2021-02-01 DIAGNOSIS — R1084 Generalized abdominal pain: Secondary | ICD-10-CM | POA: Diagnosis not present

## 2021-02-01 DIAGNOSIS — Z20822 Contact with and (suspected) exposure to covid-19: Secondary | ICD-10-CM | POA: Diagnosis not present

## 2021-02-01 DIAGNOSIS — R112 Nausea with vomiting, unspecified: Secondary | ICD-10-CM | POA: Diagnosis not present

## 2021-02-02 DIAGNOSIS — R112 Nausea with vomiting, unspecified: Secondary | ICD-10-CM | POA: Diagnosis not present

## 2021-04-27 DIAGNOSIS — E1010 Type 1 diabetes mellitus with ketoacidosis without coma: Secondary | ICD-10-CM | POA: Diagnosis not present

## 2021-04-27 DIAGNOSIS — R112 Nausea with vomiting, unspecified: Secondary | ICD-10-CM | POA: Diagnosis not present

## 2021-04-27 DIAGNOSIS — R1084 Generalized abdominal pain: Secondary | ICD-10-CM | POA: Diagnosis not present

## 2021-04-27 DIAGNOSIS — E1065 Type 1 diabetes mellitus with hyperglycemia: Secondary | ICD-10-CM | POA: Diagnosis not present

## 2021-04-27 DIAGNOSIS — R079 Chest pain, unspecified: Secondary | ICD-10-CM | POA: Diagnosis not present

## 2021-04-27 DIAGNOSIS — Z6820 Body mass index (BMI) 20.0-20.9, adult: Secondary | ICD-10-CM | POA: Diagnosis not present

## 2021-04-27 DIAGNOSIS — R0789 Other chest pain: Secondary | ICD-10-CM | POA: Diagnosis not present

## 2021-04-27 DIAGNOSIS — Z20822 Contact with and (suspected) exposure to covid-19: Secondary | ICD-10-CM | POA: Diagnosis not present

## 2021-05-02 DIAGNOSIS — Z6820 Body mass index (BMI) 20.0-20.9, adult: Secondary | ICD-10-CM | POA: Diagnosis not present

## 2021-05-02 DIAGNOSIS — E1065 Type 1 diabetes mellitus with hyperglycemia: Secondary | ICD-10-CM | POA: Diagnosis not present

## 2021-05-18 ENCOUNTER — Other Ambulatory Visit (HOSPITAL_COMMUNITY): Payer: Self-pay | Admitting: Gastroenterology

## 2021-05-18 ENCOUNTER — Other Ambulatory Visit: Payer: Self-pay | Admitting: Gastroenterology

## 2021-05-18 DIAGNOSIS — R112 Nausea with vomiting, unspecified: Secondary | ICD-10-CM

## 2021-05-24 DIAGNOSIS — E1065 Type 1 diabetes mellitus with hyperglycemia: Secondary | ICD-10-CM | POA: Diagnosis not present

## 2021-06-09 ENCOUNTER — Ambulatory Visit (HOSPITAL_COMMUNITY)
Admission: RE | Admit: 2021-06-09 | Discharge: 2021-06-09 | Disposition: A | Discharge status: Home / Self Care | Payer: BC Managed Care – PPO | Source: Ambulatory Visit | Attending: Gastroenterology | Admitting: Gastroenterology

## 2021-06-09 ENCOUNTER — Other Ambulatory Visit: Payer: Self-pay

## 2021-06-09 DIAGNOSIS — E119 Type 2 diabetes mellitus without complications: Secondary | ICD-10-CM | POA: Diagnosis not present

## 2021-06-09 DIAGNOSIS — R112 Nausea with vomiting, unspecified: Secondary | ICD-10-CM | POA: Diagnosis not present

## 2021-06-09 MED ORDER — TECHNETIUM TC 99M SULFUR COLLOID
2.0000 | Freq: Once | INTRAVENOUS | Status: AC | PRN
  Administered 2021-06-09: 2 via ORAL

## 2021-07-08 DIAGNOSIS — E1043 Type 1 diabetes mellitus with diabetic autonomic (poly)neuropathy: Secondary | ICD-10-CM | POA: Diagnosis not present

## 2021-07-08 DIAGNOSIS — R509 Fever, unspecified: Secondary | ICD-10-CM | POA: Diagnosis not present

## 2021-07-08 DIAGNOSIS — Z6820 Body mass index (BMI) 20.0-20.9, adult: Secondary | ICD-10-CM | POA: Diagnosis not present

## 2021-07-08 DIAGNOSIS — E1010 Type 1 diabetes mellitus with ketoacidosis without coma: Secondary | ICD-10-CM | POA: Diagnosis not present

## 2021-07-08 DIAGNOSIS — D72829 Elevated white blood cell count, unspecified: Secondary | ICD-10-CM | POA: Diagnosis not present

## 2021-07-08 DIAGNOSIS — R1010 Upper abdominal pain, unspecified: Secondary | ICD-10-CM | POA: Diagnosis not present

## 2021-07-08 DIAGNOSIS — E1065 Type 1 diabetes mellitus with hyperglycemia: Secondary | ICD-10-CM | POA: Diagnosis not present

## 2021-07-08 DIAGNOSIS — R7402 Elevation of levels of lactic acid dehydrogenase (LDH): Secondary | ICD-10-CM | POA: Diagnosis not present

## 2021-07-08 DIAGNOSIS — R112 Nausea with vomiting, unspecified: Secondary | ICD-10-CM | POA: Diagnosis not present

## 2021-08-04 ENCOUNTER — Observation Stay (HOSPITAL_COMMUNITY)
Admission: EM | Admit: 2021-08-04 | Discharge: 2021-08-05 | Disposition: A | Discharge status: Home / Self Care | Payer: BC Managed Care – PPO | Attending: Internal Medicine | Admitting: Internal Medicine

## 2021-08-04 ENCOUNTER — Observation Stay (HOSPITAL_COMMUNITY): Payer: BC Managed Care – PPO

## 2021-08-04 ENCOUNTER — Encounter (HOSPITAL_COMMUNITY): Payer: Self-pay

## 2021-08-04 ENCOUNTER — Other Ambulatory Visit: Payer: Self-pay

## 2021-08-04 ENCOUNTER — Emergency Department (HOSPITAL_COMMUNITY): Payer: BC Managed Care – PPO

## 2021-08-04 DIAGNOSIS — I10 Essential (primary) hypertension: Secondary | ICD-10-CM | POA: Diagnosis not present

## 2021-08-04 DIAGNOSIS — E1010 Type 1 diabetes mellitus with ketoacidosis without coma: Secondary | ICD-10-CM | POA: Diagnosis not present

## 2021-08-04 DIAGNOSIS — R1013 Epigastric pain: Secondary | ICD-10-CM | POA: Insufficient documentation

## 2021-08-04 DIAGNOSIS — R1116 Cannabis hyperemesis syndrome: Secondary | ICD-10-CM | POA: Diagnosis present

## 2021-08-04 DIAGNOSIS — F17210 Nicotine dependence, cigarettes, uncomplicated: Secondary | ICD-10-CM | POA: Diagnosis not present

## 2021-08-04 DIAGNOSIS — Z794 Long term (current) use of insulin: Secondary | ICD-10-CM | POA: Diagnosis not present

## 2021-08-04 DIAGNOSIS — Z79899 Other long term (current) drug therapy: Secondary | ICD-10-CM | POA: Insufficient documentation

## 2021-08-04 DIAGNOSIS — R112 Nausea with vomiting, unspecified: Principal | ICD-10-CM | POA: Insufficient documentation

## 2021-08-04 DIAGNOSIS — E1165 Type 2 diabetes mellitus with hyperglycemia: Secondary | ICD-10-CM | POA: Diagnosis not present

## 2021-08-04 DIAGNOSIS — J45909 Unspecified asthma, uncomplicated: Secondary | ICD-10-CM | POA: Diagnosis not present

## 2021-08-04 DIAGNOSIS — R739 Hyperglycemia, unspecified: Secondary | ICD-10-CM

## 2021-08-04 DIAGNOSIS — R000 Tachycardia, unspecified: Secondary | ICD-10-CM | POA: Diagnosis not present

## 2021-08-04 LAB — CBC WITH DIFFERENTIAL/PLATELET
Abs Immature Granulocytes: 0.06 10*3/uL (ref 0.00–0.07)
Basophils Absolute: 0 10*3/uL (ref 0.0–0.1)
Basophils Relative: 0 %
Eosinophils Absolute: 0 10*3/uL (ref 0.0–0.5)
Eosinophils Relative: 0 %
HCT: 37.2 % (ref 36.0–46.0)
Hemoglobin: 12.8 g/dL (ref 12.0–15.0)
Immature Granulocytes: 0 %
Lymphocytes Relative: 10 %
Lymphs Abs: 1.4 10*3/uL (ref 0.7–4.0)
MCH: 31.1 pg (ref 26.0–34.0)
MCHC: 34.4 g/dL (ref 30.0–36.0)
MCV: 90.5 fL (ref 80.0–100.0)
Monocytes Absolute: 0.3 10*3/uL (ref 0.1–1.0)
Monocytes Relative: 2 %
Neutro Abs: 12.4 10*3/uL — ABNORMAL HIGH (ref 1.7–7.7)
Neutrophils Relative %: 88 %
Platelets: 324 10*3/uL (ref 150–400)
RBC: 4.11 MIL/uL (ref 3.87–5.11)
RDW: 13.3 % (ref 11.5–15.5)
WBC: 14.2 10*3/uL — ABNORMAL HIGH (ref 4.0–10.5)
nRBC: 0 % (ref 0.0–0.2)

## 2021-08-04 LAB — COMPREHENSIVE METABOLIC PANEL
ALT: 13 U/L (ref 0–44)
AST: 14 U/L — ABNORMAL LOW (ref 15–41)
Albumin: 4.7 g/dL (ref 3.5–5.0)
Alkaline Phosphatase: 68 U/L (ref 38–126)
Anion gap: 12 (ref 5–15)
BUN: 22 mg/dL — ABNORMAL HIGH (ref 6–20)
CO2: 17 mmol/L — ABNORMAL LOW (ref 22–32)
Calcium: 10.2 mg/dL (ref 8.9–10.3)
Chloride: 108 mmol/L (ref 98–111)
Creatinine, Ser: 1.14 mg/dL — ABNORMAL HIGH (ref 0.44–1.00)
GFR, Estimated: >60 mL/min (ref >60)
Glucose, Bld: 349 mg/dL — ABNORMAL HIGH (ref 70–99)
Potassium: 5 mmol/L (ref 3.5–5.1)
Sodium: 137 mmol/L (ref 135–145)
Total Bilirubin: 1 mg/dL (ref 0.3–1.2)
Total Protein: 8.1 g/dL (ref 6.5–8.1)

## 2021-08-04 LAB — BLOOD GAS, VENOUS
Acid-base deficit: 2.2 mmol/L — ABNORMAL HIGH (ref 0.0–2.0)
Bicarbonate: 19.2 mmol/L — ABNORMAL LOW (ref 20.0–28.0)
O2 Saturation: 100 %
Patient temperature: 37
pCO2, Ven: 24 mmHg — ABNORMAL LOW (ref 44–60)
pH, Ven: 7.51 — ABNORMAL HIGH (ref 7.25–7.43)
pO2, Ven: 118 mmHg — ABNORMAL HIGH (ref 32–45)

## 2021-08-04 LAB — URINALYSIS, ROUTINE W REFLEX MICROSCOPIC
Bilirubin Urine: NEGATIVE
Glucose, UA: >=500 mg/dL — AB
Ketones, ur: 20 mg/dL — AB
Nitrite: NEGATIVE
Protein, ur: 100 mg/dL — AB
Specific Gravity, Urine: 1.009 (ref 1.005–1.030)
pH: 5 (ref 5.0–8.0)

## 2021-08-04 LAB — CBG MONITORING, ED
Glucose-Capillary: 219 mg/dL — ABNORMAL HIGH (ref 70–99)
Glucose-Capillary: 329 mg/dL — ABNORMAL HIGH (ref 70–99)
Glucose-Capillary: 331 mg/dL — ABNORMAL HIGH (ref 70–99)
Glucose-Capillary: 409 mg/dL — ABNORMAL HIGH (ref 70–99)

## 2021-08-04 LAB — BETA-HYDROXYBUTYRIC ACID: Beta-Hydroxybutyric Acid: 3.52 mmol/L — ABNORMAL HIGH (ref 0.05–0.27)

## 2021-08-04 LAB — I-STAT BETA HCG BLOOD, ED (MC, WL, AP ONLY): I-stat hCG, quantitative: <5 m[IU]/mL (ref <5)

## 2021-08-04 LAB — LIPASE, BLOOD: Lipase: 23 U/L (ref 11–51)

## 2021-08-04 MED ORDER — LACTATED RINGERS IV SOLN: INTRAVENOUS | Status: DC

## 2021-08-04 MED ORDER — DEXTROSE IN LACTATED RINGERS 5 % IV SOLN: INTRAVENOUS | Status: DC

## 2021-08-04 MED ORDER — FENTANYL CITRATE PF 50 MCG/ML IJ SOSY
50.0000 ug | PREFILLED_SYRINGE | Freq: Once | INTRAMUSCULAR | Status: AC
  Administered 2021-08-04: 50 ug via INTRAVENOUS
  Filled 2021-08-04: qty 1

## 2021-08-04 MED ORDER — DEXTROSE 50 % IV SOLN: 0.0000 mL | INTRAVENOUS | Status: DC | PRN

## 2021-08-04 MED ORDER — ONDANSETRON HCL 4 MG/2ML IJ SOLN: 4.0000 mg | Freq: Four times a day (QID) | INTRAMUSCULAR | Status: DC | PRN

## 2021-08-04 MED ORDER — ACETAMINOPHEN 650 MG RE SUPP: 650.0000 mg | Freq: Four times a day (QID) | RECTAL | Status: DC | PRN

## 2021-08-04 MED ORDER — LACTATED RINGERS IV BOLUS
1000.0000 mL | Freq: Once | INTRAVENOUS | Status: AC
  Administered 2021-08-04: 1000 mL via INTRAVENOUS

## 2021-08-04 MED ORDER — METOCLOPRAMIDE HCL 5 MG/ML IJ SOLN
5.0000 mg | Freq: Once | INTRAMUSCULAR | Status: AC
  Administered 2021-08-04: 5 mg via INTRAVENOUS
  Filled 2021-08-04: qty 2

## 2021-08-04 MED ORDER — INSULIN REGULAR(HUMAN) IN NACL 100-0.9 UT/100ML-% IV SOLN: INTRAVENOUS | Status: DC

## 2021-08-04 MED ORDER — ONDANSETRON HCL 4 MG PO TABS: 4.0000 mg | ORAL_TABLET | Freq: Four times a day (QID) | ORAL | Status: DC | PRN

## 2021-08-04 MED ORDER — ACETAMINOPHEN 325 MG PO TABS: 650.0000 mg | ORAL_TABLET | Freq: Four times a day (QID) | ORAL | Status: DC | PRN

## 2021-08-04 MED ORDER — METOCLOPRAMIDE HCL 5 MG/ML IJ SOLN
10.0000 mg | Freq: Once | INTRAMUSCULAR | Status: AC
  Administered 2021-08-04: 10 mg via INTRAVENOUS
  Filled 2021-08-04: qty 2

## 2021-08-04 MED ORDER — POLYETHYLENE GLYCOL 3350 17 G PO PACK: 17.0000 g | PACK | Freq: Every day | ORAL | Status: DC | PRN

## 2021-08-04 MED ORDER — ENOXAPARIN SODIUM 40 MG/0.4ML IJ SOSY
40.0000 mg | PREFILLED_SYRINGE | INTRAMUSCULAR | Status: DC
  Administered 2021-08-05: 40 mg via SUBCUTANEOUS
  Filled 2021-08-04: qty 0.4

## 2021-08-04 MED ORDER — INSULIN REGULAR(HUMAN) IN NACL 100-0.9 UT/100ML-% IV SOLN
INTRAVENOUS | Status: DC
  Administered 2021-08-04: 6.5 [IU]/h via INTRAVENOUS
  Filled 2021-08-04: qty 100

## 2021-08-04 MED ORDER — HYDRALAZINE HCL 20 MG/ML IJ SOLN
10.0000 mg | Freq: Four times a day (QID) | INTRAMUSCULAR | Status: DC | PRN
Start: 2021-08-04 — End: 2021-08-05

## 2021-08-04 MED ORDER — SODIUM CHLORIDE (PF) 0.9 % IJ SOLN
INTRAMUSCULAR | Status: AC
  Filled 2021-08-04: qty 50

## 2021-08-04 MED ORDER — IOHEXOL 300 MG/ML  SOLN
100.0000 mL | Freq: Once | INTRAMUSCULAR | Status: AC | PRN
  Administered 2021-08-04: 100 mL via INTRAVENOUS

## 2021-08-04 MED ORDER — POTASSIUM CHLORIDE 10 MEQ/100ML IV SOLN
10.0000 meq | INTRAVENOUS | Status: AC
  Administered 2021-08-04 (×2): 10 meq via INTRAVENOUS
  Filled 2021-08-04 (×2): qty 100

## 2021-08-04 MED ORDER — LISINOPRIL 10 MG PO TABS
20.0000 mg | ORAL_TABLET | Freq: Every day | ORAL | Status: DC
  Administered 2021-08-05: 20 mg via ORAL
  Filled 2021-08-04: qty 2

## 2021-08-04 NOTE — Assessment & Plan Note (Signed)
.   Resume patients home regimen of oral antihypertensives . Titrate antihypertensive regimen as necessary to achieve adequate BP control . PRN intravenous antihypertensives for excessively elevated blood pressure   

## 2021-08-04 NOTE — H&P (Signed)
?History and Physical  ? ? ?Patient: [name] MRN: [id number] DOA: 08/04/2021 ? ?Date of Service: the patient was seen and examined on 08/05/2021 ? ?Patient coming from: Home ? ?Chief Complaint:  ?Chief Complaint  ?Patient presents with  ? Hyperglycemia  ? Emesis  ? ? ?HPI:  ? ?31 year old female with past medical history of diabetes mellitus type 1, hypertension who presents to [facility] department with complaints of nausea and vomiting. ? ?Patient explains that the evening of 4/20 she ate hot wings for dinner.  Shortly thereafter the patient began to experience severe epigastric pain.  This pain was sharp in quality and nonradiating.  Within the next 1 to 2 hours patient also began to experience associated episodes of intense nausea with nonbilious nonbloody vomiting.  Symptoms continue to persist overnight and throughout the following day associated with worsening generalized weakness and inability to tolerate oral intake.  Patient noted that she was hyperglycemic at home as well. ? ?Patient continued to experience bouts of nausea and vomiting and ongoing abdominal pain prompting her to present to [facility] for evaluation. ? ?Upon evaluation in the emergency department patient was found to exhibit multiple SIRS criteria including substantial tachycardia and tachypnea with leukocytosis.  Initial work-up revealed a bicarbonate of 17 with slightly elevated anion gap and elevated beta hydroxybutyric acid of 3.52 concerning for early diabetic ketoacidosis.  Patient was initiated on intravenous fluids, and insulin infusion and several doses of Reglan as well as fentanyl for abdominal pain.  CT imaging of the abdomen and pelvis was performed revealing no acute intra-abdominal pathology.  The hospitalist group was then called to assess the patient for admission to the hospital. ? ?Review of Systems: Review of Systems  ?Gastrointestinal:  Positive for abdominal  pain, nausea and vomiting.  ?Neurological:  Positive for weakness.  ?All other systems reviewed and are negative. ? ? ?Past Medical History:  ?Diagnosis Date  ? Asthma   ? Diabetes mellitus   ? type 1   ? Essential hypertension, benign 02/10/2016  ? ? ?History reviewed. No pertinent surgical history. ? ?Social History:  reports that she has been smoking cigarettes. She uses smokeless tobacco. She reports that she does not drink alcohol and does not use drugs. ? ?Allergies  ?Allergen Reactions  ? Aleve [Naproxen Sodium] Swelling  ? ? ?Family History  ?Problem Relation Age of Onset  ? Hypertension Paternal Grandmother   ? ? ?Prior to Admission medications   ?Medication Sig Start Date End Date Taking? Authorizing Provider  ?Albuterol Sulfate (PROAIR RESPICLICK) 108 (90 Base) MCG/ACT AEPB Inhale 1-2 puffs into the lungs every 6 (six) hours as needed. 09/22/19   Nche, Bonna Gainsharlotte Lum, NP  ?cetirizine (ZYRTEC) 10 MG tablet Take 1 tablet (10 mg total) by mouth daily. 09/22/19   Nche, Bonna Gainsharlotte Lum, NP  ?Continuous Blood Gluc Sensor (FREESTYLE LIBRE 14 DAY SENSOR) MISC by Other route every fourteen (14) days. One libre sensor every 14 days 01/21/18   [provider]  ?fluticasone (FLONASE) 50 MCG/ACT nasal spray Place 2 sprays into both nostrils daily. 09/22/19   Nche, Bonna Gainsharlotte Lum, NP  ?Insulin Pen Needle (ULTRA-THIN II SHORT PEN NEEDLE) 31G X 8 MM MISC Inject into the skin. 01/21/18   [provider]  ?lisinopril (ZESTRIL) 10 MG tablet Take 20 mg by mouth daily. 09/09/19   [provider]  ?metoCLOPramide (REGLAN) 10 MG tablet Take 1 tablet (10 mg total) by mouth every 6 (six) hours for 10  doses. 05/11/20 05/14/20  Cheryll Cockayne, MD  ?NOVOLOG FLEXPEN 100 UNIT/ML FlexPen Inject 1-9 Units into the skin 3 (three) times daily with meals.  07/22/16   [provider]  ?ondansetron (ZOFRAN-ODT) 4 MG disintegrating tablet Take 1 tablet (4 mg total) by mouth every 8 (eight) hours as needed for nausea or  vomiting. 06/05/17   Mardella Layman, MD  ?polyethylene glycol powder (GLYCOLAX/MIRALAX) 17 GM/SCOOP powder Take 17 g by mouth daily. 09/22/19   Nche, Bonna Gains, NP  ?ranitidine (ZANTAC) 75 MG tablet Take 75 mg by mouth daily as needed for heartburn.  06/15/20  [provider]  ? ? ?Physical Exam: ? ?Vitals:  ? 08/05/21 0200 08/05/21 0300 08/05/21 0400 08/05/21 0500  ?BP: 121/78 107/67 (!) 114/56 126/67  ?Pulse: (!) 111 (!) 107  99  ?Resp: 16 17 16 16   ?Temp:   98.1 ?F (36.7 ?C)   ?TempSrc:   Oral   ?SpO2: 96% 100% 99% 100%  ?Weight:      ?Height:      ? ? ?Constitutional: Awake alert and oriented x3, patient is in mild distress.   ?Skin: no rashes, no lesions, poor skin turgor noted. ?Eyes: Pupils are equally reactive to light.  No evidence of scleral icterus or conjunctival pallor.  ?ENMT: Dry mucous membranes noted.  Posterior pharynx clear of any exudate or lesions.   ?Neck: normal, supple, no masses, no thyromegaly.  No evidence of jugular venous distension.   ?Respiratory: clear to auscultation bilaterally, no wheezing, no crackles. Normal respiratory effort. No accessory muscle use.  ?Cardiovascular: Regular rate and rhythm, no murmurs / rubs / gallops. No extremity edema. 2+ pedal pulses. No carotid bruits.  ?Chest:   Nontender without crepitus or deformity.   ?Back:   Nontender without crepitus or deformity. ?Abdomen: Mild epigastric tenderness.  Abdomen is soft..  No evidence of intra-abdominal masses.  Positive bowel sounds noted in all quadrants.   ?Musculoskeletal: No joint deformity upper and lower extremities. Good ROM, no contractures. Normal muscle tone.  ?Neurologic: CN 2-12 grossly intact. Sensation intact.  Patient moving all 4 extremities spontaneously.  Patient is following all commands.  Patient is responsive to verbal stimuli.   ?Psychiatric: Patient exhibits normal mood with appropriate affect.  Patient seems to possess insight as to their current situation.   ? ?Data Reviewed: ? ?I  have personally reviewed and interpreted labs, imaging. ? ?Significant findings are White blood cell count of 14.2, serum bicarbonate of 17, beta hydroxybutyric acid of 3.52, urinalysis revealing ketones. ? ?CT imaging of the abdomen and pelvis revealing no evidence of acute abnormality in the abdomen or pelvis. ? ?EKG: Personally reviewed.  Rhythm is sinus tachycardia with heart rate of 127 bpm.  Evidence of left anterior fascicular block.  No dynamic ST segment changes appreciated. ? ? ?Assessment and Plan: ?* Intractable nausea and vomiting ?Patient presenting with rather rapid onset of intractable nausea and vomiting with inability to tolerate oral intake despite oral challenges here in the emergency department. ?This presentation is similar to previous presentations resulting in ER visits and hospitalization in March. ?While diabetic gastroparesis has been suspected patient did undergo gastric emptying study 05/2021 that was not suggestive of delayed gastric emptying. ?Patient does report regular use of marijuana and therefore cannabanoid hyperemesis syndrome must also be considered ?CT imaging of the abdomen pelvis reveals no evidence of obstruction or infection. ?Hydrating patient with intravenous isotonic fluids ?Providing patient with as needed antiemetic ?Symptoms should be self-limiting with supportive  care ? ?Diabetic ketoacidosis without coma associated with type 1 diabetes mellitus (HCC) ?Patient presenting with elevated beta hydroxybutyrate, decreased serum bicarbonate on initial chemistry with borderline elevated anion gap all concerning for early diabetic ketoacidosis. ?Unclear what precipitated this event. ?Patient's been placed on insulin infusion ?Patient is being hydrated aggressively with intravenous isotonic fluids ?Performing serial chemistries and serial beta hydroxybutyrate levels ?Patient is currently n.p.o. with exception of ice chips and sips of water ?Patient will remain on insulin  infusion until anion gap is closed ? ? ? ?Essential hypertension, benign ?Resume patients home regimen of oral antihypertensives ?Titrate antihypertensive regimen as necessary to achieve adequate BP control ?PRN in

## 2021-08-04 NOTE — Assessment & Plan Note (Signed)
?   Patient presenting with elevated beta hydroxybutyrate, decreased serum bicarbonate on initial chemistry with borderline elevated anion gap all concerning for early diabetic ketoacidosis. ?? Unclear what precipitated this event. ?? Patient's been placed on insulin infusion ?? Patient is being hydrated aggressively with intravenous isotonic fluids ?? Performing serial chemistries and serial beta hydroxybutyrate levels ?? Patient is currently n.p.o. with exception of ice chips and sips of water ?? Patient will remain on insulin infusion until anion gap is closed ? ? ?

## 2021-08-04 NOTE — Assessment & Plan Note (Addendum)
?   Patient presenting with rather rapid onset of intractable nausea and vomiting with inability to tolerate oral intake despite oral challenges here in the emergency department. ?? This presentation is similar to previous presentations resulting in ER visits and hospitalization in March. ?? While diabetic gastroparesis has been suspected patient did undergo gastric emptying study 05/2021 that was not suggestive of delayed gastric emptying. ?? Patient does report regular use of marijuana and therefore cannabanoid hyperemesis syndrome must also be considered ?? CT imaging of the abdomen pelvis reveals no evidence of obstruction or infection. ?? Hydrating patient with intravenous isotonic fluids ?? Providing patient with as needed antiemetic ?? Symptoms should be self-limiting with supportive care ?

## 2021-08-04 NOTE — ED Triage Notes (Signed)
Pt reports with vomiting and hyperglycemia since today. Pt has an insulin pump but reports that her glucose remains high.  ?

## 2021-08-04 NOTE — ED Provider Notes (Signed)
?[facility] DEPT ?Provider Note ? ? ?CSN: [id number] ?Arrival date & time: 08/04/21  1859 ? ?  ? ?History ? ?Chief Complaint  ?Patient presents with  ? Hyperglycemia  ? Emesis  ? ? ?[name] is a 31 y.o. female. ? ?HPI ?Patient is a 31 year old female with a history of type 1 diabetes mellitus who presents to the emergency department due to hyperglycemia as well as nausea and vomiting.  Symptoms started earlier today.  States she has been unable to tolerate any p.o. intake due to her symptoms.  Reports associated upper abdominal pain.  She has an insulin pump.  Reports recent admissions for DKA.  She states that she is continuing to have difficulty with p.o. intake for many weeks and is losing weight due to this.  She states that "I used to eat 3-4 full meals a day and now I can barely eat 2". ?  ? ?Home Medications ?Prior to Admission medications   ?Medication Sig Start Date End Date Taking? Authorizing Provider  ?Albuterol Sulfate (PROAIR RESPICLICK) 123XX123 (90 Base) MCG/ACT AEPB Inhale 1-2 puffs into the lungs every 6 (six) hours as needed. 09/22/19   Nche, Charlene Brooke, NP  ?cetirizine (ZYRTEC) 10 MG tablet Take 1 tablet (10 mg total) by mouth daily. 09/22/19   Nche, Charlene Brooke, NP  ?Continuous Blood Gluc Sensor (FREESTYLE LIBRE 14 DAY SENSOR) MISC by Other route every fourteen (14) days. One libre sensor every 14 days 01/21/18   [provider]  ?fluticasone (FLONASE) 50 MCG/ACT nasal spray Place 2 sprays into both nostrils daily. 09/22/19   Nche, Charlene Brooke, NP  ?Insulin Pen Needle (ULTRA-THIN II SHORT PEN NEEDLE) 31G X 8 MM MISC Inject into the skin. 01/21/18   [provider]  ?lisinopril (ZESTRIL) 10 MG tablet Take 20 mg by mouth daily. 09/09/19   [provider]  ?metoCLOPramide (REGLAN) 10 MG tablet Take 1 tablet (10 mg total) by mouth every 6 (six) hours for 10 doses. 05/11/20 05/14/20  Luna Fuse, MD  ?NOVOLOG FLEXPEN 100 UNIT/ML FlexPen Inject  1-9 Units into the skin 3 (three) times daily with meals.  07/22/16   [provider]  ?ondansetron (ZOFRAN-ODT) 4 MG disintegrating tablet Take 1 tablet (4 mg total) by mouth every 8 (eight) hours as needed for nausea or vomiting. 06/05/17   Vanessa Kick, MD  ?polyethylene glycol powder (GLYCOLAX/MIRALAX) 17 GM/SCOOP powder Take 17 g by mouth daily. 09/22/19   Nche, Charlene Brooke, NP  ?ranitidine (ZANTAC) 75 MG tablet Take 75 mg by mouth daily as needed for heartburn.  06/15/20  [provider]  ?   ? ?Allergies    ?Aleve [naproxen sodium]   ? ?Review of Systems   ?Review of Systems  ?All other systems reviewed and are negative. ?Ten systems reviewed and are negative for acute change, except as noted in the HPI.   ?Physical Exam ?Updated Vital Signs ?BP (!) 187/99   Pulse (!) 134   Temp 97.8 ?F (36.6 ?C) (Oral) Comment: Pt has been eating ice cubes  Resp (!) 23   Ht 5\' 5"  (1.651 m)   Wt 54.4 kg   SpO2 100%   BMI 19.97 kg/m?  ?Physical Exam ?Vitals and nursing note reviewed.  ?Constitutional:   ?   General: She is not in acute distress. ?   Appearance: Normal appearance. She is not ill-appearing, toxic-appearing or diaphoretic.  ?HENT:  ?   Head: Normocephalic and atraumatic.  ?   Right  Ear: External ear normal.  ?   Left Ear: External ear normal.  ?   Nose: Nose normal.  ?   Mouth/Throat:  ?   Mouth: Mucous membranes are moist.  ?   Pharynx: Oropharynx is clear. No oropharyngeal exudate or posterior oropharyngeal erythema.  ?Eyes:  ?   Extraocular Movements: Extraocular movements intact.  ?Cardiovascular:  ?   Rate and Rhythm: Regular rhythm. Tachycardia present.  ?   Pulses: Normal pulses.  ?   Heart sounds: Normal heart sounds. No murmur heard. ?  No friction rub. No gallop.  ?Pulmonary:  ?   Effort: Pulmonary effort is normal. No respiratory distress.  ?   Breath sounds: Normal breath sounds. No stridor. No wheezing, rhonchi or rales.  ?Abdominal:  ?   General: Abdomen is flat.  ?    Palpations: Abdomen is soft.  ?   Tenderness: There is abdominal tenderness.  ?   Comments: Abdomen is flat and soft.  Moderate tenderness noted overlying the epigastrium.  ?Musculoskeletal:     ?   General: Normal range of motion.  ?   Cervical back: Normal range of motion and neck supple. No tenderness.  ?Skin: ?   General: Skin is warm and dry.  ?Neurological:  ?   General: No focal deficit present.  ?   Mental Status: She is alert and oriented to person, place, and time.  ?Psychiatric:     ?   Mood and Affect: Mood normal.     ?   Behavior: Behavior normal.  ? ?ED Results / Procedures / Treatments   ?Labs ?(all labs ordered are listed, but only abnormal results are displayed) ?Labs Reviewed  ?CBC WITH DIFFERENTIAL/PLATELET - Abnormal; Notable for the following components:  ?    Result Value  ? WBC 14.2 (*)   ? Neutro Abs 12.4 (*)   ? All other components within normal limits  ?COMPREHENSIVE METABOLIC PANEL - Abnormal; Notable for the following components:  ? CO2 17 (*)   ? Glucose, Bld 349 (*)   ? BUN 22 (*)   ? Creatinine, Ser 1.14 (*)   ? AST 14 (*)   ? All other components within normal limits  ?BLOOD GAS, VENOUS - Abnormal; Notable for the following components:  ? pH, Ven 7.51 (*)   ? pCO2, Ven 24 (*)   ? pO2, Ven 118 (*)   ? Bicarbonate 19.2 (*)   ? Acid-base deficit 2.2 (*)   ? All other components within normal limits  ?BETA-HYDROXYBUTYRIC ACID - Abnormal; Notable for the following components:  ? Beta-Hydroxybutyric Acid 3.52 (*)   ? All other components within normal limits  ?URINALYSIS, ROUTINE W REFLEX MICROSCOPIC - Abnormal; Notable for the following components:  ? APPearance HAZY (*)   ? Glucose, UA >=500 (*)   ? Hgb urine dipstick SMALL (*)   ? Ketones, ur 20 (*)   ? Protein, ur 100 (*)   ? Leukocytes,Ua TRACE (*)   ? Bacteria, UA RARE (*)   ? All other components within normal limits  ?CBG MONITORING, ED - Abnormal; Notable for the following components:  ? Glucose-Capillary 331 (*)   ? All other  components within normal limits  ?LIPASE, BLOOD  ?OSMOLALITY  ?I-STAT BETA HCG BLOOD, ED ([facility], [facility], [facility] ONLY)  ?CBG MONITORING, ED  ? ?EKG ?None ? ?Radiology ?CT ABDOMEN PELVIS W CONTRAST ? ?Result Date: 08/04/2021 ?CLINICAL DATA:  Epigastric pain EXAM: CT ABDOMEN AND PELVIS WITH CONTRAST TECHNIQUE: Multidetector  CT imaging of the abdomen and pelvis was performed using the standard protocol following bolus administration of intravenous contrast. RADIATION DOSE REDUCTION: This exam was performed according to the departmental dose-optimization program which includes automated exposure control, adjustment of the mA and/or kV according to patient size and/or use of iterative reconstruction technique. CONTRAST:  168mL OMNIPAQUE IOHEXOL 300 MG/ML  SOLN COMPARISON:  None. FINDINGS: Lower Chest: Normal. Hepatobiliary: Normal hepatic contours. No intra- or extrahepatic biliary dilatation. The gallbladder is normal. Pancreas: Normal pancreas. No ductal dilatation or peripancreatic fluid collection. Spleen: Normal. Adrenals/Urinary Tract: The adrenal glands are normal. No hydronephrosis, nephroureterolithiasis or solid renal mass. The urinary bladder is normal for degree of distention Stomach/Bowel: There is no hiatal hernia. Normal duodenal course and caliber. No small bowel dilatation or inflammation. No focal colonic abnormality. Normal appendix. Vascular/Lymphatic: Normal course and caliber of the major abdominal vessels. No abdominal or pelvic lymphadenopathy. Reproductive: Normal uterus. No adnexal mass. Other: None. Musculoskeletal: No bony spinal canal stenosis or focal osseous abnormality. IMPRESSION: No acute abnormality of the abdomen or pelvis. Electronically Signed   By: Ulyses Jarred M.D.   On: 08/04/2021 20:57   ? ?Procedures ?Marland KitchenCritical Care ?Performed by: Rayna Sexton, PA-C ?Authorized by: Rayna Sexton, PA-C  ? ?Critical care provider statement:  ?  Critical care time (minutes):  30 ?  Critical care was  necessary to treat or prevent imminent or life-threatening deterioration of the following conditions:  Endocrine crisis ?  Critical care was time spent personally by me on the following activities:  Development of treatme

## 2021-08-05 ENCOUNTER — Encounter (HOSPITAL_COMMUNITY): Payer: Self-pay

## 2021-08-05 DIAGNOSIS — R112 Nausea with vomiting, unspecified: Secondary | ICD-10-CM | POA: Diagnosis not present

## 2021-08-05 LAB — CBC WITH DIFFERENTIAL/PLATELET
Abs Immature Granulocytes: 0.15 10*3/uL — ABNORMAL HIGH (ref 0.00–0.07)
Basophils Absolute: 0.1 10*3/uL (ref 0.0–0.1)
Basophils Relative: 0 %
Eosinophils Absolute: 0.1 10*3/uL (ref 0.0–0.5)
Eosinophils Relative: 0 %
HCT: 30.5 % — ABNORMAL LOW (ref 36.0–46.0)
Hemoglobin: 10.3 g/dL — ABNORMAL LOW (ref 12.0–15.0)
Immature Granulocytes: 1 %
Lymphocytes Relative: 31 %
Lymphs Abs: 3.5 10*3/uL (ref 0.7–4.0)
MCH: 31.2 pg (ref 26.0–34.0)
MCHC: 33.8 g/dL (ref 30.0–36.0)
MCV: 92.4 fL (ref 80.0–100.0)
Monocytes Absolute: 0.8 10*3/uL (ref 0.1–1.0)
Monocytes Relative: 7 %
Neutro Abs: 6.9 10*3/uL (ref 1.7–7.7)
Neutrophils Relative %: 61 %
Platelets: 270 10*3/uL (ref 150–400)
RBC: 3.3 MIL/uL — ABNORMAL LOW (ref 3.87–5.11)
RDW: 13.5 % (ref 11.5–15.5)
WBC: 11.4 10*3/uL — ABNORMAL HIGH (ref 4.0–10.5)
nRBC: 0 % (ref 0.0–0.2)

## 2021-08-05 LAB — GLUCOSE, CAPILLARY
Glucose-Capillary: 144 mg/dL — ABNORMAL HIGH (ref 70–99)
Glucose-Capillary: 178 mg/dL — ABNORMAL HIGH (ref 70–99)
Glucose-Capillary: 167 mg/dL — ABNORMAL HIGH (ref 70–99)
Glucose-Capillary: 152 mg/dL — ABNORMAL HIGH (ref 70–99)
Glucose-Capillary: 150 mg/dL — ABNORMAL HIGH (ref 70–99)
Glucose-Capillary: 157 mg/dL — ABNORMAL HIGH (ref 70–99)
Glucose-Capillary: 132 mg/dL — ABNORMAL HIGH (ref 70–99)
Glucose-Capillary: 139 mg/dL — ABNORMAL HIGH (ref 70–99)
Glucose-Capillary: 148 mg/dL — ABNORMAL HIGH (ref 70–99)
Glucose-Capillary: 151 mg/dL — ABNORMAL HIGH (ref 70–99)
Glucose-Capillary: 165 mg/dL — ABNORMAL HIGH (ref 70–99)
Glucose-Capillary: 136 mg/dL — ABNORMAL HIGH (ref 70–99)

## 2021-08-05 LAB — BASIC METABOLIC PANEL
Anion gap: 5 (ref 5–15)
Anion gap: 4 — ABNORMAL LOW (ref 5–15)
BUN: 22 mg/dL — ABNORMAL HIGH (ref 6–20)
BUN: 18 mg/dL (ref 6–20)
CO2: 23 mmol/L (ref 22–32)
CO2: 24 mmol/L (ref 22–32)
Calcium: 10 mg/dL (ref 8.9–10.3)
Calcium: 9.4 mg/dL (ref 8.9–10.3)
Chloride: 112 mmol/L — ABNORMAL HIGH (ref 98–111)
Chloride: 110 mmol/L (ref 98–111)
Creatinine, Ser: 1.12 mg/dL — ABNORMAL HIGH (ref 0.44–1.00)
Creatinine, Ser: 1.05 mg/dL — ABNORMAL HIGH (ref 0.44–1.00)
GFR, Estimated: >60 mL/min (ref >60)
GFR, Estimated: >60 mL/min (ref >60)
Glucose, Bld: 153 mg/dL — ABNORMAL HIGH (ref 70–99)
Glucose, Bld: 138 mg/dL — ABNORMAL HIGH (ref 70–99)
Potassium: 4.2 mmol/L (ref 3.5–5.1)
Potassium: 3.8 mmol/L (ref 3.5–5.1)
Sodium: 140 mmol/L (ref 135–145)
Sodium: 138 mmol/L (ref 135–145)

## 2021-08-05 LAB — BETA-HYDROXYBUTYRIC ACID
Beta-Hydroxybutyric Acid: 0.37 mmol/L — ABNORMAL HIGH (ref 0.05–0.27)
Beta-Hydroxybutyric Acid: 0.09 mmol/L (ref 0.05–0.27)

## 2021-08-05 LAB — MAGNESIUM: Magnesium: 2 mg/dL (ref 1.7–2.4)

## 2021-08-05 LAB — MRSA NEXT GEN BY PCR, NASAL: MRSA by PCR Next Gen: NOT DETECTED

## 2021-08-05 LAB — OSMOLALITY: Osmolality: 299 mOsm/kg — ABNORMAL HIGH (ref 275–295)

## 2021-08-05 LAB — HIV ANTIBODY (ROUTINE TESTING W REFLEX): HIV Screen 4th Generation wRfx: NONREACTIVE

## 2021-08-05 LAB — HEMOGLOBIN A1C
Hgb A1c MFr Bld: 7.9 % — ABNORMAL HIGH (ref 4.8–5.6)
Mean Plasma Glucose: 180.03 mg/dL

## 2021-08-05 MED ORDER — INSULIN ASPART 100 UNIT/ML IJ SOLN
3.0000 [IU] | Freq: Three times a day (TID) | INTRAMUSCULAR | Status: DC
  Administered 2021-08-05: 3 [IU] via SUBCUTANEOUS

## 2021-08-05 MED ORDER — PANTOPRAZOLE SODIUM 40 MG PO TBEC
40.0000 mg | DELAYED_RELEASE_TABLET | Freq: Every day | ORAL | 0 refills | Status: AC | PRN
Start: 2021-08-05 — End: ?

## 2021-08-05 MED ORDER — CHLORHEXIDINE GLUCONATE CLOTH 2 % EX PADS: 6.0000 | MEDICATED_PAD | Freq: Every day | CUTANEOUS | Status: DC

## 2021-08-05 MED ORDER — INSULIN ASPART 100 UNIT/ML IJ SOLN
0.0000 [IU] | Freq: Three times a day (TID) | INTRAMUSCULAR | Status: DC
  Administered 2021-08-05: 2 [IU] via SUBCUTANEOUS

## 2021-08-05 MED ORDER — PANTOPRAZOLE SODIUM 40 MG PO TBEC: 40.0000 mg | DELAYED_RELEASE_TABLET | Freq: Every day | ORAL | 0 refills | Status: DC | PRN

## 2021-08-05 MED ORDER — METOCLOPRAMIDE HCL 5 MG PO TABS: 5.0000 mg | ORAL_TABLET | Freq: Four times a day (QID) | ORAL | 0 refills | Status: DC | PRN

## 2021-08-05 MED ORDER — INSULIN GLARGINE-YFGN 100 UNIT/ML ~~LOC~~ SOLN
18.0000 [IU] | SUBCUTANEOUS | Status: DC
  Administered 2021-08-05: 18 [IU] via SUBCUTANEOUS
  Filled 2021-08-05: qty 0.18

## 2021-08-05 MED ORDER — METOCLOPRAMIDE HCL 5 MG/ML IJ SOLN
10.0000 mg | Freq: Four times a day (QID) | INTRAMUSCULAR | Status: DC
  Administered 2021-08-05 (×2): 10 mg via INTRAVENOUS
  Filled 2021-08-05 (×2): qty 2

## 2021-08-05 MED ORDER — INSULIN ASPART 100 UNIT/ML IJ SOLN: 0.0000 [IU] | Freq: Every day | INTRAMUSCULAR | Status: DC

## 2021-08-05 MED ORDER — PANTOPRAZOLE SODIUM 40 MG PO TBEC
40.0000 mg | DELAYED_RELEASE_TABLET | Freq: Every day | ORAL | Status: DC
  Administered 2021-08-05: 40 mg via ORAL
  Filled 2021-08-05: qty 1

## 2021-08-05 MED ORDER — FLUTICASONE PROPIONATE 50 MCG/ACT NA SUSP
2.0000 | Freq: Every day | NASAL | Status: DC
  Administered 2021-08-05: 2 via NASAL
  Filled 2021-08-05: qty 16

## 2021-08-05 NOTE — Progress Notes (Signed)
Inpatient Diabetes Program Recommendations ? ?AACE/ADA: New Consensus Statement on Inpatient Glycemic Control (2015) ? ?Target Ranges:  Prepandial:   less than 140 mg/dL ?     Peak postprandial:   less than 180 mg/dL (1-2 hours) ?     Critically ill patients:  140 - 180 mg/dL  ? ?Lab Results  ?Component Value Date  ? GLUCAP 136 (H) 08/05/2021  ? HGBA1C 7.9 (H) 08/05/2021  ? ? ?Review of Glycemic Control ? Latest Reference Range & Units 08/05/21 00:59 08/05/21 02:02 08/05/21 03:00  ?Glucose-Capillary 70 - 99 mg/dL 093 (H) 818 (H) 299 (H)  ? ?Diabetes history: DM 1 ?Outpatient Diabetes medications:  ?Freestyle Libre ?Novolog 1-9 tid with meals ?Lantus 15 units q HS ?Current orders for Inpatient glycemic control:  ?Novolog sensitive tid with meals and HS ?Novolog 3 units tid with meals ?Semglee 18 units daily ? ?Inpatient Diabetes Program Recommendations:   ? ?Agree with orders. Note intractable N&V and likely cause of DKA.   ?Will follow.  ? ?Thanks,  ?Beryl Meager, RN, BC-ADM ?Inpatient Diabetes Coordinator ?Pager [phone]  (8a-5p) ? ? ?

## 2021-08-05 NOTE — Progress Notes (Signed)
This RN provided discharge instructions to patient. All questions answered. All belongings returned. PIVs removed. Patient transferred to Main Entrance via wheelchair by Nurse Tech. Per Nurse tech, patient transferred into to car safely.  ?

## 2021-08-05 NOTE — Plan of Care (Signed)
?  Problem: Respiratory: ?Goal: Will regain and/or maintain adequate ventilation ?Outcome: Progressing ?  ?Problem: Clinical Measurements: ?Goal: Ability to maintain clinical measurements within normal limits will improve ?Outcome: Progressing ?  ?Problem: Coping: ?Goal: Level of anxiety will decrease ?Outcome: Progressing ?  ?Problem: Safety: ?Goal: Ability to remain free from injury will improve ?Outcome: Progressing ?  ?

## 2021-08-06 NOTE — Discharge Summary (Signed)
?Physician Discharge Summary ?  ?Patient: [name] MRN: [id number] DOB: 06-23-1990  ?Admit date:     08/04/2021  ?Discharge date: 08/05/2021  ?Discharge Physician: Berle Mull  ?PCP: Flossie Buffy, NP ? ?Recommendations at discharge: ?Follow-up with PCP in 1 week. ? ?Discharge Diagnoses: ?Principal Problem: ?  Intractable nausea and vomiting ?Active Problems: ?  Diabetic ketoacidosis without coma associated with type 1 diabetes mellitus ([place]) ?  Essential hypertension, benign ? ? ?Hospital Course: ?31 year old female with past medical history of diabetes mellitus type 1, hypertension who presents to [facility] emergency department with complaints of nausea and vomiting.  Found to have DKA and gastritis. ? ?Assessment and Plan: ?DKA without coma in type 1 diabetes mellitus. ?Uncontrolled type 1 diabetes mellitus with long-term insulin use with potential diabetic gastroparesis. ?Intractable nausea and vomiting ?Presents with complaints of nausea and vomiting. ?Potentially may have some gastritis that led to the symptom presentation which eventually caused hyperglycemia and DKA presentation as well. ?DKA physiology has resolved. ?Patient treated with IV fluids and IV insulin therapy. ?Tolerating transition to basal bolus regimen as well as oral diet. ?Currently we will resume home regimen on discharge. ?Recommending outpatient follow-up with PCP. ?As needed Reglan on discharge.  As needed PPI on discharge. ? ?Essential hypertension, benign ?Resume patients home regimen of oral antihypertensives ? ?Consultants: none ?Procedures performed:  ?none ?DISCHARGE MEDICATION: ?Allergies as of 08/05/2021   ? ?   Reactions  ? Aleve [naproxen Sodium] Swelling  ? ?  ? ?  ?Medication List  ?  ? ?STOP taking these medications   ? ?ondansetron 4 MG disintegrating tablet ?Commonly known as: ZOFRAN-ODT ?  ? ?  ? ?TAKE these medications   ? ?acetaminophen 500 MG tablet ?Commonly known as: TYLENOL ?Take 1,000 mg by  mouth every 6 (six) hours as needed (cramps). ?  ?amLODipine 5 MG tablet ?Commonly known as: NORVASC ?Take 5 mg by mouth every morning. ?  ?etonogestrel 68 MG Impl implant ?Commonly known as: NEXPLANON ?1 each by Subdermal route once. Implanted summer 2020 ?  ?fluticasone 50 MCG/ACT nasal spray ?Commonly known as: FLONASE ?Place 2 sprays into both nostrils daily. ?What changed:  ?when to take this ?reasons to take this ?  ?FreeStyle Libre 14 Day Sensor Misc ?by Other route every fourteen (14) days. One libre sensor every 14 days ?  ?insulin glargine 100 UNIT/ML Solostar Pen ?Commonly known as: LANTUS ?Inject 18 Units into the skin at bedtime. ?  ?lisinopril 40 MG tablet ?Commonly known as: ZESTRIL ?Take 40 mg by mouth every morning. ?What changed: Another medication with the same name was removed. Continue taking this medication, and follow the directions you see here. ?  ?metoCLOPramide 5 MG tablet ?Commonly known as: REGLAN ?Take 1 tablet (5 mg total) by mouth every 6 (six) hours as needed for up to 20 doses for nausea or vomiting. ?What changed:  ?medication strength ?how much to take ?when to take this ?reasons to take this ?  ?NovoLOG FlexPen 100 UNIT/ML FlexPen ?Generic drug: insulin aspart ?Inject 5-40 Units into the skin 3 (three) times daily with meals. Dose is based on CBG and carbs (1:8) ?  ?pantoprazole 40 MG tablet ?Commonly known as: PROTONIX ?Take 1 tablet (40 mg total) by mouth daily as needed (heart burn). ?  ?PARoxetine 20 MG tablet ?Commonly known as: PAXIL ?Take 20 mg by mouth every morning. ?  ?polyethylene glycol powder 17 GM/SCOOP powder ?Commonly known as: GLYCOLAX/MIRALAX ?Take 17 g by mouth daily. ?  What changed:  ?when to take this ?reasons to take this ?  ?ProAir RespiClick [id number] (90 Base) MCG/ACT Aepb ?Generic drug: Albuterol Sulfate ?Inhale 1-2 puffs into the lungs every 6 (six) hours as needed. ?What changed: reasons to take this ?  ?ULTRA-THIN II SHORT PEN NEEDLE 31G X 8 MM Misc ?Generic  drug: Insulin Pen Needle ?Inject into the skin. ?  ? ?  ? ? Follow-up Information   ? ? Nche, Charlene Brooke, NP. Schedule an appointment as soon as possible for a visit in 1 week(s).   ?Specialty: Internal Medicine ?Contact information: ?[address] ?[place] [address] ?[phone] ? ? ?  ?  ? ?  ?  ? ?  ? ?Disposition: Home ?Diet recommendation: Carb modified diet ? ?Discharge Exam: ?Vitals:  ? 08/05/21 1200 08/05/21 1300 08/05/21 1400 08/05/21 1500  ?BP:  138/86 136/87   ?Pulse:   (!) 103   ?Resp: 14 14 16 16   ?Temp:      ?TempSrc:      ?SpO2:   100%   ?Weight:      ?Height:      ? ?General: Appear in no distress; no visible Abnormal Neck Mass Or lumps, Conjunctiva normal ?Cardiovascular: S1 and S2 Present, no Murmur, ?Respiratory: good respiratory effort, Bilateral Air entry present and CTA, no Crackles, no wheezes ?Abdomen: Bowel Sound present, Non tender  ?Extremities: no Pedal edema ?Neurology: alert and oriented to time, place, and person ?Gait not checked due to patient safety concerns ?Filed Weights  ? 08/04/21 1920 08/05/21 0020  ?Weight: 54.4 kg 54.7 kg  ? ?Condition at discharge: stable ? ?The results of significant diagnostics from this hospitalization (including imaging, microbiology, ancillary and laboratory) are listed below for reference.  ? ?Imaging Studies: ?DG Chest 1 View ? ?Result Date: 08/04/2021 ?CLINICAL DATA:  Vomiting EXAM: CHEST  1 VIEW COMPARISON:  04/29/2011 FINDINGS: The heart size and mediastinal contours are within normal limits. Both lungs are clear. The visualized skeletal structures are unremarkable. Soft tissue air artifact over the left lateral and lower chest wall. IMPRESSION: No active disease. Electronically Signed   By: Donavan Foil M.D.   On: 08/04/2021 23:41  ? ?CT ABDOMEN PELVIS W CONTRAST ? ?Result Date: 08/04/2021 ?CLINICAL DATA:  Epigastric pain EXAM: CT ABDOMEN AND PELVIS WITH CONTRAST TECHNIQUE: Multidetector CT imaging of the abdomen and pelvis was  performed using the standard protocol following bolus administration of intravenous contrast. RADIATION DOSE REDUCTION: This exam was performed according to the departmental dose-optimization program which includes automated exposure control, adjustment of the mA and/or kV according to patient size and/or use of iterative reconstruction technique. CONTRAST:  148mL OMNIPAQUE IOHEXOL 300 MG/ML  SOLN COMPARISON:  None. FINDINGS: Lower Chest: Normal. Hepatobiliary: Normal hepatic contours. No intra- or extrahepatic biliary dilatation. The gallbladder is normal. Pancreas: Normal pancreas. No ductal dilatation or peripancreatic fluid collection. Spleen: Normal. Adrenals/Urinary Tract: The adrenal glands are normal. No hydronephrosis, nephroureterolithiasis or solid renal mass. The urinary bladder is normal for degree of distention Stomach/Bowel: There is no hiatal hernia. Normal duodenal course and caliber. No small bowel dilatation or inflammation. No focal colonic abnormality. Normal appendix. Vascular/Lymphatic: Normal course and caliber of the major abdominal vessels. No abdominal or pelvic lymphadenopathy. Reproductive: Normal uterus. No adnexal mass. Other: None. Musculoskeletal: No bony spinal canal stenosis or focal osseous abnormality. IMPRESSION: No acute abnormality of the abdomen or pelvis. Electronically Signed   By: Ulyses Jarred M.D.   On: 08/04/2021 20:57   ? ?  Microbiology: ?Results for orders placed or performed during the hospital encounter of 08/04/21  ?MRSA Next Gen by PCR, Nasal     Status: None  ? Collection Time: 08/05/21 12:28 AM  ? Specimen: Nasal Mucosa; Nasal Swab  ?Result Value Ref Range Status  ? MRSA by PCR Next Gen NOT DETECTED NOT DETECTED Final  ?  Comment: (NOTE) ?The GeneXpert MRSA Assay (FDA approved for NASAL specimens only), ?is one component of a comprehensive MRSA colonization surveillance ?program. It is not intended to diagnose MRSA infection nor to guide ?or monitor treatment for  MRSA infections. ?Test performance is not FDA approved in patients less than 2 years ?old. ?Performed at [facility], [address] ?[place], [place] [address] ?  ? ? ?Labs: ?CBC: ?

## 2021-09-04 ENCOUNTER — Encounter (HOSPITAL_COMMUNITY): Payer: Self-pay

## 2021-09-04 ENCOUNTER — Emergency Department (HOSPITAL_COMMUNITY)
Admission: EM | Admit: 2021-09-04 | Discharge: 2021-09-04 | Disposition: A | Discharge status: Home / Self Care | Payer: BC Managed Care – PPO | Attending: Emergency Medicine | Admitting: Emergency Medicine

## 2021-09-04 ENCOUNTER — Other Ambulatory Visit: Payer: Self-pay

## 2021-09-04 DIAGNOSIS — E1065 Type 1 diabetes mellitus with hyperglycemia: Secondary | ICD-10-CM | POA: Insufficient documentation

## 2021-09-04 DIAGNOSIS — Z79899 Other long term (current) drug therapy: Secondary | ICD-10-CM | POA: Insufficient documentation

## 2021-09-04 DIAGNOSIS — Z7951 Long term (current) use of inhaled steroids: Secondary | ICD-10-CM | POA: Insufficient documentation

## 2021-09-04 DIAGNOSIS — Z794 Long term (current) use of insulin: Secondary | ICD-10-CM | POA: Insufficient documentation

## 2021-09-04 DIAGNOSIS — R112 Nausea with vomiting, unspecified: Secondary | ICD-10-CM | POA: Insufficient documentation

## 2021-09-04 DIAGNOSIS — I10 Essential (primary) hypertension: Secondary | ICD-10-CM | POA: Insufficient documentation

## 2021-09-04 DIAGNOSIS — J45909 Unspecified asthma, uncomplicated: Secondary | ICD-10-CM | POA: Diagnosis not present

## 2021-09-04 DIAGNOSIS — R739 Hyperglycemia, unspecified: Secondary | ICD-10-CM

## 2021-09-04 DIAGNOSIS — R1012 Left upper quadrant pain: Secondary | ICD-10-CM | POA: Insufficient documentation

## 2021-09-04 DIAGNOSIS — K3184 Gastroparesis: Secondary | ICD-10-CM

## 2021-09-04 DIAGNOSIS — D72829 Elevated white blood cell count, unspecified: Secondary | ICD-10-CM | POA: Diagnosis not present

## 2021-09-04 DIAGNOSIS — R1084 Generalized abdominal pain: Secondary | ICD-10-CM | POA: Insufficient documentation

## 2021-09-04 LAB — URINALYSIS, ROUTINE W REFLEX MICROSCOPIC
Bacteria, UA: NONE SEEN
Bilirubin Urine: NEGATIVE
Glucose, UA: >=500 mg/dL — AB
Hgb urine dipstick: NEGATIVE
Ketones, ur: 20 mg/dL — AB
Leukocytes,Ua: NEGATIVE
Nitrite: NEGATIVE
Protein, ur: 100 mg/dL — AB
Specific Gravity, Urine: 1.012 (ref 1.005–1.030)
pH: 5 (ref 5.0–8.0)

## 2021-09-04 LAB — COMPREHENSIVE METABOLIC PANEL
ALT: 17 U/L (ref 0–44)
AST: 23 U/L (ref 15–41)
Albumin: 4.7 g/dL (ref 3.5–5.0)
Alkaline Phosphatase: 81 U/L (ref 38–126)
Anion gap: 16 — ABNORMAL HIGH (ref 5–15)
BUN: 27 mg/dL — ABNORMAL HIGH (ref 6–20)
CO2: 20 mmol/L — ABNORMAL LOW (ref 22–32)
Calcium: 10.8 mg/dL — ABNORMAL HIGH (ref 8.9–10.3)
Chloride: 105 mmol/L (ref 98–111)
Creatinine, Ser: 1.39 mg/dL — ABNORMAL HIGH (ref 0.44–1.00)
GFR, Estimated: 52 mL/min — ABNORMAL LOW (ref >60)
Glucose, Bld: 338 mg/dL — ABNORMAL HIGH (ref 70–99)
Potassium: 5 mmol/L (ref 3.5–5.1)
Sodium: 141 mmol/L (ref 135–145)
Total Bilirubin: 1.3 mg/dL — ABNORMAL HIGH (ref 0.3–1.2)
Total Protein: 9.1 g/dL — ABNORMAL HIGH (ref 6.5–8.1)

## 2021-09-04 LAB — CBC
HCT: 39.1 % (ref 36.0–46.0)
Hemoglobin: 13.4 g/dL (ref 12.0–15.0)
MCH: 31.3 pg (ref 26.0–34.0)
MCHC: 34.3 g/dL (ref 30.0–36.0)
MCV: 91.4 fL (ref 80.0–100.0)
Platelets: 392 10*3/uL (ref 150–400)
RBC: 4.28 MIL/uL (ref 3.87–5.11)
RDW: 13 % (ref 11.5–15.5)
WBC: 10.7 10*3/uL — ABNORMAL HIGH (ref 4.0–10.5)
nRBC: 0 % (ref 0.0–0.2)

## 2021-09-04 LAB — CBG MONITORING, ED
Glucose-Capillary: 333 mg/dL — ABNORMAL HIGH (ref 70–99)
Glucose-Capillary: 345 mg/dL — ABNORMAL HIGH (ref 70–99)
Glucose-Capillary: 361 mg/dL — ABNORMAL HIGH (ref 70–99)
Glucose-Capillary: 233 mg/dL — ABNORMAL HIGH (ref 70–99)

## 2021-09-04 LAB — BLOOD GAS, VENOUS
Acid-base deficit: 0.5 mmol/L (ref 0.0–2.0)
Bicarbonate: 24.2 mmol/L (ref 20.0–28.0)
O2 Saturation: 95.9 %
Patient temperature: 37
pCO2, Ven: 39 mmHg — ABNORMAL LOW (ref 44–60)
pH, Ven: 7.4 (ref 7.25–7.43)
pO2, Ven: 70 mmHg — ABNORMAL HIGH (ref 32–45)

## 2021-09-04 LAB — I-STAT BETA HCG BLOOD, ED (MC, WL, AP ONLY): I-stat hCG, quantitative: <5 m[IU]/mL (ref <5)

## 2021-09-04 LAB — LIPASE, BLOOD: Lipase: 20 U/L (ref 11–51)

## 2021-09-04 MED ORDER — PROCHLORPERAZINE EDISYLATE 10 MG/2ML IJ SOLN
10.0000 mg | Freq: Once | INTRAMUSCULAR | Status: AC
  Administered 2021-09-04: 10 mg via INTRAVENOUS
  Filled 2021-09-04: qty 2

## 2021-09-04 MED ORDER — SODIUM CHLORIDE 0.9 % IV BOLUS (SEPSIS)
1000.0000 mL | Freq: Once | INTRAVENOUS | Status: AC
Start: 2021-09-04 — End: 2021-09-04
  Administered 2021-09-04: 1000 mL via INTRAVENOUS

## 2021-09-04 MED ORDER — SODIUM CHLORIDE 0.9 % IV SOLN
1000.0000 mL | INTRAVENOUS | Status: DC
  Administered 2021-09-04: 1000 mL via INTRAVENOUS

## 2021-09-04 MED ORDER — INSULIN ASPART 100 UNIT/ML IJ SOLN
8.0000 [IU] | Freq: Once | INTRAMUSCULAR | Status: AC
Start: 2021-09-04 — End: 2021-09-04
  Administered 2021-09-04: 8 [IU] via SUBCUTANEOUS
  Filled 2021-09-04: qty 0.08

## 2021-09-04 MED ORDER — ONDANSETRON HCL 4 MG/2ML IJ SOLN
INTRAMUSCULAR | Status: AC
  Filled 2021-09-04: qty 2

## 2021-09-04 MED ORDER — MORPHINE SULFATE (PF) 4 MG/ML IV SOLN
4.0000 mg | Freq: Once | INTRAVENOUS | Status: AC
  Administered 2021-09-04: 4 mg via INTRAVENOUS
  Filled 2021-09-04: qty 1

## 2021-09-04 MED ORDER — SODIUM CHLORIDE 0.9 % IV BOLUS
1000.0000 mL | Freq: Once | INTRAVENOUS | Status: AC
  Administered 2021-09-04: 1000 mL via INTRAVENOUS

## 2021-09-04 MED ORDER — ONDANSETRON HCL 4 MG/2ML IJ SOLN
4.0000 mg | Freq: Once | INTRAMUSCULAR | Status: AC
  Administered 2021-09-04: 4 mg via INTRAVENOUS

## 2021-09-04 MED ORDER — ONDANSETRON HCL 4 MG/2ML IJ SOLN
4.0000 mg | Freq: Once | INTRAMUSCULAR | Status: AC
  Administered 2021-09-04: 4 mg via INTRAVENOUS
  Filled 2021-09-04: qty 2

## 2021-09-04 NOTE — ED Provider Notes (Signed)
  Physical Exam  BP 123/89   Pulse (!) 113   Temp 97.8 F (36.6 C) (Oral)   Resp 11   Ht 5\' 4"  (1.626 m)   Wt 54.4 kg   SpO2 100%   BMI 20.60 kg/m     Procedures  Procedures  ED Course / MDM    Medical Decision Making Amount and/or Complexity of Data Reviewed Labs: ordered.  Risk Prescription drug management.   11F, presenting with hyperglycemia, borderline DKA, still working on PO challenge after fluids and insulin. Here with also likely gastroparesis flare.   On repeat assessment, the patient passed a p.o. challenge.  She states that she feels symptomatically improved.  Appears volume resuscitated at this time.  Stable for continued outpatient management.       , MD 09/04/21 [phone]

## 2021-09-04 NOTE — ED Provider Notes (Signed)
Signout from Dr. Eudelia Bunch.  31 year old female history of type 1 diabetes here with nausea vomiting abdominal pain.  History of same.  Labs concerning for elevated glucose low bicarb.  Awaiting VBG and urinalysis.  Probable early DKA.  Probable admission although if he can turn around with fluids and insulin possible discharge. Physical Exam  BP (!) 184/126   Pulse (!) 120   Temp 97.8 F (36.6 C) (Oral)   Resp 13   Ht 5\' 4"  (1.626 m)   Wt 54.4 kg   SpO2 100%   BMI 20.60 kg/m   Physical Exam  Procedures  Procedures  ED Course / MDM    Medical Decision Making Amount and/or Complexity of Data Reviewed Labs: ordered.  Risk Prescription drug management.   11:20 AM.  Patient states she feels a little bit better.  She has been tolerating liquids but is afraid to eat.  She does not want to vomit again.  She has frequent admissions for same.  She said she has an appointment first time with GI next month.  2:30 PM.  Patient has been tolerating fluids had a little bit of chicken broth.  She would like to try some solid food before she decides that she can be discharged. will sign out to oncoming provider.       , MD 09/04/21 [phone]

## 2021-09-04 NOTE — ED Provider Notes (Signed)
[facility]-EMERGENCY DEPT Provider Note  CSN: [id number] Arrival date & time: 09/04/21 5397  Chief Complaint(s) Hyperglycemia  HPI [name] is a 31 y.o. female with a past medical history listed below including type 1 diabetes who presents to the emergency department with 1 day of generalized abdominal discomfort worse in the left upper quadrant with associated nausea and nonbloody nonbilious emesis and inability to tolerate oral intake.  Patient also endorsing elevated blood sugar levels.  States that she still been taking her insulin as prescribed.  She denies any known sick contacts.  No fevers or chills.  No coughing or congestion.  No diarrhea.  No urinary symptoms.  No other physical complaints.  Patient reports that she ran out of her Reglan but states Zofran seems to work better for her.  Patient denied any recent alcohol or marijuana use.  The history is provided by the patient.   Past Medical History Past Medical History:  Diagnosis Date   Asthma    Diabetes mellitus    type 1    Essential hypertension, benign 02/10/2016   Patient Active Problem List   Diagnosis Date Noted   Diabetic ketoacidosis without coma associated with type 1 diabetes mellitus ([facility]) 08/04/2021   Intractable nausea and vomiting 06/23/2020   Uncontrolled type 1 diabetes mellitus with hyperglycemia ([facility])    Hypertensive urgency    Low grade squamous intraepith lesion on cytologic smear cervix (lgsil) 09/22/2019   Constipation 09/06/2019   Essential hypertension, benign 02/10/2016   Generalized abdominal pain    Abdominal pain, epigastric 04/10/2013   Non-intractable vomiting 04/10/2013   Menorrhagia with irregular cycle 11/26/2012   Encounter for Norplant removal 11/26/2012   Hyperglycemia 11/13/2012   Type 1 diabetes mellitus (H[facility]) 11/13/2012   Subclinical hyperthyroidism 07/21/2012   Vitamin D deficiency 12/31/2011   Exercise-induced asthma 10/16/2011   DKA  (diabetic ketoacidoses) 04/29/2011   Noncompliance 04/29/2011   Other chronic cystitis 10/13/2009   Primary hypercoagulable state (HCC) 10/13/2009   Home Medication(s) Prior to Admission medications   Medication Sig Start Date End Date Taking? Authorizing Provider  acetaminophen (TYLENOL) 500 MG tablet Take 1,000 mg by mouth every 6 (six) hours as needed (cramps).    [provider]  Albuterol Sulfate (PROAIR RESPICLICK) 108 (90 Base) MCG/ACT AEPB Inhale 1-2 puffs into the lungs every 6 (six) hours as needed. Patient taking differently: Inhale 1-2 puffs into the lungs every 6 (six) hours as needed (shortness of breath/wheezing). 09/22/19   Nche, Bonna Gains, NP  amLODipine (NORVASC) 5 MG tablet Take 5 mg by mouth every morning. 07/13/21   [provider]  Continuous Blood Gluc Sensor (FREESTYLE LIBRE 14 DAY SENSOR) MISC by Other route every fourteen (14) days. One libre sensor every 14 days 01/21/18   [provider]  etonogestrel (NEXPLANON) 68 MG IMPL implant 1 each by Subdermal route once. Implanted summer 2020    [provider]  fluticasone (FLONASE) 50 MCG/ACT nasal spray Place 2 sprays into both nostrils daily. Patient taking differently: Place 2 sprays into both nostrils daily as needed for allergies or rhinitis. 09/22/19   Nche, Bonna Gains, NP  insulin aspart (NOVOLOG FLEXPEN) 100 UNIT/ML FlexPen Inject 5-40 Units into the skin 3 (three) times daily with meals. Dose is based on CBG and carbs (1:8)    [provider]  insulin glargine (LANTUS) 100 UNIT/ML Solostar Pen Inject 18 Units into the skin at bedtime.    [provider]  Insulin Pen Needle (ULTRA-THIN  II SHORT PEN NEEDLE) 31G X 8 MM MISC Inject into the skin. 01/21/18   [provider]  lisinopril (ZESTRIL) 40 MG tablet Take 40 mg by mouth every morning. 07/13/21   [provider]  metoCLOPramide (REGLAN) 5 MG tablet Take 1 tablet (5 mg total) by mouth every 6  (six) hours as needed for up to 20 doses for nausea or vomiting. 08/05/21   Rolly Salter, MD  pantoprazole (PROTONIX) 40 MG tablet Take 1 tablet (40 mg total) by mouth daily as needed (heart burn). 08/05/21   Rolly Salter, MD  PARoxetine (PAXIL) 20 MG tablet Take 20 mg by mouth every morning. 07/13/21   [provider]  polyethylene glycol powder (GLYCOLAX/MIRALAX) 17 GM/SCOOP powder Take 17 g by mouth daily. Patient taking differently: Take 17 g by mouth daily as needed (constipation). 09/22/19   Nche, Bonna Gains, NP  ranitidine (ZANTAC) 75 MG tablet Take 75 mg by mouth daily as needed for heartburn.  06/15/20  [provider]                                                                                                                                    Allergies Aleve [naproxen sodium]  Review of Systems Review of Systems As noted in HPI  Physical Exam Vital Signs  I have reviewed the triage vital signs BP (!) 154/101 (BP Location: Right Arm)   Pulse (!) 124   Temp 97.8 F (36.6 C) (Oral)   Resp 16   Ht  (1.626 m)   Wt 54.4 kg   SpO2 100%   BMI 20.60 kg/m   Physical Exam Vitals reviewed.  Constitutional:      General: She is not in acute distress.    Appearance: She is well-developed. She is not diaphoretic.  HENT:     Head: Normocephalic and atraumatic.     Right Ear: External ear normal.     Left Ear: External ear normal.     Nose: Nose normal.  Eyes:     General: No scleral icterus.    Conjunctiva/sclera: Conjunctivae normal.  Neck:     Trachea: Phonation normal.  Cardiovascular:     Rate and Rhythm: Normal rate and regular rhythm.  Pulmonary:     Effort: Pulmonary effort is normal. No respiratory distress.     Breath sounds: No stridor.  Abdominal:     General: There is no distension.     Tenderness: There is generalized abdominal tenderness (discomfort). There is no guarding or rebound.  Musculoskeletal:        General: Normal  range of motion.     Cervical back: Normal range of motion.  Neurological:     Mental Status: She is alert and oriented to person, place, and time.  Psychiatric:        Behavior: Behavior normal.    ED Results and Treatments Labs (all labs ordered  are listed, but only abnormal results are displayed) Labs Reviewed  CBC - Abnormal; Notable for the following components:      Result Value   WBC 10.7 (*)    All other components within normal limits  COMPREHENSIVE METABOLIC PANEL - Abnormal; Notable for the following components:   CO2 20 (*)    Glucose, Bld 338 (*)    BUN 27 (*)    Creatinine, Ser 1.39 (*)    Calcium 10.8 (*)    Total Protein 9.1 (*)    Total Bilirubin 1.3 (*)    GFR, Estimated 52 (*)    Anion gap 16 (*)    All other components within normal limits  CBG MONITORING, ED - Abnormal; Notable for the following components:   Glucose-Capillary 333 (*)    All other components within normal limits  CBG MONITORING, ED - Abnormal; Notable for the following components:   Glucose-Capillary 345 (*)    All other components within normal limits  LIPASE, BLOOD  URINALYSIS, ROUTINE W REFLEX MICROSCOPIC  BLOOD GAS, VENOUS  I-STAT BETA HCG BLOOD, ED ([facility], [facility], [facility] ONLY)  CBG MONITORING, ED  CBG MONITORING, ED  CBG MONITORING, ED                                                                                                                         EKG  EKG Interpretation  Date/Time:    Ventricular Rate:    PR Interval:    QRS Duration:   QT Interval:    QTC Calculation:   R Axis:     Text Interpretation:         Radiology No results found.  Pertinent labs & imaging results that were available during my care of the patient were reviewed by me and considered in my medical decision making (see MDM for details).  Medications Ordered in ED Medications  sodium chloride 0.9 % bolus 1,000 mL (1,000 mLs Intravenous New Bag/Given 09/04/21 0622)    Followed by  0.9 %  sodium  chloride infusion (has no administration in time range)  ondansetron (ZOFRAN) 4 MG/2ML injection (has no administration in time range)  insulin aspart (novoLOG) injection 8 Units (has no administration in time range)  sodium chloride 0.9 % bolus 1,000 mL (has no administration in time range)  ondansetron (ZOFRAN) injection 4 mg (4 mg Intravenous Given 09/04/21 0622)  morphine (PF) 4 MG/ML injection 4 mg (4 mg Intravenous Given 09/04/21 0701)  ondansetron (ZOFRAN) injection 4 mg (4 mg Intravenous Given 09/04/21 0702)  Procedures .Critical Care Performed by: Nira Connardama, Jaliyah Fotheringham Eduardo, MD Authorized by: Nira Connardama, Cattaleya Wien Eduardo, MD   Critical care provider statement:    Critical care time (minutes):  45   Critical care time was exclusive of:  Separately billable procedures and treating other patients   Critical care was necessary to treat or prevent imminent or life-threatening deterioration of the following conditions:  Dehydration   Critical care was time spent personally by me on the following activities:  Development of treatment plan with patient or surrogate, discussions with consultants, evaluation of patient's response to treatment, examination of patient, obtaining history from patient or surrogate, review of old charts, re-evaluation of patient's condition, pulse oximetry, ordering and review of radiographic studies, ordering and review of laboratory studies and ordering and performing treatments and interventions   Care discussed with: admitting provider    (including critical care time)  Medical Decision Making / ED Course    Complexity of Problem:  Co-morbidities/SDOH that complicate the patient evaluation/care: Noted above in HPI  Patient's presenting problem/concern, DDX, and MDM listed below: Abdominal pain with nausea and vomiting We will get labs  to assess for any metabolic derangements including DKA, electrolyte derangements, renal sufficiency, pancreatitis, biliary obstruction. Also considering gastroparesis flare We will assess for UTI We will assess for pregnancy related process  Hospitalization Considered:  Yes if evidence of DKA, metabolic derangements and severe dehydration.  Initial Intervention:  Provided with IV fluids, antiemetics.    Complexity of Data:   Cardiac Monitoring: The patient was maintained on a cardiac monitor.   I personally viewed and interpreted the cardiac monitored which showed an underlying rhythm of sinus tachycardia without dysrhythmias or blocks.  Laboratory Tests ordered listed below with my independent interpretation: CBG 333 CBC with mild leukocytosis.  No anemia. Metabolic panel with hyperglycemia and evidence of early DKA.  We will need to confirm with UA and VBG.  Notable for mild hypocalcemia, no other significant electrolyte derangements.  Patient has mild AKI.  No evidence of bili obstruction or pancreatitis. hCG negative    Imaging Studies ordered listed below with my independent interpretation: Deferred for now     ED Course:    Assessment, Add'l Intervention, and Reassessment: Hyperglycemia with abd pain and N/V Being ruled out for DKA Received antiemetics, IV pain medicine. We will give insulin bolus 2 L of IV fluid boluses. Patient care turned over to oncoming provider. Patient case and results discussed in detail; please see their note for further ED managment.   Final Clinical Impression(s) / ED Diagnoses Final diagnoses:  Hyperglycemia  Nausea and vomiting in adult           This chart was dictated using voice recognition software.  Despite best efforts to proofread,  errors can occur which can change the documentation meaning.    Nira Connardama, Jossette Zirbel Eduardo, MD 09/04/21 [phone]

## 2021-09-04 NOTE — ED Triage Notes (Signed)
Pt presents to ED from home with c/o N/V and abdominal pain. Pt states her blood sugar has been high and endorses a history of gastroparesis.

## 2021-09-04 NOTE — ED Notes (Signed)
Urine requested  ?

## 2021-09-08 ENCOUNTER — Other Ambulatory Visit: Payer: Self-pay

## 2021-09-08 ENCOUNTER — Encounter (HOSPITAL_COMMUNITY): Payer: Self-pay

## 2021-09-08 ENCOUNTER — Inpatient Hospital Stay (HOSPITAL_COMMUNITY)
Admission: EM | Admit: 2021-09-08 | Discharge: 2021-09-12 | DRG: 394 | Disposition: A | Discharge status: Home / Self Care | Payer: BC Managed Care – PPO | Attending: Internal Medicine | Admitting: Internal Medicine

## 2021-09-08 DIAGNOSIS — F32A Depression, unspecified: Secondary | ICD-10-CM | POA: Diagnosis present

## 2021-09-08 DIAGNOSIS — Z794 Long term (current) use of insulin: Secondary | ICD-10-CM

## 2021-09-08 DIAGNOSIS — R112 Nausea with vomiting, unspecified: Secondary | ICD-10-CM | POA: Diagnosis present

## 2021-09-08 DIAGNOSIS — F1290 Cannabis use, unspecified, uncomplicated: Secondary | ICD-10-CM | POA: Diagnosis present

## 2021-09-08 DIAGNOSIS — E1010 Type 1 diabetes mellitus with ketoacidosis without coma: Principal | ICD-10-CM

## 2021-09-08 DIAGNOSIS — E1065 Type 1 diabetes mellitus with hyperglycemia: Secondary | ICD-10-CM | POA: Diagnosis present

## 2021-09-08 DIAGNOSIS — R1115 Cyclical vomiting syndrome unrelated to migraine: Secondary | ICD-10-CM | POA: Diagnosis not present

## 2021-09-08 DIAGNOSIS — D649 Anemia, unspecified: Secondary | ICD-10-CM | POA: Diagnosis not present

## 2021-09-08 DIAGNOSIS — K219 Gastro-esophageal reflux disease without esophagitis: Secondary | ICD-10-CM | POA: Diagnosis not present

## 2021-09-08 DIAGNOSIS — N179 Acute kidney failure, unspecified: Secondary | ICD-10-CM | POA: Diagnosis not present

## 2021-09-08 DIAGNOSIS — I10 Essential (primary) hypertension: Secondary | ICD-10-CM | POA: Diagnosis not present

## 2021-09-08 DIAGNOSIS — E109 Type 1 diabetes mellitus without complications: Principal | ICD-10-CM

## 2021-09-08 DIAGNOSIS — Z79899 Other long term (current) drug therapy: Secondary | ICD-10-CM

## 2021-09-08 DIAGNOSIS — F17210 Nicotine dependence, cigarettes, uncomplicated: Secondary | ICD-10-CM | POA: Diagnosis present

## 2021-09-08 DIAGNOSIS — F1210 Cannabis abuse, uncomplicated: Secondary | ICD-10-CM | POA: Diagnosis present

## 2021-09-08 DIAGNOSIS — E876 Hypokalemia: Secondary | ICD-10-CM | POA: Diagnosis not present

## 2021-09-08 LAB — URINALYSIS, ROUTINE W REFLEX MICROSCOPIC
Bilirubin Urine: NEGATIVE
Glucose, UA: >=500 mg/dL — AB
Hgb urine dipstick: NEGATIVE
Ketones, ur: 20 mg/dL — AB
Leukocytes,Ua: NEGATIVE
Nitrite: NEGATIVE
Protein, ur: 100 mg/dL — AB
Specific Gravity, Urine: 1.012 (ref 1.005–1.030)
pH: 5 (ref 5.0–8.0)

## 2021-09-08 LAB — BASIC METABOLIC PANEL
Anion gap: 13 (ref 5–15)
BUN: 21 mg/dL — ABNORMAL HIGH (ref 6–20)
CO2: 20 mmol/L — ABNORMAL LOW (ref 22–32)
Calcium: 10.5 mg/dL — ABNORMAL HIGH (ref 8.9–10.3)
Chloride: 107 mmol/L (ref 98–111)
Creatinine, Ser: 0.97 mg/dL (ref 0.44–1.00)
GFR, Estimated: >60 mL/min (ref >60)
Glucose, Bld: 283 mg/dL — ABNORMAL HIGH (ref 70–99)
Potassium: 4.1 mmol/L (ref 3.5–5.1)
Sodium: 140 mmol/L (ref 135–145)

## 2021-09-08 LAB — CBG MONITORING, ED
Glucose-Capillary: 144 mg/dL — ABNORMAL HIGH (ref 70–99)
Glucose-Capillary: 218 mg/dL — ABNORMAL HIGH (ref 70–99)
Glucose-Capillary: 273 mg/dL — ABNORMAL HIGH (ref 70–99)
Glucose-Capillary: 285 mg/dL — ABNORMAL HIGH (ref 70–99)

## 2021-09-08 LAB — LIPASE, BLOOD: Lipase: 22 U/L (ref 11–51)

## 2021-09-08 LAB — CBC
HCT: 37.1 % (ref 36.0–46.0)
Hemoglobin: 13.2 g/dL (ref 12.0–15.0)
MCH: 31.7 pg (ref 26.0–34.0)
MCHC: 35.6 g/dL (ref 30.0–36.0)
MCV: 89 fL (ref 80.0–100.0)
Platelets: 352 10*3/uL (ref 150–400)
RBC: 4.17 MIL/uL (ref 3.87–5.11)
RDW: 12.6 % (ref 11.5–15.5)
WBC: 11.5 10*3/uL — ABNORMAL HIGH (ref 4.0–10.5)
nRBC: 0 % (ref 0.0–0.2)

## 2021-09-08 LAB — I-STAT BETA HCG BLOOD, ED (MC, WL, AP ONLY): I-stat hCG, quantitative: <5 m[IU]/mL (ref <5)

## 2021-09-08 LAB — HEPATIC FUNCTION PANEL
ALT: 14 U/L (ref 0–44)
AST: 14 U/L — ABNORMAL LOW (ref 15–41)
Albumin: 4.4 g/dL (ref 3.5–5.0)
Alkaline Phosphatase: 71 U/L (ref 38–126)
Bilirubin, Direct: <0.1 mg/dL (ref 0.0–0.2)
Total Bilirubin: 0.9 mg/dL (ref 0.3–1.2)
Total Protein: 8.4 g/dL — ABNORMAL HIGH (ref 6.5–8.1)

## 2021-09-08 LAB — BLOOD GAS, VENOUS
Acid-base deficit: 0.2 mmol/L (ref 0.0–2.0)
Bicarbonate: 22.6 mmol/L (ref 20.0–28.0)
O2 Saturation: 95.9 %
Patient temperature: 37
pCO2, Ven: 31 mmHg — ABNORMAL LOW (ref 44–60)
pH, Ven: 7.47 — ABNORMAL HIGH (ref 7.25–7.43)
pO2, Ven: 71 mmHg — ABNORMAL HIGH (ref 32–45)

## 2021-09-08 LAB — BETA-HYDROXYBUTYRIC ACID: Beta-Hydroxybutyric Acid: 2.9 mmol/L — ABNORMAL HIGH (ref 0.05–0.27)

## 2021-09-08 MED ORDER — METOCLOPRAMIDE HCL 5 MG/ML IJ SOLN
10.0000 mg | Freq: Three times a day (TID) | INTRAMUSCULAR | Status: DC
  Administered 2021-09-09 (×2): 10 mg via INTRAVENOUS
  Filled 2021-09-08 (×2): qty 2

## 2021-09-08 MED ORDER — ONDANSETRON HCL 4 MG PO TABS: 4.0000 mg | ORAL_TABLET | Freq: Four times a day (QID) | ORAL | Status: DC | PRN

## 2021-09-08 MED ORDER — LACTATED RINGERS IV SOLN: INTRAVENOUS | Status: DC

## 2021-09-08 MED ORDER — INSULIN ASPART 100 UNIT/ML IJ SOLN
0.0000 [IU] | INTRAMUSCULAR | Status: DC
  Administered 2021-09-08: 3 [IU] via SUBCUTANEOUS
  Administered 2021-09-08: 1 [IU] via SUBCUTANEOUS
  Administered 2021-09-09: 7 [IU] via SUBCUTANEOUS
  Administered 2021-09-09 (×3): 1 [IU] via SUBCUTANEOUS
  Administered 2021-09-10: 5 [IU] via SUBCUTANEOUS
  Administered 2021-09-10 (×2): 7 [IU] via SUBCUTANEOUS
  Filled 2021-09-08: qty 0.09

## 2021-09-08 MED ORDER — INSULIN GLARGINE-YFGN 100 UNIT/ML ~~LOC~~ SOLN
10.0000 [IU] | Freq: Every day | SUBCUTANEOUS | Status: DC
  Administered 2021-09-08 – 2021-09-11 (×4): 10 [IU] via SUBCUTANEOUS
  Filled 2021-09-08 (×6): qty 0.1

## 2021-09-08 MED ORDER — FLUTICASONE PROPIONATE 50 MCG/ACT NA SUSP: 2.0000 | Freq: Every day | NASAL | Status: DC | PRN

## 2021-09-08 MED ORDER — ALUM & MAG HYDROXIDE-SIMETH 200-200-20 MG/5ML PO SUSP
30.0000 mL | Freq: Once | ORAL | Status: AC
  Administered 2021-09-08: 30 mL via ORAL
  Filled 2021-09-08: qty 30

## 2021-09-08 MED ORDER — LACTATED RINGERS IV BOLUS
1000.0000 mL | Freq: Once | INTRAVENOUS | Status: AC
Start: 2021-09-08 — End: 2021-09-08
  Administered 2021-09-08: 1000 mL via INTRAVENOUS

## 2021-09-08 MED ORDER — LIDOCAINE VISCOUS HCL 2 % MT SOLN
15.0000 mL | Freq: Once | OROMUCOSAL | Status: AC
  Administered 2021-09-08: 15 mL via ORAL
  Filled 2021-09-08: qty 15

## 2021-09-08 MED ORDER — PROCHLORPERAZINE EDISYLATE 10 MG/2ML IJ SOLN
10.0000 mg | Freq: Once | INTRAMUSCULAR | Status: AC
  Administered 2021-09-08: 10 mg via INTRAVENOUS
  Filled 2021-09-08: qty 2

## 2021-09-08 MED ORDER — ALUM & MAG HYDROXIDE-SIMETH 200-200-20 MG/5ML PO SUSP
30.0000 mL | Freq: Once | ORAL | Status: AC
  Administered 2021-09-09: 30 mL via ORAL
  Filled 2021-09-08: qty 30

## 2021-09-08 MED ORDER — ONDANSETRON HCL 4 MG/2ML IJ SOLN
4.0000 mg | Freq: Four times a day (QID) | INTRAMUSCULAR | Status: DC | PRN
  Administered 2021-09-10 (×2): 4 mg via INTRAVENOUS
  Filled 2021-09-08 (×2): qty 2

## 2021-09-08 MED ORDER — LACTATED RINGERS IV BOLUS
1000.0000 mL | Freq: Once | INTRAVENOUS | Status: AC
  Administered 2021-09-08: 1000 mL via INTRAVENOUS

## 2021-09-08 MED ORDER — PAROXETINE HCL 20 MG PO TABS
20.0000 mg | ORAL_TABLET | Freq: Every evening | ORAL | Status: DC
  Administered 2021-09-09 – 2021-09-11 (×4): 20 mg via ORAL
  Filled 2021-09-08 (×4): qty 1

## 2021-09-08 MED ORDER — LIDOCAINE VISCOUS HCL 2 % MT SOLN
15.0000 mL | Freq: Once | OROMUCOSAL | Status: DC
  Filled 2021-09-08: qty 15

## 2021-09-08 MED ORDER — ENOXAPARIN SODIUM 40 MG/0.4ML IJ SOSY
40.0000 mg | PREFILLED_SYRINGE | Freq: Every day | INTRAMUSCULAR | Status: DC
  Administered 2021-09-09 – 2021-09-11 (×3): 40 mg via SUBCUTANEOUS
  Filled 2021-09-08 (×3): qty 0.4

## 2021-09-08 MED ORDER — LISINOPRIL 10 MG PO TABS
40.0000 mg | ORAL_TABLET | Freq: Every morning | ORAL | Status: DC
Start: 2021-09-09 — End: 2021-09-11
  Administered 2021-09-09 – 2021-09-10 (×2): 40 mg via ORAL
  Filled 2021-09-08 (×3): qty 4

## 2021-09-08 MED ORDER — ACETAMINOPHEN 325 MG PO TABS
650.0000 mg | ORAL_TABLET | Freq: Four times a day (QID) | ORAL | Status: DC | PRN
  Administered 2021-09-10: 650 mg via ORAL
  Filled 2021-09-08: qty 2

## 2021-09-08 MED ORDER — PANTOPRAZOLE SODIUM 40 MG PO TBEC
40.0000 mg | DELAYED_RELEASE_TABLET | Freq: Every day | ORAL | Status: DC
  Administered 2021-09-09 – 2021-09-10 (×3): 40 mg via ORAL
  Filled 2021-09-08 (×3): qty 1

## 2021-09-08 MED ORDER — ACETAMINOPHEN 650 MG RE SUPP: 650.0000 mg | Freq: Four times a day (QID) | RECTAL | Status: DC | PRN

## 2021-09-08 MED ORDER — METOCLOPRAMIDE HCL 5 MG/ML IJ SOLN: 10.0000 mg | Freq: Three times a day (TID) | INTRAMUSCULAR | Status: DC

## 2021-09-08 MED ORDER — METOCLOPRAMIDE HCL 5 MG/ML IJ SOLN
10.0000 mg | Freq: Once | INTRAMUSCULAR | Status: AC
  Administered 2021-09-08: 10 mg via INTRAVENOUS
  Filled 2021-09-08: qty 2

## 2021-09-08 MED ORDER — PANTOPRAZOLE SODIUM 40 MG PO TBEC: 40.0000 mg | DELAYED_RELEASE_TABLET | Freq: Every day | ORAL | Status: DC | PRN

## 2021-09-08 MED ORDER — AMLODIPINE BESYLATE 5 MG PO TABS
5.0000 mg | ORAL_TABLET | Freq: Every evening | ORAL | Status: DC
Start: 2021-09-09 — End: 2021-09-12
  Administered 2021-09-09 – 2021-09-11 (×4): 5 mg via ORAL
  Filled 2021-09-08 (×4): qty 1

## 2021-09-08 NOTE — Assessment & Plan Note (Signed)
Cont home BP meds ?

## 2021-09-08 NOTE — Assessment & Plan Note (Addendum)
Convert protonix to scheduled daily (from PRN currently), first dose tonight. GI cocktail prn

## 2021-09-08 NOTE — ED Provider Notes (Signed)
[facility]-EMERGENCY DEPT Provider Note   CSN: [id number] Arrival date & time: 09/08/21  1640     History  Chief Complaint  Patient presents with   Hyperglycemia   Emesis   Abdominal Pain    [name] is a 31 y.o. female.   Hyperglycemia Associated symptoms: abdominal pain and vomiting   Emesis Associated symptoms: abdominal pain   Abdominal Pain Associated symptoms: vomiting   Patient presents with nausea and vomiting.  Abdominal pain.  History of type 1 diabetes.  Sugars been somewhat poorly controlled today but states they have been doing better overall.  Began to feel worse last night.  More vomiting today.  Pain into her chest abdomen.  Typical for episodes when she flares up.  Reviewing records it appears as if she had a recent gastric emptying study that did not show gastroparesis.  Has recently been on Reglan but states she ran out.  Pain is dull.  No fevers.  States she has a history of GERD and feels that that is acting up also for    Home Medications Prior to Admission medications   Medication Sig Start Date End Date Taking? Authorizing Provider  acetaminophen (TYLENOL) 500 MG tablet Take 1,000 mg by mouth every 6 (six) hours as needed (cramps).   Yes [provider]  amLODipine (NORVASC) 5 MG tablet Take 5 mg by mouth every evening. 07/13/21  Yes [provider]  etonogestrel (NEXPLANON) 68 MG IMPL implant 1 each by Subdermal route once. Implanted summer 2020   Yes [provider]  fluticasone (FLONASE) 50 MCG/ACT nasal spray Place 2 sprays into both nostrils daily. Patient taking differently: Place 2 sprays into both nostrils daily as needed for allergies or rhinitis. 09/22/19  Yes Nche, Bonna Gains, NP  insulin aspart (NOVOLOG FLEXPEN) 100 UNIT/ML FlexPen Inject 5-40 Units into the skin 3 (three) times daily with meals. Dose is based on CBG and carbs (1:8)   Yes [provider]  insulin glargine (LANTUS)  100 UNIT/ML Solostar Pen Inject 18 Units into the skin at bedtime.   Yes [provider]  lisinopril (ZESTRIL) 40 MG tablet Take 40 mg by mouth every morning. 07/13/21  Yes [provider]  pantoprazole (PROTONIX) 40 MG tablet Take 1 tablet (40 mg total) by mouth daily as needed (heart burn). 08/05/21  Yes Rolly Salter, MD  PARoxetine (PAXIL) 20 MG tablet Take 20 mg by mouth every evening. 07/13/21  Yes [provider]  Albuterol Sulfate (PROAIR RESPICLICK) 108 (90 Base) MCG/ACT AEPB Inhale 1-2 puffs into the lungs every 6 (six) hours as needed. Patient not taking: Reported on 09/04/2021 09/22/19   Nche, Bonna Gains, NP  Continuous Blood Gluc Sensor (FREESTYLE LIBRE 14 DAY SENSOR) MISC by Other route every fourteen (14) days. One libre sensor every 14 days 01/21/18   [provider]  Insulin Pen Needle (ULTRA-THIN II SHORT PEN NEEDLE) 31G X 8 MM MISC Inject into the skin. 01/21/18   [provider]  metoCLOPramide (REGLAN) 5 MG tablet Take 1 tablet (5 mg total) by mouth every 6 (six) hours as needed for up to 20 doses for nausea or vomiting. Patient not taking: Reported on 09/04/2021 08/05/21   Rolly Salter, MD  polyethylene glycol powder [facility]) 17 GM/SCOOP powder Take 17 g by mouth daily. Patient not taking: Reported on 09/04/2021 09/22/19   Nche, Bonna Gains, NP  ranitidine (ZANTAC) 75 MG tablet Take 75 mg by mouth daily as needed for  heartburn.  06/15/20  [provider]      Allergies    Aleve [naproxen sodium]    Review of Systems   Review of Systems  Gastrointestinal:  Positive for abdominal pain and vomiting.   Physical Exam Updated Vital Signs BP (!) 197/105   Pulse (!) 124   Temp 97.7 F (36.5 C) (Oral)   Resp 16   Ht 5\' 4"  (1.626 m)   Wt 54.4 kg   SpO2 99%   BMI 20.60 kg/m  Physical Exam Eyes:     Pupils: Pupils are equal, round, and reactive to light.  Cardiovascular:     Rate and Rhythm: Tachycardia  present.  Pulmonary:     Breath sounds: Normal breath sounds.  Abdominal:     Hernia: No hernia is present.     Comments: Epigastric tenderness without rebound or guarding.  No hernia palpated.  Skin:    General: Skin is warm.     Capillary Refill: Capillary refill takes less than 2 seconds.  Neurological:     Mental Status: She is alert.    ED Results / Procedures / Treatments   Labs (all labs ordered are listed, but only abnormal results are displayed) Labs Reviewed  BASIC METABOLIC PANEL - Abnormal; Notable for the following components:      Result Value   CO2 20 (*)    Glucose, Bld 283 (*)    BUN 21 (*)    Calcium 10.5 (*)    All other components within normal limits  CBC - Abnormal; Notable for the following components:   WBC 11.5 (*)    All other components within normal limits  URINALYSIS, ROUTINE W REFLEX MICROSCOPIC - Abnormal; Notable for the following components:   APPearance HAZY (*)    Glucose, UA >=500 (*)    Ketones, ur 20 (*)    Protein, ur 100 (*)    Bacteria, UA RARE (*)    All other components within normal limits  HEPATIC FUNCTION PANEL - Abnormal; Notable for the following components:   Total Protein 8.4 (*)    AST 14 (*)    All other components within normal limits  BLOOD GAS, VENOUS - Abnormal; Notable for the following components:   pH, Ven 7.47 (*)    pCO2, Ven 31 (*)    pO2, Ven 71 (*)    All other components within normal limits  BETA-HYDROXYBUTYRIC ACID - Abnormal; Notable for the following components:   Beta-Hydroxybutyric Acid 2.90 (*)    All other components within normal limits  CBG MONITORING, ED - Abnormal; Notable for the following components:   Glucose-Capillary 273 (*)    All other components within normal limits  CBG MONITORING, ED - Abnormal; Notable for the following components:   Glucose-Capillary 285 (*)    All other components within normal limits  CBG MONITORING, ED - Abnormal; Notable for the following components:    Glucose-Capillary 218 (*)    All other components within normal limits  LIPASE, BLOOD  RAPID URINE DRUG SCREEN, HOSP PERFORMED  CBC  BASIC METABOLIC PANEL  I-STAT BETA HCG BLOOD, ED ([facility], [facility], [facility] ONLY)  CBG MONITORING, ED    EKG None  Radiology No results found.  Procedures Procedures    Medications Ordered in ED Medications  insulin aspart (novoLOG) injection 0-9 Units (3 Units Subcutaneous Given 09/08/21 2112)  insulin glargine-yfgn (SEMGLEE) injection 10 Units (has no administration in time range)  enoxaparin (LOVENOX) injection 40 mg (40 mg Subcutaneous Patient  Refused/Not Given 09/08/21 2254)  acetaminophen (TYLENOL) tablet 650 mg (has no administration in time range)    Or  acetaminophen (TYLENOL) suppository 650 mg (has no administration in time range)  ondansetron (ZOFRAN) tablet 4 mg (has no administration in time range)    Or  ondansetron (ZOFRAN) injection 4 mg (has no administration in time range)  lactated ringers infusion ( Intravenous New Bag/Given 09/08/21 2302)  fluticasone (FLONASE) 50 MCG/ACT nasal spray 2 spray (has no administration in time range)  amLODipine (NORVASC) tablet 5 mg (has no administration in time range)  lisinopril (ZESTRIL) tablet 40 mg (has no administration in time range)  PARoxetine (PAXIL) tablet 20 mg (has no administration in time range)  alum & mag hydroxide-simeth (MAALOX/MYLANTA) 200-200-20 MG/5ML suspension 30 mL (has no administration in time range)    And  lidocaine (XYLOCAINE) 2 % viscous mouth solution 15 mL (has no administration in time range)  pantoprazole (PROTONIX) EC tablet 40 mg (has no administration in time range)  metoCLOPramide (REGLAN) injection 10 mg (has no administration in time range)  metoCLOPramide (REGLAN) injection 10 mg (10 mg Intravenous Given 09/08/21 1738)  lactated ringers bolus 1,000 mL (0 mLs Intravenous Stopped 09/08/21 1905)  alum & mag hydroxide-simeth (MAALOX/MYLANTA) 200-200-20 MG/5ML suspension 30  mL (30 mLs Oral Given 09/08/21 1745)    And  lidocaine (XYLOCAINE) 2 % viscous mouth solution 15 mL (15 mLs Oral Given 09/08/21 1745)  lactated ringers bolus 1,000 mL (0 mLs Intravenous Stopped 09/08/21 2050)  prochlorperazine (COMPAZINE) injection 10 mg (10 mg Intravenous Given 09/08/21 1959)    ED Course/ Medical Decision Making/ A&P                           Medical Decision Making Amount and/or Complexity of Data Reviewed Labs: ordered.  Risk OTC drugs. Prescription drug management. Decision regarding hospitalization.   Patient with nausea vomit abdominal pain.  High blood sugars.  History of DKA.  Is diabetic.  Lab work reviewed.  Appears she may be in a DKA now.  Has a anion gap of only 13, however bicarb is 20 and beta-hydroxybutyrate is elevated 2.9.  Has had Reglan and Compazine and still vomiting.  CBG has improved some but now still above 200.  Fluid boluses given and remains tachycardic.  Urine does show 20 ketones.  May be component of cannabinol hyperemesis.  Also dehydration likely contributing.  With continued vomiting in the ketones require admission to the hospital for fluids insulin and clearing her ketones.  Will discuss with hospitalist for admission   CRITICAL CARE Performed by: Benjiman CoreNathan Matteo Banke Total critical care time: 30 minutes Critical care time was exclusive of separately billable procedures and treating other patients. Critical care was necessary to treat or prevent imminent or life-threatening deterioration. Critical care was time spent personally by me on the following activities: development of treatment plan with patient and/or surrogate as well as nursing, discussions with consultants, evaluation of patient's response to treatment, examination of patient, obtaining history from patient or surrogate, ordering and performing treatments and interventions, ordering and review of laboratory studies, ordering and review of radiographic studies, pulse oximetry and  re-evaluation of patient's condition.         Final Clinical Impression(s) / ED Diagnoses Final diagnoses:  Type 1 diabetes mellitus with ketoacidosis without coma (HCC)    Rx / DC Orders ED Discharge Orders     None  Benjiman Core, MD 09/08/21 [phone]

## 2021-09-08 NOTE — H&P (Signed)
History and Physical    Patient: [name] ZHY:[id number] DOB: [birth year] DOA: 09/08/2021 DOS: the patient was seen and examined on 09/08/2021 PCP: Anne NgNche, Charlotte Lum, NP  Patient coming from: Home  Chief Complaint:  Chief Complaint  Patient presents with   Hyperglycemia   Emesis   Abdominal Pain   HPI: [name] is a 31 y.o. female with medical history significant of DM1, asthma, HTN.  Pt has h/o cyclic vomiting syndrome.  Previously felt to be due to canabis abuse.  Had neg gastric emptying study for diabetic gastroparesis in Feb 2023.  Pt presents to ED with hyperglycemia, emesis, and abd pain.  Began to feel worse last night.  Typical for prior episodes when she flares up.  Had been on reglan recently but ran out.  No fevers.  Pt admits to recent cannabis use, most recently within past couple of weeks (used in interim since seeing her endocrinologist last month).  Review of Systems: As mentioned in the history of present illness. All other systems reviewed and are negative. Past Medical History:  Diagnosis Date   Asthma    Diabetes mellitus    type 1    Essential hypertension, benign 02/10/2016   History reviewed. No pertinent surgical history. Social History:  reports that she has been smoking cigarettes. She uses smokeless tobacco. She reports current drug use. Drug: Marijuana. She reports that she does not drink alcohol.  Allergies  Allergen Reactions   Aleve [Naproxen Sodium] Swelling    Family History  Problem Relation Age of Onset   Hypertension Paternal Grandmother     Prior to Admission medications   Medication Sig Start Date End Date Taking? Authorizing Provider  acetaminophen (TYLENOL) 500 MG tablet Take 1,000 mg by mouth every 6 (six) hours as needed (cramps).   Yes [provider]  amLODipine (NORVASC) 5 MG tablet Take 5 mg by mouth every evening. 07/13/21  Yes [provider]  etonogestrel (NEXPLANON) 68 MG IMPL  implant 1 each by Subdermal route once. Implanted summer 2020   Yes [provider]  fluticasone (FLONASE) 50 MCG/ACT nasal spray Place 2 sprays into both nostrils daily. Patient taking differently: Place 2 sprays into both nostrils daily as needed for allergies or rhinitis. 09/22/19  Yes Nche, Bonna Gainsharlotte Lum, NP  insulin aspart (NOVOLOG FLEXPEN) 100 UNIT/ML FlexPen Inject 5-40 Units into the skin 3 (three) times daily with meals. Dose is based on CBG and carbs (1:8)   Yes [provider]  insulin glargine (LANTUS) 100 UNIT/ML Solostar Pen Inject 18 Units into the skin at bedtime.   Yes [provider]  lisinopril (ZESTRIL) 40 MG tablet Take 40 mg by mouth every morning. 07/13/21  Yes [provider]  pantoprazole (PROTONIX) 40 MG tablet Take 1 tablet (40 mg total) by mouth daily as needed (heart burn). 08/05/21  Yes Rolly SalterPatel, Pranav M, MD  PARoxetine (PAXIL) 20 MG tablet Take 20 mg by mouth every evening. 07/13/21  Yes [provider]  Albuterol Sulfate (PROAIR RESPICLICK) 108 (90 Base) MCG/ACT AEPB Inhale 1-2 puffs into the lungs every 6 (six) hours as needed. Patient not taking: Reported on 09/04/2021 09/22/19   Nche, Bonna Gainsharlotte Lum, NP  Continuous Blood Gluc Sensor (FREESTYLE LIBRE 14 DAY SENSOR) MISC by Other route every fourteen (14) days. One libre sensor every 14 days 01/21/18   [provider]  Insulin Pen Needle (ULTRA-THIN II SHORT PEN NEEDLE) 31G X 8 MM MISC Inject into the skin. 01/21/18   [provider]  metoCLOPramide (REGLAN) 5 MG tablet Take 1 tablet (5 mg total) by mouth every 6 (six) hours as needed for up to 20 doses for nausea or vomiting. Patient not taking: Reported on 09/04/2021 08/05/21   Rolly Salter, MD  polyethylene glycol powder [facility]) 17 GM/SCOOP powder Take 17 g by mouth daily. Patient not taking: Reported on 09/04/2021 09/22/19   Nche, Bonna Gains, NP  ranitidine (ZANTAC) 75 MG tablet Take 75 mg by mouth  daily as needed for heartburn.  06/15/20  [provider]    Physical Exam: Vitals:   09/08/21 1646 09/08/21 1908 09/08/21 2000 09/08/21 2041  BP: (!) 177/114 (!) 172/100 (!) 195/103 (!) 197/105  Pulse: (!) 120 (!) 138  (!) 124  Resp: 18 20  16   Temp: 97.7 F (36.5 C)     TempSrc: Oral     SpO2: 100% 99%  99%  Weight:      Height:       Constitutional: NAD, calm, comfortable Eyes: PERRL, lids and conjunctivae normal ENMT: Mucous membranes are moist. Posterior pharynx clear of any exudate or lesions.Normal dentition.  Neck: normal, supple, no masses, no thyromegaly Respiratory: clear to auscultation bilaterally, no wheezing, no crackles. Normal respiratory effort. No accessory muscle use.  Cardiovascular: Tachycardic no murmurs / rubs / gallops. No extremity edema. 2+ pedal pulses. No carotid bruits.  Abdomen: Epigastric TTP, no guarding no rebound Musculoskeletal: no clubbing / cyanosis. No joint deformity upper and lower extremities. Good ROM, no contractures. Normal muscle tone.  Skin: no rashes, lesions, ulcers. No induration Neurologic: CN 2-12 grossly intact. Sensation intact, DTR normal. Strength 5/5 in all 4.  Psychiatric: Normal judgment and insight. Alert and oriented x 3. Normal mood.   Data Reviewed:    CMP     Component Value Date/Time   NA 140 09/08/2021 1729   K 4.1 09/08/2021 1729   CL 107 09/08/2021 1729   CO2 20 (L) 09/08/2021 1729   GLUCOSE 283 (H) 09/08/2021 1729   BUN 21 (H) 09/08/2021 1729   CREATININE 0.97 09/08/2021 1729   CALCIUM 10.5 (H) 09/08/2021 1729   PROT 8.4 (H) 09/08/2021 1729   ALBUMIN 4.4 09/08/2021 1729   AST 14 (L) 09/08/2021 1729   ALT 14 09/08/2021 1729   ALKPHOS 71 09/08/2021 1729   BILITOT 0.9 09/08/2021 1729   GFRNONAA >60 09/08/2021 1729   GFRAA >60 08/01/2016 1317   Venous Blood Gas result:  pCO2 31; pH 7.47;  HCO3 22.6   Assessment and Plan: * Cannabinoid hyperemesis syndrome Cyclic vomiting syndrome with  intractable nausea and vomiting: Most likely cannabinoid hyperemesis syndrome: 1) negative gastric emptying study for diabetic gastroparesis in Feb, this is likely a TRUE negative as pt reports she had been off of reglan for ~1 month prior to the study being performed. 2) previously suspected to have this 3) pt admits to ongoing / recent use within past couple of weeks.  Plan: 1) UDS pending - though she admits to recent use within the past couple of weeks. 2) IVF 3) NPO 4) got reglan in ED, will order scheduled 5) PRN zofran 6) consider PRN haldol for vomiting 7) avoid narcotics 8) GI cocktail PRN  Uncontrolled type 1 diabetes mellitus with hyperglycemia (HCC) No DKA today AG 13. PH = actually slightly alkalotic Lantus 10u QHS for the moment (normally takes 18 at home) Sensitive SSI Q4H  Essential hypertension, benign Cont home BP meds  GERD (gastroesophageal reflux disease) Convert protonix to scheduled  daily (from PRN currently), first dose tonight. GI cocktail prn      Advance Care Planning:   Code Status: Full Code  Consults: None  Family Communication: No family in room  Severity of Illness: The appropriate patient status for this patient is OBSERVATION. Observation status is judged to be reasonable and necessary in order to provide the required intensity of service to ensure the patient's safety. The patient's presenting symptoms, physical exam findings, and initial radiographic and laboratory data in the context of their medical condition is felt to place them at decreased risk for further clinical deterioration. Furthermore, it is anticipated that the patient will be medically stable for discharge from the hospital within 2 midnights of admission.   Author: Hillary Bow., DO 09/08/2021 9:24 PM  For on call review [email or web address].

## 2021-09-08 NOTE — Assessment & Plan Note (Signed)
No DKA today AG 13. PH = actually slightly alkalotic 1. Lantus 10u QHS for the moment (normally takes 18 at home) 2. Sensitive SSI Q4H

## 2021-09-08 NOTE — Assessment & Plan Note (Addendum)
Cyclic vomiting syndrome with intractable nausea and vomiting: Most likely cannabinoid hyperemesis syndrome: 1) negative gastric emptying study for diabetic gastroparesis in Feb, this is likely a TRUE negative as pt reports she had been off of reglan for ~1 month prior to the study being performed. 2) previously suspected to have this 3) pt admits to ongoing / recent use within past couple of weeks.  Plan: 1) UDS pending - though she admits to recent use within the past couple of weeks. 2) IVF 3) NPO 4) got reglan in ED, will order scheduled 5) PRN zofran 6) consider PRN haldol for vomiting 7) avoid narcotics 8) GI cocktail PRN

## 2021-09-08 NOTE — ED Triage Notes (Signed)
Patient c/o generalized abdominal pain, N/v and states her blood sugars have been in the 300-400 range today at home. CBG in triage-273.

## 2021-09-09 DIAGNOSIS — R112 Nausea with vomiting, unspecified: Secondary | ICD-10-CM | POA: Diagnosis present

## 2021-09-09 DIAGNOSIS — F1290 Cannabis use, unspecified, uncomplicated: Secondary | ICD-10-CM | POA: Diagnosis not present

## 2021-09-09 DIAGNOSIS — F1210 Cannabis abuse, uncomplicated: Secondary | ICD-10-CM | POA: Diagnosis present

## 2021-09-09 DIAGNOSIS — N179 Acute kidney failure, unspecified: Secondary | ICD-10-CM | POA: Diagnosis not present

## 2021-09-09 DIAGNOSIS — D649 Anemia, unspecified: Secondary | ICD-10-CM | POA: Diagnosis not present

## 2021-09-09 DIAGNOSIS — F17210 Nicotine dependence, cigarettes, uncomplicated: Secondary | ICD-10-CM | POA: Diagnosis present

## 2021-09-09 DIAGNOSIS — K219 Gastro-esophageal reflux disease without esophagitis: Secondary | ICD-10-CM | POA: Diagnosis present

## 2021-09-09 DIAGNOSIS — I10 Essential (primary) hypertension: Secondary | ICD-10-CM | POA: Diagnosis present

## 2021-09-09 DIAGNOSIS — Z79899 Other long term (current) drug therapy: Secondary | ICD-10-CM | POA: Diagnosis not present

## 2021-09-09 DIAGNOSIS — Z794 Long term (current) use of insulin: Secondary | ICD-10-CM | POA: Diagnosis not present

## 2021-09-09 DIAGNOSIS — E1065 Type 1 diabetes mellitus with hyperglycemia: Secondary | ICD-10-CM | POA: Diagnosis present

## 2021-09-09 DIAGNOSIS — E876 Hypokalemia: Secondary | ICD-10-CM | POA: Diagnosis not present

## 2021-09-09 DIAGNOSIS — F32A Depression, unspecified: Secondary | ICD-10-CM | POA: Diagnosis present

## 2021-09-09 DIAGNOSIS — R1115 Cyclical vomiting syndrome unrelated to migraine: Secondary | ICD-10-CM | POA: Diagnosis present

## 2021-09-09 LAB — RAPID URINE DRUG SCREEN, HOSP PERFORMED
Amphetamines: NOT DETECTED
Barbiturates: NOT DETECTED
Benzodiazepines: NOT DETECTED
Cocaine: NOT DETECTED
Opiates: NOT DETECTED
Tetrahydrocannabinol: POSITIVE — AB

## 2021-09-09 LAB — BASIC METABOLIC PANEL
Anion gap: 11 (ref 5–15)
BUN: 17 mg/dL (ref 6–20)
CO2: 22 mmol/L (ref 22–32)
Calcium: 9.5 mg/dL (ref 8.9–10.3)
Chloride: 102 mmol/L (ref 98–111)
Creatinine, Ser: 1.04 mg/dL — ABNORMAL HIGH (ref 0.44–1.00)
GFR, Estimated: >60 mL/min (ref >60)
Glucose, Bld: 126 mg/dL — ABNORMAL HIGH (ref 70–99)
Potassium: 3.3 mmol/L — ABNORMAL LOW (ref 3.5–5.1)
Sodium: 135 mmol/L (ref 135–145)

## 2021-09-09 LAB — GLUCOSE, CAPILLARY
Glucose-Capillary: 116 mg/dL — ABNORMAL HIGH (ref 70–99)
Glucose-Capillary: 138 mg/dL — ABNORMAL HIGH (ref 70–99)
Glucose-Capillary: 142 mg/dL — ABNORMAL HIGH (ref 70–99)
Glucose-Capillary: 144 mg/dL — ABNORMAL HIGH (ref 70–99)
Glucose-Capillary: 153 mg/dL — ABNORMAL HIGH (ref 70–99)
Glucose-Capillary: 318 mg/dL — ABNORMAL HIGH (ref 70–99)

## 2021-09-09 LAB — CBC
HCT: 30.6 % — ABNORMAL LOW (ref 36.0–46.0)
Hemoglobin: 10.4 g/dL — ABNORMAL LOW (ref 12.0–15.0)
MCH: 30.8 pg (ref 26.0–34.0)
MCHC: 34 g/dL (ref 30.0–36.0)
MCV: 90.5 fL (ref 80.0–100.0)
Platelets: 275 10*3/uL (ref 150–400)
RBC: 3.38 MIL/uL — ABNORMAL LOW (ref 3.87–5.11)
RDW: 12.7 % (ref 11.5–15.5)
WBC: 12.5 10*3/uL — ABNORMAL HIGH (ref 4.0–10.5)
nRBC: 0 % (ref 0.0–0.2)

## 2021-09-09 MED ORDER — METOCLOPRAMIDE HCL 10 MG PO TABS
10.0000 mg | ORAL_TABLET | Freq: Three times a day (TID) | ORAL | Status: DC
  Administered 2021-09-09 – 2021-09-10 (×3): 10 mg via ORAL
  Filled 2021-09-09 (×3): qty 1

## 2021-09-09 MED ORDER — POTASSIUM CHLORIDE CRYS ER 20 MEQ PO TBCR
40.0000 meq | EXTENDED_RELEASE_TABLET | Freq: Once | ORAL | Status: AC
Start: 2021-09-09 — End: 2021-09-09
  Administered 2021-09-09: 40 meq via ORAL
  Filled 2021-09-09: qty 2

## 2021-09-09 NOTE — Progress Notes (Signed)
[facility] PROGRESS NOTE   [name] MCN:[id number] DOB: [birth year] DOA: 09/08/2021  PCP: Laura Ng, NP  Brief History/Interval Summary: 31 year old female with past medical history of diabetes mellitus type 1, asthma, essential hypertension, cyclical vomiting syndrome, cannabis abuse presented with nausea vomiting.  Despite treatment in the ED she did not improve and so was hospitalized.  Consultants: None  Procedures: None    Subjective/Interval History: Patient mentions that she feels better this morning.  Denies any abdominal pain.  No nausea.  Last vomiting was yesterday night.   Assessment/Plan:  Cannabinoid hyperemesis syndrome Cyclical vomiting syndrome with intractable nausea and vomiting Probably due to cannabis.  She has had a negative gastric emptying study in February.  Patient was started on Reglan with improvement in symptoms.  She was counseled to stop smoking marijuana. Symptoms have improved.  We will challenge her with diet today. Urine drug screen results reviewed. If she tolerates her diet today we will change her Reglan to oral and plan for discharge tomorrow.  Diabetes mellitus type 1, uncontrolled with hyperglycemia No diabetic ketoacidosis noted.  Started back on her Lantus at a lower dose.  CBGs are stable this morning.  Continue SSI. HbA1c 7.9 in February.  Essential hypertension Continue amlodipine and lisinopril.  History of depression Continue with Paxil.  Hypokalemia Will be repleted.  Normocytic anemia Drop in hemoglobin is dilutional.  No overt bleeding noted.  Continue to monitor.  Anemia panel.  DVT Prophylaxis: Lovenox Code Status: Full code Family Communication: Discussed with patient Disposition Plan: Hopefully return home tomorrow  Status is: Observation The patient will require care spanning > 2 midnights and should be moved to inpatient because: Continues to require IV  metoclopramide      Medications: Scheduled:  amLODipine  5 mg Oral QPM   enoxaparin (LOVENOX) injection  40 mg Subcutaneous QHS   insulin aspart  0-9 Units Subcutaneous Q4H   insulin glargine-yfgn  10 Units Subcutaneous QHS   lidocaine  15 mL Oral Once   lisinopril  40 mg Oral q morning   metoCLOPramide (REGLAN) injection  10 mg Intravenous Q8H   pantoprazole  40 mg Oral Daily   PARoxetine  20 mg Oral QPM   Continuous:  lactated ringers 100 mL/hr at 09/09/21 6294   TML:[id number] **OR** acetaminophen, fluticasone, ondansetron **OR** ondansetron (ZOFRAN) IV  Antibiotics: Anti-infectives (From admission, onward)    None       Objective:  Vital Signs  Vitals:   09/09/21 0024 09/09/21 0029 09/09/21 0417 09/09/21 0832  BP: 129/77  131/88 (!) 153/96  Pulse: (!) 109  (!) 110 (!) 101  Resp:   14 16  Temp: 98.9 F (37.2 C)  98.6 F (37 C) 98.8 F (37.1 C)  TempSrc: Oral  Oral Oral  SpO2: 99%  100% 99%  Weight:  55.1 kg    Height:  5\' 4"  (1.626 m)      Intake/Output Summary (Last 24 hours) at 09/09/2021 0943 Last data filed at 09/09/2021 0300 Gross per 24 hour  Intake 1097.39 ml  Output --  Net 1097.39 ml   Filed Weights   09/08/21 1645 09/09/21 0029  Weight: 54.4 kg 55.1 kg    General appearance: Awake alert.  In no distress Resp: Clear to auscultation bilaterally.  Normal effort Cardio: S1-S2 is normal regular.  No S3-S4.  No rubs murmurs or bruit GI: Abdomen is soft.  Nontender nondistended.  Bowel sounds are present normal.  No masses organomegaly Extremities: No edema.  Full range of motion of lower extremities. Neurologic: Alert and oriented x3.  No focal neurological deficits.    Lab Results:  Data Reviewed: I have personally reviewed following labs and reports of the imaging studies  CBC: Recent Labs  Lab 09/04/21 0549 09/08/21 1729 09/09/21 0542  WBC 10.7* 11.5* 12.5*  HGB 13.4 13.2 10.4*  HCT 39.1 37.1 30.6*  MCV 91.4 89.0 90.5  PLT  392 352 275    Basic Metabolic Panel: Recent Labs  Lab 09/04/21 0549 09/08/21 1729 09/09/21 0542  NA 141 140 135  K 5.0 4.1 3.3*  CL 105 107 102  CO2 20* 20* 22  GLUCOSE 338* 283* 126*  BUN 27* 21* 17  CREATININE 1.39* 0.97 1.04*  CALCIUM 10.8* 10.5* 9.5    GFR: Estimated Creatinine Clearance: 68.3 mL/min (A) (by C-G formula based on SCr of 1.04 mg/dL (H)).  Liver Function Tests: Recent Labs  Lab 09/04/21 0549 09/08/21 1729  AST 23 14*  ALT 17 14  ALKPHOS 81 71  BILITOT 1.3* 0.9  PROT 9.1* 8.4*  ALBUMIN 4.7 4.4    Recent Labs  Lab 09/04/21 0549 09/08/21 1729  LIPASE 20 22    CBG: Recent Labs  Lab 09/08/21 2049 09/08/21 2346 09/09/21 0040 09/09/21 0412 09/09/21 0730  GLUCAP 218* 144* 153* 138* 116*     Radiology Studies: No results found.     LOS: 1 day   [name] [employer] on [email or web address]  09/09/2021, 9:43 AM

## 2021-09-10 ENCOUNTER — Inpatient Hospital Stay (HOSPITAL_COMMUNITY): Payer: BC Managed Care – PPO

## 2021-09-10 DIAGNOSIS — I10 Essential (primary) hypertension: Secondary | ICD-10-CM | POA: Diagnosis not present

## 2021-09-10 DIAGNOSIS — R112 Nausea with vomiting, unspecified: Secondary | ICD-10-CM | POA: Diagnosis not present

## 2021-09-10 DIAGNOSIS — F1290 Cannabis use, unspecified, uncomplicated: Secondary | ICD-10-CM | POA: Diagnosis not present

## 2021-09-10 DIAGNOSIS — E1065 Type 1 diabetes mellitus with hyperglycemia: Secondary | ICD-10-CM | POA: Diagnosis not present

## 2021-09-10 LAB — BASIC METABOLIC PANEL
Anion gap: 8 (ref 5–15)
BUN: 11 mg/dL (ref 6–20)
CO2: 25 mmol/L (ref 22–32)
Calcium: 9.3 mg/dL (ref 8.9–10.3)
Chloride: 99 mmol/L (ref 98–111)
Creatinine, Ser: 0.86 mg/dL (ref 0.44–1.00)
GFR, Estimated: >60 mL/min (ref >60)
Glucose, Bld: 262 mg/dL — ABNORMAL HIGH (ref 70–99)
Potassium: 4.1 mmol/L (ref 3.5–5.1)
Sodium: 132 mmol/L — ABNORMAL LOW (ref 135–145)

## 2021-09-10 LAB — GLUCOSE, CAPILLARY
Glucose-Capillary: 136 mg/dL — ABNORMAL HIGH (ref 70–99)
Glucose-Capillary: 136 mg/dL — ABNORMAL HIGH (ref 70–99)
Glucose-Capillary: 198 mg/dL — ABNORMAL HIGH (ref 70–99)
Glucose-Capillary: 253 mg/dL — ABNORMAL HIGH (ref 70–99)
Glucose-Capillary: 26 mg/dL — CL (ref 70–99)
Glucose-Capillary: 267 mg/dL — ABNORMAL HIGH (ref 70–99)
Glucose-Capillary: 311 mg/dL — ABNORMAL HIGH (ref 70–99)
Glucose-Capillary: 342 mg/dL — ABNORMAL HIGH (ref 70–99)
Glucose-Capillary: 50 mg/dL — ABNORMAL LOW (ref 70–99)
Glucose-Capillary: 58 mg/dL — ABNORMAL LOW (ref 70–99)

## 2021-09-10 LAB — CBC
HCT: 30.1 % — ABNORMAL LOW (ref 36.0–46.0)
Hemoglobin: 10.2 g/dL — ABNORMAL LOW (ref 12.0–15.0)
MCH: 30.7 pg (ref 26.0–34.0)
MCHC: 33.9 g/dL (ref 30.0–36.0)
MCV: 90.7 fL (ref 80.0–100.0)
Platelets: 249 10*3/uL (ref 150–400)
RBC: 3.32 MIL/uL — ABNORMAL LOW (ref 3.87–5.11)
RDW: 12.5 % (ref 11.5–15.5)
WBC: 6.7 10*3/uL (ref 4.0–10.5)
nRBC: 0 % (ref 0.0–0.2)

## 2021-09-10 LAB — FERRITIN: Ferritin: 25 ng/mL (ref 11–307)

## 2021-09-10 LAB — IRON AND TIBC
Iron: 64 ug/dL (ref 28–170)
Saturation Ratios: 22 % (ref 10.4–31.8)
TIBC: 294 ug/dL (ref 250–450)
UIBC: 230 ug/dL

## 2021-09-10 LAB — FOLATE: Folate: 13.9 ng/mL (ref >5.9)

## 2021-09-10 LAB — RETICULOCYTES
Immature Retic Fract: 7 % (ref 2.3–15.9)
RBC.: 3.33 MIL/uL — ABNORMAL LOW (ref 3.87–5.11)
Retic Count, Absolute: 39 10*3/uL (ref 19.0–186.0)
Retic Ct Pct: 1.2 % (ref 0.4–3.1)

## 2021-09-10 LAB — MAGNESIUM: Magnesium: 1.8 mg/dL (ref 1.7–2.4)

## 2021-09-10 LAB — VITAMIN B12: Vitamin B-12: 536 pg/mL (ref 180–914)

## 2021-09-10 MED ORDER — MORPHINE SULFATE (PF) 2 MG/ML IV SOLN
2.0000 mg | Freq: Once | INTRAVENOUS | Status: AC
  Administered 2021-09-10: 2 mg via INTRAVENOUS
  Filled 2021-09-10: qty 1

## 2021-09-10 MED ORDER — PANTOPRAZOLE SODIUM 40 MG IV SOLR
40.0000 mg | Freq: Two times a day (BID) | INTRAVENOUS | Status: DC
  Administered 2021-09-10 – 2021-09-12 (×4): 40 mg via INTRAVENOUS
  Filled 2021-09-10 (×4): qty 10

## 2021-09-10 MED ORDER — INSULIN ASPART 100 UNIT/ML IJ SOLN
0.0000 [IU] | Freq: Three times a day (TID) | INTRAMUSCULAR | Status: DC
  Administered 2021-09-10: 8 [IU] via SUBCUTANEOUS
  Administered 2021-09-11: 5 [IU] via SUBCUTANEOUS
  Administered 2021-09-11: 3 [IU] via SUBCUTANEOUS
  Administered 2021-09-11 – 2021-09-12 (×2): 5 [IU] via SUBCUTANEOUS

## 2021-09-10 MED ORDER — DEXTROSE 50 % IV SOLN: 25.0000 g | INTRAVENOUS | Status: AC

## 2021-09-10 MED ORDER — DEXTROSE 50 % IV SOLN
INTRAVENOUS | Status: AC
  Administered 2021-09-10: 50 mL
  Filled 2021-09-10: qty 50

## 2021-09-10 MED ORDER — METOCLOPRAMIDE HCL 5 MG/ML IJ SOLN
10.0000 mg | Freq: Four times a day (QID) | INTRAMUSCULAR | Status: DC
  Administered 2021-09-10 – 2021-09-12 (×8): 10 mg via INTRAVENOUS
  Filled 2021-09-10 (×8): qty 2

## 2021-09-10 MED ORDER — METOCLOPRAMIDE HCL 5 MG PO TABS: 5.0000 mg | ORAL_TABLET | Freq: Three times a day (TID) | ORAL | 0 refills | Status: DC

## 2021-09-10 MED ORDER — MORPHINE SULFATE (PF) 2 MG/ML IV SOLN
2.0000 mg | INTRAVENOUS | Status: DC | PRN
  Administered 2021-09-10: 2 mg via INTRAVENOUS
  Filled 2021-09-10: qty 1

## 2021-09-10 MED ORDER — ALUM & MAG HYDROXIDE-SIMETH 200-200-20 MG/5ML PO SUSP
15.0000 mL | ORAL | Status: DC | PRN
  Administered 2021-09-10 – 2021-09-11 (×2): 15 mL via ORAL
  Filled 2021-09-10 (×2): qty 30

## 2021-09-10 NOTE — Progress Notes (Signed)
Hypoglycemic Event  CBG: 50 Time: 0429  Treatment: 8 oz juice/soda  Symptoms: None  Follow-up CBG: Time:0450,  [id number]               CBG Result:58, 136  Possible Reasons for Event: Unknown      [name]

## 2021-09-10 NOTE — Progress Notes (Signed)
[facility] PROGRESS NOTE   [name] [id number] DOB: [birth year] DOA: 09/08/2021  PCP: Flossie Buffy, NP  Brief History/Interval Summary: 31 year old female with past medical history of diabetes mellitus type 1, asthma, essential hypertension, cyclical vomiting syndrome, cannabis abuse presented with nausea vomiting.  Despite treatment in the ED she did not improve and so was hospitalized.  Consultants: None  Procedures: None    Subjective/Interval History: Patient was feeling better earlier this morning but then informed by nursing staff that she started vomiting again.     Assessment/Plan:  Cannabinoid hyperemesis syndrome Cyclical vomiting syndrome with intractable nausea and vomiting Probably due to cannabis.  She has had a negative gastric emptying study in February.  Patient was started on Reglan with improvement in symptoms.  She was counseled to stop smoking marijuana. Symptoms had improved.  She was challenged with diet.  She did okay yesterday but has been vomiting this morning.  We will switch her back to IV Reglan.  Keep her on soft diet for now.  May need to downgrade diet if she continues to have emesis.  We will hold discharge for now.  Will order abdominal films.  She had a CT scan recently in April which did not show any acute findings.  Lipase level was normal.  LFTs have been unremarkable.  Diabetes mellitus type 1, uncontrolled with hyperglycemia No diabetic ketoacidosis noted.  Started back on her Lantus at a lower dose.   Noted to have hypoglycemic episodes overnight.  Continue to monitor.  Continue current dose of glargine.   HbA1c 7.9 in February.  Essential hypertension Continue amlodipine and lisinopril.  History of depression Continue with Paxil.  Hypokalemia Potassium level has improved.  Magnesium 1.8.  Normocytic anemia Drop in hemoglobin is dilutional.  No overt bleeding noted.  Continue to monitor.  Anemia panel  reviewed.  No clinical deficiencies identified.  Iron level will need to be rechecked in a few months.  DVT Prophylaxis: Lovenox Code Status: Full code Family Communication: Discussed with patient Disposition Plan: Patient has had a setback today.  Anticipate discharge in 1 to 2 days.  Status is: Inpatient Remains inpatient appropriate because: Persistent nausea and vomiting     Medications: Scheduled:  amLODipine  5 mg Oral QPM   enoxaparin (LOVENOX) injection  40 mg Subcutaneous QHS   insulin aspart  0-9 Units Subcutaneous Q4H   insulin glargine-yfgn  10 Units Subcutaneous QHS   lidocaine  15 mL Oral Once   lisinopril  40 mg Oral q morning   metoCLOPramide (REGLAN) injection  10 mg Intravenous Q6H   pantoprazole  40 mg Oral Daily   PARoxetine  20 mg Oral QPM   Continuous:  lactated ringers 100 mL/hr at 09/09/21 2015   [id number] **OR** acetaminophen, alum & mag hydroxide-simeth, fluticasone, ondansetron **OR** ondansetron (ZOFRAN) IV  Antibiotics: Anti-infectives (From admission, onward)    None       Objective:  Vital Signs  Vitals:   09/09/21 0832 09/09/21 1310 09/09/21 1949 09/10/21 0428  BP: (!) 153/96 (!) 143/90 127/77 (!) 145/95  Pulse: (!) 101 98 94 89  Resp: 16 18 18 18   Temp: 98.8 F (37.1 C) 98.7 F (37.1 C) 98.7 F (37.1 C) 98.3 F (36.8 C)  TempSrc: Oral Oral Oral Oral  SpO2: 99% 100% 100% 100%  Weight:      Height:        Intake/Output Summary (Last 24 hours) at 09/10/2021 [id number] Last data filed at 09/10/2021 0200 Gross  per 24 hour  Intake 1532.04 ml  Output --  Net 1532.04 ml    Filed Weights   09/08/21 1645 09/09/21 0029  Weight: 54.4 kg 55.1 kg    General appearance: Awake alert.  In no distress Resp: Clear to auscultation bilaterally.  Normal effort Cardio: S1-S2 is normal regular.  No S3-S4.  No rubs murmurs or bruit GI: Abdomen is soft.  Nontender nondistended.  Bowel sounds are present normal.  No masses  organomegaly Extremities: No edema.  Full range of motion of lower extremities. Neurologic: Alert and oriented x3.  No focal neurological deficits.     Lab Results:  Data Reviewed: I have personally reviewed following labs and reports of the imaging studies  CBC: Recent Labs  Lab 09/04/21 0549 09/08/21 1729 09/09/21 0542 09/10/21 0559  WBC 10.7* 11.5* 12.5* 6.7  HGB 13.4 13.2 10.4* 10.2*  HCT 39.1 37.1 30.6* 30.1*  MCV 91.4 89.0 90.5 90.7  PLT 392 352 275 249     Basic Metabolic Panel: Recent Labs  Lab 09/04/21 0549 09/08/21 1729 09/09/21 0542 09/10/21 0559  NA 141 140 135 132*  K 5.0 4.1 3.3* 4.1  CL 105 107 102 99  CO2 20* 20* 22 25  GLUCOSE 338* 283* 126* 262*  BUN 27* 21* 17 11  CREATININE 1.39* 0.97 1.04* 0.86  CALCIUM 10.8* 10.5* 9.5 9.3  MG  --   --   --  1.8     GFR: Estimated Creatinine Clearance: 82.6 mL/min (by C-G formula based on SCr of 0.86 mg/dL).  Liver Function Tests: Recent Labs  Lab 09/04/21 0549 09/08/21 1729  AST 23 14*  ALT 17 14  ALKPHOS 81 71  BILITOT 1.3* 0.9  PROT 9.1* 8.4*  ALBUMIN 4.7 4.4     Recent Labs  Lab 09/04/21 0549 09/08/21 1729  LIPASE 20 22     CBG: Recent Labs  Lab 09/10/21 0206 09/10/21 0429 09/10/21 0450 09/10/21 0506 09/10/21 0737  GLUCAP 136* 50* 58* 136* 311*      Radiology Studies: No results found.     LOS: 2 days   [facility] Pager on [email or web address]  09/10/2021, 9:52 AM

## 2021-09-10 NOTE — Progress Notes (Signed)
Hypoglycemic Event  CBG: 26 Time: 0152  Treatment: D50 50 mL (25 gm)  Symptoms: Shaky and Nervous/irritable  Follow-up CBG: Time:0206 CBG Result:136  Possible Reasons for Event: Medication regimen:    Comments/MD notified:Ouma    Orvil Feil

## 2021-09-10 NOTE — Progress Notes (Signed)
   09/10/21 1412  Assess: MEWS Score  Temp 98.6 F (37 C)  BP (!) 176/96  Pulse Rate (!) 112  Resp 15  SpO2 100 %  Assess: MEWS Score  MEWS Temp 0  MEWS Systolic 0  MEWS Pulse 2  MEWS RR 0  MEWS LOC 0  MEWS Score 2  MEWS Score Color Yellow  Assess: if the MEWS score is Yellow or Red  Were vital signs taken at a resting state? Yes  Focused Assessment Change from prior assessment (see assessment flowsheet)  Does the patient meet 2 or more of the SIRS criteria? No  MEWS guidelines implemented *See Row Information* Yes  Treat  MEWS Interventions Administered scheduled meds/treatments  Pain Scale 0-10  Pain Score 0  Take Vital Signs  Increase Vital Sign Frequency  Yellow: Q 2hr X 2 then Q 4hr X 2, if remains yellow, continue Q 4hrs  Escalate  MEWS: Escalate Yellow: discuss with charge nurse/RN and consider discussing with provider and RRT  Notify: Charge Nurse/RN  Name of Charge Nurse/RN Notified Holli Socin,RN  Date Charge Nurse/RN Notified 09/10/21  Time Charge Nurse/RN Notified 1420  Notify: Provider  Provider Name/Title Dr Osvaldo Shipper  Date Provider Notified 09/10/21  Time Provider Notified 1430  Method of Notification Page  Notification Reason Change in status  Provider response No new orders  Date of Provider Response 09/10/21  Time of Provider Response 1500  Document  Progress note created (see row info) Yes  Assess: SIRS CRITERIA  SIRS Temperature  0  SIRS Pulse 1  SIRS Respirations  0  SIRS WBC 0  SIRS Score Sum  1

## 2021-09-11 DIAGNOSIS — R112 Nausea with vomiting, unspecified: Secondary | ICD-10-CM | POA: Diagnosis not present

## 2021-09-11 DIAGNOSIS — E1065 Type 1 diabetes mellitus with hyperglycemia: Secondary | ICD-10-CM | POA: Diagnosis not present

## 2021-09-11 DIAGNOSIS — I10 Essential (primary) hypertension: Secondary | ICD-10-CM | POA: Diagnosis not present

## 2021-09-11 DIAGNOSIS — F1290 Cannabis use, unspecified, uncomplicated: Secondary | ICD-10-CM | POA: Diagnosis not present

## 2021-09-11 LAB — GLUCOSE, CAPILLARY
Glucose-Capillary: 153 mg/dL — ABNORMAL HIGH (ref 70–99)
Glucose-Capillary: 211 mg/dL — ABNORMAL HIGH (ref 70–99)
Glucose-Capillary: 202 mg/dL — ABNORMAL HIGH (ref 70–99)
Glucose-Capillary: 189 mg/dL — ABNORMAL HIGH (ref 70–99)

## 2021-09-11 LAB — BASIC METABOLIC PANEL
Anion gap: 13 (ref 5–15)
BUN: 13 mg/dL (ref 6–20)
CO2: 24 mmol/L (ref 22–32)
Calcium: 9.7 mg/dL (ref 8.9–10.3)
Chloride: 104 mmol/L (ref 98–111)
Creatinine, Ser: 1.3 mg/dL — ABNORMAL HIGH (ref 0.44–1.00)
GFR, Estimated: 57 mL/min — ABNORMAL LOW (ref >60)
Glucose, Bld: 175 mg/dL — ABNORMAL HIGH (ref 70–99)
Potassium: 3.1 mmol/L — ABNORMAL LOW (ref 3.5–5.1)
Sodium: 141 mmol/L (ref 135–145)

## 2021-09-11 MED ORDER — POTASSIUM CHLORIDE IN NACL 20-0.45 MEQ/L-% IV SOLN
INTRAVENOUS | Status: DC
  Filled 2021-09-11 (×4): qty 1000

## 2021-09-11 MED ORDER — SUCRALFATE 1 GM/10ML PO SUSP
1.0000 g | Freq: Three times a day (TID) | ORAL | Status: DC
  Administered 2021-09-11 – 2021-09-12 (×4): 1 g via ORAL
  Filled 2021-09-11 (×4): qty 10

## 2021-09-11 MED ORDER — POTASSIUM CHLORIDE 20 MEQ PO PACK
40.0000 meq | PACK | Freq: Once | ORAL | Status: AC
  Administered 2021-09-11: 40 meq via ORAL
  Filled 2021-09-11: qty 2

## 2021-09-11 MED ORDER — POTASSIUM CHLORIDE CRYS ER 20 MEQ PO TBCR: 40.0000 meq | EXTENDED_RELEASE_TABLET | Freq: Once | ORAL | Status: DC

## 2021-09-11 MED ORDER — SODIUM CHLORIDE 0.45 % IV BOLUS
500.0000 mL | Freq: Once | INTRAVENOUS | Status: AC
  Administered 2021-09-11: 500 mL via INTRAVENOUS

## 2021-09-11 NOTE — Progress Notes (Signed)
  Transition of Care (TOC) Screening Note   Patient Details  Name: [name] Date of Birth: September 26, 1990   Transition of Care [facility]) CM/SW Contact:    Amada Jupiter, LCSW Phone Number: 09/11/2021, 9:13 AM    Transition of Care Department [facility]) has reviewed patient and no TOC needs have been identified at this time. We will continue to monitor patient advancement through interdisciplinary progression rounds. If new patient transition needs arise, please place a TOC consult.

## 2021-09-11 NOTE — Plan of Care (Signed)
  Problem: Nutrition: Goal: Adequate nutrition will be maintained Outcome: Not Progressing   

## 2021-09-11 NOTE — Progress Notes (Signed)
[facility] PROGRESS NOTE   [name] [id number] DOB: 1991-01-26 DOA: 09/08/2021  PCP: Anne Ng, NP  Brief History/Interval Summary: 31 year old female with past medical history of diabetes mellitus type 1, asthma, essential hypertension, cyclical vomiting syndrome, cannabis abuse presented with nausea vomiting.  Despite treatment in the ED she did not improve and so was hospitalized.  Consultants: None  Procedures: None    Subjective/Interval History: Complains of heartburn.  No further nausea vomiting since yesterday afternoon.  No chest pain.  No shortness of breath.     Assessment/Plan:  Cannabinoid hyperemesis syndrome Cyclical vomiting syndrome with intractable nausea and vomiting Probably due to cannabis.  She has had a negative gastric emptying study in February.  Patient was started on Reglan with improvement in symptoms.  She was counseled to stop smoking marijuana. Continues to have acid reflux.  Maalox has been ordered.  Continue PPI twice a day Nausea vomiting appears to have improved again.  We will challenge her with a soft diet today.  Continue IV Reglan for now.  Transition to oral tomorrow if she is able to tolerate a soft diet today. Abdominal films do not show any acute findings. She had a CT scan recently in April which did not show any acute findings.  Lipase level was normal.  LFTs have been unremarkable.  Diabetes mellitus type 1, uncontrolled with hyperglycemia No diabetic ketoacidosis noted.  Started back on her Lantus at a lower dose.   No further episodes of hypoglycemia.  HbA1c 7.9 in February.  Essential hypertension Continue amlodipine and lisinopril.  Pressure is reasonably well controlled.  History of depression Continue with Paxil.  Hypokalemia Potassium level again low today.  Will replete.  Elevated creatinine/acute kidney injury Creatinine noted to be 1.3 today.  Presented with creatinine of 1.39.  Improved  to 0.86.  We will give her IV fluid bolus and recheck labs tomorrow.  Continue maintenance IV fluids.  Monitor urine output.  Normocytic anemia Drop in hemoglobin is dilutional.  No overt bleeding noted.  Continue to monitor.  Anemia panel reviewed.  No clinical deficiencies identified.  Iron level will need to be rechecked in a few months.  DVT Prophylaxis: Lovenox Code Status: Full code Family Communication: Discussed with patient Disposition Plan: Ambulate.  Hopefully discharge in 24 to 48 hours.  Status is: Inpatient Remains inpatient appropriate because: Persistent nausea and vomiting     Medications: Scheduled:  amLODipine  5 mg Oral QPM   enoxaparin (LOVENOX) injection  40 mg Subcutaneous QHS   insulin aspart  0-15 Units Subcutaneous TID WC   insulin glargine-yfgn  10 Units Subcutaneous QHS   lidocaine  15 mL Oral Once   lisinopril  40 mg Oral q morning   metoCLOPramide (REGLAN) injection  10 mg Intravenous Q6H   pantoprazole (PROTONIX) IV  40 mg Intravenous Q12H   PARoxetine  20 mg Oral QPM   Continuous:  lactated ringers 100 mL/hr at 09/10/21 1539   VEL:[id number] **OR** acetaminophen, alum & mag hydroxide-simeth, fluticasone, morphine injection, ondansetron **OR** ondansetron (ZOFRAN) IV  Antibiotics: Anti-infectives (From admission, onward)    None       Objective:  Vital Signs  Vitals:   09/10/21 1412 09/10/21 2014 09/11/21 0037 09/11/21 0503  BP: (!) 176/96 116/89 129/83 (!) 135/92  Pulse: (!) 112 (!) 102 (!) 104 99  Resp: 15 18 18 18   Temp: 98.6 F (37 C) 98.8 F (37.1 C) 98.3 F (36.8 C) 98.1 F (36.7 C)  TempSrc:  Oral Oral Oral  SpO2: 100% 100% 100% 100%  Weight:      Height:       No intake or output data in the 24 hours ending 09/11/21 1002  Filed Weights   09/08/21 1645 09/09/21 0029  Weight: 54.4 kg 55.1 kg    General appearance: Awake alert.  In no distress Resp: Clear to auscultation bilaterally.  Normal effort Cardio:  S1-S2 is normal regular.  No S3-S4.  No rubs murmurs or bruit GI: Abdomen is soft.  Mildly tender in the epigastric area without any rebound rigidity or guarding. Extremities: No edema.  Full range of motion of lower extremities. Neurologic: Alert and oriented x3.  No focal neurological deficits.      Lab Results:  Data Reviewed: I have personally reviewed following labs and reports of the imaging studies  CBC: Recent Labs  Lab 09/08/21 1729 09/09/21 0542 09/10/21 0559  WBC 11.5* 12.5* 6.7  HGB 13.2 10.4* 10.2*  HCT 37.1 30.6* 30.1*  MCV 89.0 90.5 90.7  PLT 352 275 249     Basic Metabolic Panel: Recent Labs  Lab 09/08/21 1729 09/09/21 0542 09/10/21 0559 09/11/21 0806  NA 140 135 132* 141  K 4.1 3.3* 4.1 3.1*  CL 107 102 99 104  CO2 20* 22 25 24   GLUCOSE 283* 126* 262* 175*  BUN 21* 17 11 13   CREATININE 0.97 1.04* 0.86 1.30*  CALCIUM 10.5* 9.5 9.3 9.7  MG  --   --  1.8  --      GFR: Estimated Creatinine Clearance: 54.6 mL/min (A) (by C-G formula based on SCr of 1.3 mg/dL (H)).  Liver Function Tests: Recent Labs  Lab 09/08/21 1729  AST 14*  ALT 14  ALKPHOS 71  BILITOT 0.9  PROT 8.4*  ALBUMIN 4.4     Recent Labs  Lab 09/08/21 1729  LIPASE 22     CBG: Recent Labs  Lab 09/10/21 0737 09/10/21 1218 09/10/21 1628 09/10/21 2016 09/11/21 0731  GLUCAP 311* 342* 267* 198* 153*      Radiology Studies: DG Abd Portable 2V  Result Date: 09/10/2021 CLINICAL DATA:  Nausea and vomiting.  History of diabetes. EXAM: PORTABLE ABDOMEN - 2 VIEW COMPARISON:  08/04/2021 FINDINGS: The bowel gas pattern is normal. There is no evidence of free air. No radio-opaque calculi or other significant radiographic abnormality is seen. IMPRESSION: Negative. Electronically Signed   By: 09/12/2021 M.D.   On: 09/10/2021 12:14       LOS: 3 days   Hesston Hitchens Signa Kell  [facility] Pager on [email or web address]  09/11/2021, 10:02 AM

## 2021-09-12 DIAGNOSIS — R112 Nausea with vomiting, unspecified: Secondary | ICD-10-CM | POA: Diagnosis not present

## 2021-09-12 DIAGNOSIS — F1290 Cannabis use, unspecified, uncomplicated: Secondary | ICD-10-CM | POA: Diagnosis not present

## 2021-09-12 LAB — BASIC METABOLIC PANEL
Anion gap: 9 (ref 5–15)
BUN: 10 mg/dL (ref 6–20)
CO2: 24 mmol/L (ref 22–32)
Calcium: 9.3 mg/dL (ref 8.9–10.3)
Chloride: 102 mmol/L (ref 98–111)
Creatinine, Ser: 1.03 mg/dL — ABNORMAL HIGH (ref 0.44–1.00)
GFR, Estimated: >60 mL/min (ref >60)
Glucose, Bld: 257 mg/dL — ABNORMAL HIGH (ref 70–99)
Potassium: 3.7 mmol/L (ref 3.5–5.1)
Sodium: 135 mmol/L (ref 135–145)

## 2021-09-12 LAB — MAGNESIUM: Magnesium: 1.7 mg/dL (ref 1.7–2.4)

## 2021-09-12 LAB — GLUCOSE, CAPILLARY: Glucose-Capillary: 229 mg/dL — ABNORMAL HIGH (ref 70–99)

## 2021-09-12 MED ORDER — SUCRALFATE 1 G PO TABS: 1.0000 g | ORAL_TABLET | Freq: Three times a day (TID) | ORAL | 0 refills | Status: DC

## 2021-09-12 NOTE — Progress Notes (Signed)
Inpatient Diabetes Program Recommendations  AACE/ADA: New Consensus Statement on Inpatient Glycemic Control (2015)  Target Ranges:  Prepandial:   less than 140 mg/dL      Peak postprandial:   less than 180 mg/dL (1-2 hours)      Critically ill patients:  140 - 180 mg/dL   Lab Results  Component Value Date   GLUCAP 229 (H) 09/12/2021   HGBA1C 7.9 (H) 08/05/2021    Review of Glycemic Control  Diabetes history: DM1 Outpatient Diabetes medications: Lantus 18 units QHS, Novolog 1:8 carb ratio and ICF of 1:40  Current orders for Inpatient glycemic control: Semglee 10 units QD, Novolog 0-15 units tid   Spoke with patient at bedside.  She is current with endocrinology at [facility].  Last visit was 08/08/21.  She started the Dexcom back in February.  Her A1C has decreased from 10% to 7.9%.  Congratulated her on her accomplishment.  She states she believes she has gastroparesis.  She is scheduled at the end of June with a gastroenterologist.    She confirms above home medications.  She is currently wearing her Dexcom.    She has an appointment with endocrinology on June 5th to start on the Omnipod insulin pump.  She has asked for outpatient diabetes education.  Will place order for outpatient referral.    Will continue to follow while inpatient.  Thank you, Dulce Sellar, MSN, CDCES Diabetes Coordinator Inpatient Diabetes Program [phone] (team pager from 8a-5p)

## 2021-09-12 NOTE — Discharge Summary (Signed)
[facility]  Physician Discharge Summary   Patient ID: [name] MRN: [id number] DOB/AGE: [birth year] 31 y.o.  Admit date: 09/08/2021 Discharge date:   09/12/2021   PCP: Flossie Buffy, NP  DISCHARGE DIAGNOSES:  Principal Problem:   Cannabinoid hyperemesis syndrome Active Problems:   Uncontrolled type 1 diabetes mellitus with hyperglycemia (HCC)   Essential hypertension, benign   GERD (gastroesophageal reflux disease)   RECOMMENDATIONS FOR OUTPATIENT FOLLOW UP: Outpatient follow-up with PCP/endocrinologist for further management of diabetes   Home Health: None Equipment/Devices: None  CODE STATUS: Full code  DISCHARGE CONDITION: fair  Diet recommendation: Modified carbohydrate  INITIAL HISTORY: 31 year old female with past medical history of diabetes mellitus type 1, asthma, essential hypertension, cyclical vomiting syndrome, cannabis abuse presented with nausea vomiting.  Despite treatment in the ED she did not improve and so was hospitalized.    HOSPITAL COURSE:   Cannabinoid hyperemesis syndrome Cyclical vomiting syndrome with intractable nausea and vomiting Probably due to cannabis.  She has had a negative gastric emptying study in February.  Patient was started on Reglan with improvement in symptoms.  She was counseled to stop smoking marijuana. Abdominal films do not show any acute findings. She had a CT scan recently in April which did not show any acute findings.  Lipase level was normal.  LFTs have been unremarkable. She was started on intravenous metoclopramide.  Continues to have acid reflux.  Maalox has been ordered.  Continue PPI twice a day.  Also given Carafate.  Symptoms have improved.  She has been able to tolerate solid food.  If she tolerates lunch today then she should be able to go home.  Diabetes mellitus type 1, uncontrolled with hyperglycemia No diabetic ketoacidosis noted.  Started back on her Lantus at a lower dose.   HbA1c  7.9 in February.  Essential hypertension Continue home medications  History of depression Continue with Paxil.  Hypokalemia Potassium level again low today.  Will replete.   Elevated creatinine/acute kidney injury Creatinine noted to be 1.3 yesterday.  She was given fluid bolus and continued on IV fluids.  Improvement in renal function noted today.  Normocytic anemia Drop in hemoglobin is dilutional.  No overt bleeding noted.  Continue to monitor.  Anemia panel reviewed.  No clinical deficiencies identified.  Iron level will need to be rechecked in a few months.    Patient stable.  Okay for discharge home after lunch if she does not have any further nausea or vomiting.   PERTINENT LABS:  The results of significant diagnostics from this hospitalization (including imaging, microbiology, ancillary and laboratory) are listed below for reference.     Labs:   Basic Metabolic Panel: Recent Labs  Lab 09/08/21 1729 09/09/21 0542 09/10/21 0559 09/11/21 0806 09/12/21 0558  NA 140 135 132* 141 135  K 4.1 3.3* 4.1 3.1* 3.7  CL 107 102 99 104 102  CO2 20* 22 25 24 24   GLUCOSE 283* 126* 262* 175* 257*  BUN 21* 17 11 13 10   CREATININE 0.97 1.04* 0.86 1.30* 1.03*  CALCIUM 10.5* 9.5 9.3 9.7 9.3  MG  --   --  1.8  --  1.7   Liver Function Tests: Recent Labs  Lab 09/08/21 1729  AST 14*  ALT 14  ALKPHOS 71  BILITOT 0.9  PROT 8.4*  ALBUMIN 4.4   Recent Labs  Lab 09/08/21 1729  LIPASE 22   CBC: Recent Labs  Lab 09/08/21 1729 09/09/21 0542 09/10/21 0559  WBC 11.5* 12.5* 6.7  HGB 13.2 10.4* 10.2*  HCT 37.1 30.6* 30.1*  MCV 89.0 90.5 90.7  PLT 352 275 249     CBG: Recent Labs  Lab 09/11/21 0731 09/11/21 1202 09/11/21 1640 09/11/21 2051 09/12/21 0724  GLUCAP 153* 211* 202* 189* 229*     IMAGING STUDIES DG Abd Portable 2V  Result Date: 09/10/2021 CLINICAL DATA:  Nausea and vomiting.  History of diabetes. EXAM: PORTABLE ABDOMEN - 2 VIEW COMPARISON:   08/04/2021 FINDINGS: The bowel gas pattern is normal. There is no evidence of free air. No radio-opaque calculi or other significant radiographic abnormality is seen. IMPRESSION: Negative. Electronically Signed   By: Kerby Moors M.D.   On: 09/10/2021 12:14    DISCHARGE EXAMINATION: Vitals:   09/11/21 1434 09/11/21 1718 09/11/21 2033 09/12/21 0515  BP: 115/83 121/81 131/84 (!) 152/104  Pulse: (!) 101  93 92  Resp: 15  16 16   Temp: 98.2 F (36.8 C)  98.6 F (37 C) 98.2 F (36.8 C)  TempSrc: Oral  Oral Oral  SpO2: 100%  100% 100%  Weight:      Height:       General appearance: Awake alert.  In no distress Resp: Clear to auscultation bilaterally.  Normal effort Cardio: S1-S2 is normal regular.  No S3-S4.  No rubs murmurs or bruit GI: Abdomen is soft.  Nontender nondistended.  Bowel sounds are present normal.  No masses organomegaly   DISPOSITION: Home  Discharge Instructions     Call MD for:  difficulty breathing, headache or visual disturbances   Complete by: As directed    Call MD for:  extreme fatigue   Complete by: As directed    Call MD for:  persistant dizziness or light-headedness   Complete by: As directed    Call MD for:  persistant nausea and vomiting   Complete by: As directed    Call MD for:  severe uncontrolled pain   Complete by: As directed    Call MD for:  temperature >100.4   Complete by: As directed    Diet Carb Modified   Complete by: As directed    Discharge instructions   Complete by: As directed    Please take your medications as prescribed.  Please be sure to follow-up with your primary care provider in 1 week.  Please stop using cannabis or cannabis products.  You were cared for by a hospitalist during your hospital stay. If you have any questions about your discharge medications or the care you received while you were in the hospital after you are discharged, you can call the unit and asked to speak with the hospitalist on call if the hospitalist  that took care of you is not available. Once you are discharged, your primary care physician will handle any further medical issues. Please note that NO REFILLS for any discharge medications will be authorized once you are discharged, as it is imperative that you return to your primary care physician (or establish a relationship with a primary care physician if you do not have one) for your aftercare needs so that they can reassess your need for medications and monitor your lab values. If you do not have a primary care physician, you can call [phone] for a physician referral.   Increase activity slowly   Complete by: As directed          Allergies as of 09/12/2021       Reactions   Aleve [naproxen Sodium] Swelling  Medication List     TAKE these medications    acetaminophen 500 MG tablet Commonly known as: TYLENOL Take 1,000 mg by mouth every 6 (six) hours as needed (cramps).   amLODipine 5 MG tablet Commonly known as: NORVASC Take 5 mg by mouth every evening.   etonogestrel 68 MG Impl implant Commonly known as: NEXPLANON 1 each by Subdermal route once. Implanted summer 2020   fluticasone 50 MCG/ACT nasal spray Commonly known as: FLONASE Place 2 sprays into both nostrils daily. What changed:  when to take this reasons to take this   FreeStyle Libre 14 Day Sensor Misc by Other route every fourteen (14) days. One libre sensor every 14 days   insulin glargine 100 UNIT/ML Solostar Pen Commonly known as: LANTUS Inject 18 Units into the skin at bedtime.   lisinopril 40 MG tablet Commonly known as: ZESTRIL Take 40 mg by mouth every morning.   metoCLOPramide 5 MG tablet Commonly known as: REGLAN Take 1 tablet (5 mg total) by mouth 4 (four) times daily -  before meals and at bedtime. What changed:  when to take this reasons to take this   NovoLOG FlexPen 100 UNIT/ML FlexPen Generic drug: insulin aspart Inject 5-40 Units into the skin 3 (three) times daily  with meals. Dose is based on CBG and carbs (1:8)   pantoprazole 40 MG tablet Commonly known as: PROTONIX Take 1 tablet (40 mg total) by mouth daily as needed (heart burn).   PARoxetine 20 MG tablet Commonly known as: PAXIL Take 20 mg by mouth every evening.   polyethylene glycol powder 17 GM/SCOOP powder Commonly known as: GLYCOLAX/MIRALAX Take 17 g by mouth daily.   ProAir RespiClick [id number] (90 Base) MCG/ACT Aepb Generic drug: Albuterol Sulfate Inhale 1-2 puffs into the lungs every 6 (six) hours as needed.   sucralfate 1 g tablet Commonly known as: Carafate Take 1 tablet (1 g total) by mouth 4 (four) times daily -  with meals and at bedtime for 5 days.   ULTRA-THIN II SHORT PEN NEEDLE 31G X 8 MM Misc Generic drug: Insulin Pen Needle Inject into the skin.          Follow-up Information     Nche, Charlene Brooke, NP. Schedule an appointment as soon as possible for a visit in 1 week(s).   Specialty: Internal Medicine Why: post hospitalization follow up Contact information: [place] [phone]                 TOTAL DISCHARGE TIME: 53 minutes  [place]  [facility] Pager on [email or web address]  09/12/2021, 8:23 AM

## 2021-09-12 NOTE — Progress Notes (Signed)
24 hour chart audit completed 

## 2021-09-12 NOTE — Progress Notes (Signed)
5/30 IM letter mailed to the patient's address on file. Spoke to pt's mother, Koleen Distance via telephone [phone]) and letter was explained and acknowledged.

## 2021-09-12 NOTE — Care Management Important Message (Signed)
Important Message  Patient Details  Name: [name] MRN: [id number] Date of Birth: 1990/07/09   Medicare Important Message Given:        Hannah Beat 09/12/2021, 12:22 PM

## 2021-09-12 NOTE — Progress Notes (Signed)
AVS given to patient. Reviewed discharge instructions and medications. All questions answered. IV removed and guaze dressing placed with no complications. Patient walked off unit to cab.

## 2021-09-13 ENCOUNTER — Telehealth: Payer: Self-pay

## 2021-09-13 NOTE — Telephone Encounter (Signed)
Transition Care Management Unsuccessful Follow-up Telephone Call  Date of discharge and from where:  [name] 09/12/2021  Attempts:  1st Attempt  Reason for unsuccessful TCM follow-up call:  No answer/busy

## 2021-09-14 NOTE — Telephone Encounter (Signed)
Transition Care Management Unsuccessful Follow-up Telephone Call  Date of discharge and from where:  09/12/2021 [facility]   Attempts:  2nd Attempt  Reason for unsuccessful TCM follow-up call:  No answer/busy

## 2021-09-18 ENCOUNTER — Telehealth: Payer: Self-pay

## 2021-09-18 NOTE — Telephone Encounter (Signed)
Transition Care Management Follow-up Telephone Call Date of discharge and from where:   09/12/21 / [facility]  How have you been since you were released from the hospital? "Not good, having reflux issues " Any questions or concerns? Yes, having gastrointestinal issues. I was diagnosed with GERD. My acid reflux is really bad.    Patient stated she did not have a follow up appointment scheduled with her primary care provider.  Unable to complete TOC call.  Call dropped while on the phone with patient. Attempted to call back x 3 leaving RNCM's  direct return call back phone number.   Notification email  sent to Kym Groom and Edgar Frisk requesting patient PCP follow appointment.  Items Reviewed: Did the pt receive and understand the discharge instructions provided?  Medications obtained and verified?  Other?  Any new allergies since your discharge?  Dietary orders reviewed?  Do you have support at home?   Home Care and Equipment/Supplies: Were home health services ordered?  If so, what is the name of the agency?   Has the agency set up a time to come to the patient's home?  Were any new equipment or medical supplies ordered?   What is the name of the medical supply agency?  Were you able to get the supplies/equipment?  Do you have any questions related to the use of the equipment or supplies?   Functional Questionnaire: (I = Independent and D = Dependent) ADLs:   Bathing/Dressing-   Meal Prep-   Eating-   Maintaining continence-   Transferring/Ambulation-   Managing Meds-   Follow up appointments reviewed:  PCP Hospital f/u appt confirmed? No  Scheduled to see Patient does not have a post hospital follow up visit scheduled.  [facility] f/u appt confirmed?  Scheduled to see  Are transportation arrangements needed?  If their condition worsens, is the pt aware to call PCP or go to the Emergency Dept.?  Was the patient provided with contact  information for the PCP's office or ED?  Was to pt encouraged to call back with questions or concerns?

## 2021-10-26 ENCOUNTER — Encounter (HOSPITAL_COMMUNITY): Payer: Self-pay

## 2021-10-26 ENCOUNTER — Other Ambulatory Visit: Payer: Self-pay

## 2021-10-26 ENCOUNTER — Emergency Department (HOSPITAL_COMMUNITY)
Admission: EM | Admit: 2021-10-26 | Discharge: 2021-10-27 | Disposition: A | Discharge status: Home / Self Care | Payer: BC Managed Care – PPO | Attending: Emergency Medicine | Admitting: Emergency Medicine

## 2021-10-26 DIAGNOSIS — N3000 Acute cystitis without hematuria: Secondary | ICD-10-CM | POA: Diagnosis not present

## 2021-10-26 DIAGNOSIS — E1065 Type 1 diabetes mellitus with hyperglycemia: Secondary | ICD-10-CM | POA: Insufficient documentation

## 2021-10-26 DIAGNOSIS — R112 Nausea with vomiting, unspecified: Secondary | ICD-10-CM | POA: Diagnosis present

## 2021-10-26 DIAGNOSIS — Z794 Long term (current) use of insulin: Secondary | ICD-10-CM | POA: Diagnosis not present

## 2021-10-26 DIAGNOSIS — R000 Tachycardia, unspecified: Secondary | ICD-10-CM | POA: Insufficient documentation

## 2021-10-26 DIAGNOSIS — R739 Hyperglycemia, unspecified: Secondary | ICD-10-CM

## 2021-10-26 DIAGNOSIS — R1110 Vomiting, unspecified: Secondary | ICD-10-CM

## 2021-10-26 LAB — COMPREHENSIVE METABOLIC PANEL
ALT: 14 U/L (ref 0–44)
AST: 15 U/L (ref 15–41)
Albumin: 4.9 g/dL (ref 3.5–5.0)
Alkaline Phosphatase: 90 U/L (ref 38–126)
Anion gap: 12 (ref 5–15)
BUN: 29 mg/dL — ABNORMAL HIGH (ref 6–20)
CO2: 23 mmol/L (ref 22–32)
Calcium: 11.1 mg/dL — ABNORMAL HIGH (ref 8.9–10.3)
Chloride: 107 mmol/L (ref 98–111)
Creatinine, Ser: 1.36 mg/dL — ABNORMAL HIGH (ref 0.44–1.00)
GFR, Estimated: 54 mL/min — ABNORMAL LOW (ref >60)
Glucose, Bld: 266 mg/dL — ABNORMAL HIGH (ref 70–99)
Potassium: 3.9 mmol/L (ref 3.5–5.1)
Sodium: 142 mmol/L (ref 135–145)
Total Bilirubin: 0.7 mg/dL (ref 0.3–1.2)
Total Protein: 9.7 g/dL — ABNORMAL HIGH (ref 6.5–8.1)

## 2021-10-26 LAB — CBC WITH DIFFERENTIAL/PLATELET
Abs Immature Granulocytes: 0.06 10*3/uL (ref 0.00–0.07)
Basophils Absolute: 0 10*3/uL (ref 0.0–0.1)
Basophils Relative: 0 %
Eosinophils Absolute: 0 10*3/uL (ref 0.0–0.5)
Eosinophils Relative: 0 %
HCT: 45 % (ref 36.0–46.0)
Hemoglobin: 15.1 g/dL — ABNORMAL HIGH (ref 12.0–15.0)
Immature Granulocytes: 1 %
Lymphocytes Relative: 10 %
Lymphs Abs: 1.1 10*3/uL (ref 0.7–4.0)
MCH: 30.1 pg (ref 26.0–34.0)
MCHC: 33.6 g/dL (ref 30.0–36.0)
MCV: 89.8 fL (ref 80.0–100.0)
Monocytes Absolute: 0.4 10*3/uL (ref 0.1–1.0)
Monocytes Relative: 3 %
Neutro Abs: 9.3 10*3/uL — ABNORMAL HIGH (ref 1.7–7.7)
Neutrophils Relative %: 86 %
Platelets: 348 10*3/uL (ref 150–400)
RBC: 5.01 MIL/uL (ref 3.87–5.11)
RDW: 13.2 % (ref 11.5–15.5)
WBC: 10.9 10*3/uL — ABNORMAL HIGH (ref 4.0–10.5)
nRBC: 0 % (ref 0.0–0.2)

## 2021-10-26 LAB — I-STAT BETA HCG BLOOD, ED (MC, WL, AP ONLY): I-stat hCG, quantitative: <5 m[IU]/mL (ref <5)

## 2021-10-26 LAB — I-STAT CHEM 8, ED
BUN: 29 mg/dL — ABNORMAL HIGH (ref 6–20)
Calcium, Ion: 1.19 mmol/L (ref 1.15–1.40)
Chloride: 107 mmol/L (ref 98–111)
Creatinine, Ser: 1.3 mg/dL — ABNORMAL HIGH (ref 0.44–1.00)
Glucose, Bld: 266 mg/dL — ABNORMAL HIGH (ref 70–99)
HCT: 46 % (ref 36.0–46.0)
Hemoglobin: 15.6 g/dL — ABNORMAL HIGH (ref 12.0–15.0)
Potassium: 3.9 mmol/L (ref 3.5–5.1)
Sodium: 142 mmol/L (ref 135–145)
TCO2: 22 mmol/L (ref 22–32)

## 2021-10-26 LAB — BLOOD GAS, VENOUS
Acid-Base Excess: 6.8 mmol/L — ABNORMAL HIGH (ref 0.0–2.0)
Bicarbonate: 31.3 mmol/L — ABNORMAL HIGH (ref 20.0–28.0)
O2 Saturation: 44.2 %
Patient temperature: 37
pCO2, Ven: 43 mmHg — ABNORMAL LOW (ref 44–60)
pH, Ven: 7.47 — ABNORMAL HIGH (ref 7.25–7.43)
pO2, Ven: 33 mmHg (ref 32–45)

## 2021-10-26 LAB — CBG MONITORING, ED: Glucose-Capillary: 276 mg/dL — ABNORMAL HIGH (ref 70–99)

## 2021-10-26 LAB — BETA-HYDROXYBUTYRIC ACID: Beta-Hydroxybutyric Acid: 1.45 mmol/L — ABNORMAL HIGH (ref 0.05–0.27)

## 2021-10-26 LAB — LIPASE, BLOOD: Lipase: 18 U/L (ref 11–51)

## 2021-10-26 MED ORDER — LACTATED RINGERS IV BOLUS
1000.0000 mL | Freq: Once | INTRAVENOUS | Status: AC
  Administered 2021-10-26: 1000 mL via INTRAVENOUS

## 2021-10-26 MED ORDER — HALOPERIDOL LACTATE 5 MG/ML IJ SOLN
5.0000 mg | Freq: Once | INTRAMUSCULAR | Status: AC
  Administered 2021-10-26: 5 mg via INTRAVENOUS
  Filled 2021-10-26: qty 1

## 2021-10-26 MED ORDER — LACTATED RINGERS IV BOLUS
1000.0000 mL | Freq: Once | INTRAVENOUS | Status: AC
  Administered 2021-10-27: 1000 mL via INTRAVENOUS

## 2021-10-26 MED ORDER — METOCLOPRAMIDE HCL 5 MG/ML IJ SOLN
10.0000 mg | Freq: Once | INTRAMUSCULAR | Status: AC
  Administered 2021-10-26: 10 mg via INTRAVENOUS
  Filled 2021-10-26: qty 2

## 2021-10-26 MED ORDER — DIPHENHYDRAMINE HCL 50 MG/ML IJ SOLN
12.5000 mg | Freq: Once | INTRAMUSCULAR | Status: AC
  Administered 2021-10-26: 12.5 mg via INTRAVENOUS
  Filled 2021-10-26: qty 1

## 2021-10-26 NOTE — ED Provider Notes (Signed)
[facility]-EMERGENCY DEPT Provider Note   CSN: [id number] Arrival date & time: 10/26/21  2038     History  Chief Complaint  Patient presents with   Hyperglycemia    Margherita Nordlund is a 31 y.o. female.  Patient here with high blood sugar, nausea and vomiting.  She is concerned she might be in DKA.  She currently has started insulin pump recently.  Her blood sugar were high in the 300s but currently now in the 200s.  She denies any alcohol or drug use.  Denies smoking marijuana.  States may be gastroparesis.  She does not have any real focal abdominal pain.  Denies any pain with urination.  Denies any fevers or chills.  Nothing has made it worse or better.  The history is provided by the patient.       Home Medications Prior to Admission medications   Medication Sig Start Date End Date Taking? Authorizing Provider  acetaminophen (TYLENOL) 500 MG tablet Take 1,000 mg by mouth every 6 (six) hours as needed (cramps).    [provider]  Albuterol Sulfate (PROAIR RESPICLICK) 108 (90 Base) MCG/ACT AEPB Inhale 1-2 puffs into the lungs every 6 (six) hours as needed. Patient not taking: Reported on 09/04/2021 09/22/19   Nche, Bonna Gains, NP  amLODipine (NORVASC) 5 MG tablet Take 5 mg by mouth every evening. 07/13/21   [provider]  Continuous Blood Gluc Sensor (FREESTYLE LIBRE 14 DAY SENSOR) MISC by Other route every fourteen (14) days. One libre sensor every 14 days 01/21/18   [provider]  etonogestrel (NEXPLANON) 68 MG IMPL implant 1 each by Subdermal route once. Implanted summer 2020    [provider]  fluticasone (FLONASE) 50 MCG/ACT nasal spray Place 2 sprays into both nostrils daily. Patient taking differently: Place 2 sprays into both nostrils daily as needed for allergies or rhinitis. 09/22/19   Nche, Bonna Gains, NP  insulin aspart (NOVOLOG FLEXPEN) 100 UNIT/ML FlexPen Inject 5-40 Units into the skin 3 (three) times  daily with meals. Dose is based on CBG and carbs (1:8)    [provider]  insulin glargine (LANTUS) 100 UNIT/ML Solostar Pen Inject 18 Units into the skin at bedtime.    [provider]  Insulin Pen Needle (ULTRA-THIN II SHORT PEN NEEDLE) 31G X 8 MM MISC Inject into the skin. 01/21/18   [provider]  lisinopril (ZESTRIL) 40 MG tablet Take 40 mg by mouth every morning. 07/13/21   [provider]  metoCLOPramide (REGLAN) 5 MG tablet Take 1 tablet (5 mg total) by mouth 4 (four) times daily -  before meals and at bedtime. 09/10/21   Osvaldo Shipper, MD  pantoprazole (PROTONIX) 40 MG tablet Take 1 tablet (40 mg total) by mouth daily as needed (heart burn). 08/05/21   Rolly Salter, MD  PARoxetine (PAXIL) 20 MG tablet Take 20 mg by mouth every evening. 07/13/21   [provider]  polyethylene glycol powder (GLYCOLAX/MIRALAX) 17 GM/SCOOP powder Take 17 g by mouth daily. Patient not taking: Reported on 09/04/2021 09/22/19   Nche, Bonna Gains, NP  sucralfate (CARAFATE) 1 g tablet Take 1 tablet (1 g total) by mouth 4 (four) times daily -  with meals and at bedtime for 5 days. 09/12/21 09/17/21  Osvaldo Shipper, MD  ranitidine (ZANTAC) 75 MG tablet Take 75 mg by mouth daily as needed for heartburn.  06/15/20  [provider]      Allergies    Aleve [naproxen  sodium]    Review of Systems   Review of Systems  Physical Exam Updated Vital Signs BP (!) 173/114   Pulse (!) 120   Temp 98.8 F (37.1 C) (Oral)   Resp 20   Ht 5\' 4"  (1.626 m)   Wt 55.1 kg   SpO2 100%   BMI 20.85 kg/m  Physical Exam Vitals and nursing note reviewed.  Constitutional:      General: She is not in acute distress.    Appearance: She is well-developed. She is not ill-appearing.  HENT:     Head: Normocephalic and atraumatic.     Nose: Nose normal.     Mouth/Throat:     Mouth: Mucous membranes are moist.  Eyes:     Extraocular Movements: Extraocular movements intact.      Conjunctiva/sclera: Conjunctivae normal.     Pupils: Pupils are equal, round, and reactive to light.  Cardiovascular:     Rate and Rhythm: Normal rate and regular rhythm.     Pulses: Normal pulses.     Heart sounds: Normal heart sounds. No murmur heard. Pulmonary:     Effort: Pulmonary effort is normal. No respiratory distress.     Breath sounds: Normal breath sounds.  Abdominal:     General: Abdomen is flat.     Palpations: Abdomen is soft.     Tenderness: There is no abdominal tenderness.  Musculoskeletal:        General: No swelling.     Cervical back: Neck supple.  Skin:    General: Skin is warm and dry.     Capillary Refill: Capillary refill takes less than 2 seconds.  Neurological:     General: No focal deficit present.     Mental Status: She is alert.  Psychiatric:        Mood and Affect: Mood normal.     ED Results / Procedures / Treatments   Labs (all labs ordered are listed, but only abnormal results are displayed) Labs Reviewed  CBC WITH DIFFERENTIAL/PLATELET - Abnormal; Notable for the following components:      Result Value   WBC 10.9 (*)    Hemoglobin 15.1 (*)    Neutro Abs 9.3 (*)    All other components within normal limits  COMPREHENSIVE METABOLIC PANEL - Abnormal; Notable for the following components:   Glucose, Bld 266 (*)    BUN 29 (*)    Creatinine, Ser 1.36 (*)    Calcium 11.1 (*)    Total Protein 9.7 (*)    GFR, Estimated 54 (*)    All other components within normal limits  BLOOD GAS, VENOUS - Abnormal; Notable for the following components:   pH, Ven 7.47 (*)    pCO2, Ven 43 (*)    Bicarbonate 31.3 (*)    Acid-Base Excess 6.8 (*)    All other components within normal limits  CBG MONITORING, ED - Abnormal; Notable for the following components:   Glucose-Capillary 276 (*)    All other components within normal limits  I-STAT CHEM 8, ED - Abnormal; Notable for the following components:   BUN 29 (*)    Creatinine, Ser 1.30 (*)    Glucose, Bld  266 (*)    Hemoglobin 15.6 (*)    All other components within normal limits  LIPASE, BLOOD  URINALYSIS, ROUTINE W REFLEX MICROSCOPIC  BETA-HYDROXYBUTYRIC ACID  I-STAT BETA HCG BLOOD, ED ([facility], [facility], [facility] ONLY)    EKG None  Radiology No results found.  Procedures Procedures  Medications Ordered in ED Medications  haloperidol lactate (HALDOL) injection 5 mg (has no administration in time range)  diphenhydrAMINE (BENADRYL) injection 12.5 mg (has no administration in time range)  lactated ringers bolus 1,000 mL (1,000 mLs Intravenous New Bag/Given 10/26/21 2207)  metoCLOPramide (REGLAN) injection 10 mg (10 mg Intravenous Given 10/26/21 2208)    ED Course/ Medical Decision Making/ A&P                           Medical Decision Making Risk Prescription drug management.  Keiera Delange is a 31 year old female with history of type 1 diabetes, hyperemesis who presents with nausea and vomiting and concern for DKA.  Patient arrives tachycardic to the 120s, mildly hypertensive.  She has been having nausea and vomiting since last night.  She denies any alcohol or drug use.  Nothing makes it worse or better.  Not having any focal abdominal tenderness on exam.  Blood sugar upon arrival is 266.  Differential diagnosis is DKA versus gastroparesis versus hyperemesis from marijuana.  I have no concern for appendicitis or other acute intra-abdominal process at this time.  Will get CBC, CMP, blood gas, ketones, urinalysis, pregnancy test.  Will give lactated ringer bolus, IV Reglan.  Patient per my review and interpretation of labs with no significant leukocytosis or anemia or electrolyte abnormality.  Blood sugars 266 but patient is not in DKA.  No anion gap.  Bicarb is normal.  pH is 7.4.  Overall my suspicion is that this is hyperemesis from gastroparesis or marijuana.  She is feeling better briefly after Reglan but after having something to drink she had continued emesis.  I will give her IV Haldol and  IV Benadryl to further help her symptoms.  Pt handed off to oncoming ED staff pending re-evaluation.  This chart was dictated using voice recognition software.  Despite best efforts to proofread,  errors can occur which can change the documentation meaning.         Final Clinical Impression(s) / ED Diagnoses Final diagnoses:  Hyperemesis    Rx / DC Orders ED Discharge Orders     None         Virgina Norfolk, DO 10/26/21 2241

## 2021-10-26 NOTE — ED Triage Notes (Signed)
Pt presents to ED from home with c/o hyperglycemia. Pt endorses N/V and generalized abdominal pain that began lastnight around midnight. Pt is a type 1 diabetic.

## 2021-10-26 NOTE — ED Provider Triage Note (Signed)
Emergency Medicine Provider Triage Evaluation Note  Laurie Penado , a 31 y.o. female  was evaluated in triage.  Pt complains of upper glycemia.  Type I diabetic.  States blood sugars have been in the high 300s, 400s today.  Has had nausea, vomiting.  She feels like she is in DKA which she has had previously.  Last admission in April.  Compliant with her home meds.  Review of Systems  Positive: Hyperglycemia, N/V Negative:   Physical Exam  There were no vitals taken for this visit. Gen:   Awake, no distress   Resp:  Normal effort  MSK:   Moves extremities without difficulty  Other:    Medical Decision Making  Medically screening exam initiated at 9:12 PM.  Appropriate orders placed.  Kareemah Boeve was informed that the remainder of the evaluation will be completed by another provider, this initial triage assessment does not replace that evaluation, and the importance of remaining in the ED until their evaluation is complete.  Hyperglycemia    Atlanta Pelto A, PA-C 10/26/21 2113

## 2021-10-27 LAB — URINALYSIS, ROUTINE W REFLEX MICROSCOPIC
Bilirubin Urine: NEGATIVE
Glucose, UA: >=500 mg/dL — AB
Ketones, ur: 20 mg/dL — AB
Nitrite: NEGATIVE
Protein, ur: >=300 mg/dL — AB
Specific Gravity, Urine: 1.011 (ref 1.005–1.030)
WBC, UA: >50 WBC/hpf — ABNORMAL HIGH (ref 0–5)
pH: 5 (ref 5.0–8.0)

## 2021-10-27 MED ORDER — CEPHALEXIN 500 MG PO CAPS: 500.0000 mg | ORAL_CAPSULE | Freq: Two times a day (BID) | ORAL | 0 refills | Status: AC

## 2021-10-27 MED ORDER — CEPHALEXIN 500 MG PO CAPS
500.0000 mg | ORAL_CAPSULE | Freq: Once | ORAL | Status: AC
  Administered 2021-10-27: 500 mg via ORAL
  Filled 2021-10-27: qty 1

## 2021-10-27 NOTE — ED Provider Notes (Signed)
Care of the patient assumed at the change of shift. Here for vomiting, history of DM also has frequent vomiting and possible gastroparesis vs CHS. At shift change she is pending IVF, PO trial and UA.  Physical Exam  BP 134/80   Pulse (!) 118   Temp 98.8 F (37.1 C) (Oral)   Resp 17   Ht 5\' 4"  (1.626 m)   Wt 55.1 kg   SpO2 100%   BMI 20.85 kg/m   Physical Exam  Procedures  Procedures  ED Course / MDM   Clinical Course as of 10/27/21 0134  Fri Oct 27, 2021  0131 UA shows signs of UTI. She has had increased urinary frequency but no burning. Could be due to hyperglycemia but given her UA results, will treat empirically for UTI. Otherwise she is tolerating PO fluids. HR improved, but still mildly elevated, similar to previous, and she is ready to go home.  [CS]    Clinical Course User Index [CS] 0132, MD   Medical Decision Making Given presenting complaint, I considered that admission might be necessary. After review of results from ED lab and/or imaging studies, admission to the hospital is not indicated at this time.    Problems Addressed: Acute cystitis without hematuria: acute illness or injury Hyperemesis: chronic illness or injury with exacerbation, progression, or side effects of treatment Hyperglycemia: chronic illness or injury with exacerbation, progression, or side effects of treatment  Amount and/or Complexity of Data Reviewed Labs: ordered. Decision-making details documented in ED Course.  Risk Prescription drug management. Decision regarding hospitalization.          Pollyann Savoy, MD 10/27/21 [phone]

## 2021-12-01 ENCOUNTER — Encounter (HOSPITAL_COMMUNITY): Payer: Self-pay

## 2021-12-01 ENCOUNTER — Emergency Department (HOSPITAL_COMMUNITY)
Admission: EM | Admit: 2021-12-01 | Discharge: 2021-12-02 | Disposition: A | Discharge status: Home / Self Care | Payer: Medicaid Other | Source: Home / Self Care | Attending: Emergency Medicine | Admitting: Emergency Medicine

## 2021-12-01 DIAGNOSIS — E109 Type 1 diabetes mellitus without complications: Secondary | ICD-10-CM | POA: Insufficient documentation

## 2021-12-01 DIAGNOSIS — R1013 Epigastric pain: Secondary | ICD-10-CM | POA: Insufficient documentation

## 2021-12-01 DIAGNOSIS — Z79899 Other long term (current) drug therapy: Secondary | ICD-10-CM | POA: Insufficient documentation

## 2021-12-01 DIAGNOSIS — N9489 Other specified conditions associated with female genital organs and menstrual cycle: Secondary | ICD-10-CM | POA: Insufficient documentation

## 2021-12-01 DIAGNOSIS — I10 Essential (primary) hypertension: Secondary | ICD-10-CM | POA: Insufficient documentation

## 2021-12-01 DIAGNOSIS — R112 Nausea with vomiting, unspecified: Secondary | ICD-10-CM | POA: Insufficient documentation

## 2021-12-01 LAB — CBG MONITORING, ED: Glucose-Capillary: 165 mg/dL — ABNORMAL HIGH (ref 70–99)

## 2021-12-01 LAB — CBC WITH DIFFERENTIAL/PLATELET
Abs Immature Granulocytes: 0.02 10*3/uL (ref 0.00–0.07)
Basophils Absolute: 0.1 10*3/uL (ref 0.0–0.1)
Basophils Relative: 1 %
Eosinophils Absolute: 0.1 10*3/uL (ref 0.0–0.5)
Eosinophils Relative: 1 %
HCT: 41.2 % (ref 36.0–46.0)
Hemoglobin: 13.7 g/dL (ref 12.0–15.0)
Immature Granulocytes: 0 %
Lymphocytes Relative: 36 %
Lymphs Abs: 2.9 10*3/uL (ref 0.7–4.0)
MCH: 29.8 pg (ref 26.0–34.0)
MCHC: 33.3 g/dL (ref 30.0–36.0)
MCV: 89.8 fL (ref 80.0–100.0)
Monocytes Absolute: 0.5 10*3/uL (ref 0.1–1.0)
Monocytes Relative: 6 %
Neutro Abs: 4.5 10*3/uL (ref 1.7–7.7)
Neutrophils Relative %: 56 %
Platelets: 309 10*3/uL (ref 150–400)
RBC: 4.59 MIL/uL (ref 3.87–5.11)
RDW: 13.3 % (ref 11.5–15.5)
WBC: 8.1 10*3/uL (ref 4.0–10.5)
nRBC: 0 % (ref 0.0–0.2)

## 2021-12-01 LAB — COMPREHENSIVE METABOLIC PANEL
ALT: 9 U/L (ref 0–44)
AST: 13 U/L — ABNORMAL LOW (ref 15–41)
Albumin: 4.2 g/dL (ref 3.5–5.0)
Alkaline Phosphatase: 61 U/L (ref 38–126)
Anion gap: 10 (ref 5–15)
BUN: 16 mg/dL (ref 6–20)
CO2: 22 mmol/L (ref 22–32)
Calcium: 10.1 mg/dL (ref 8.9–10.3)
Chloride: 107 mmol/L (ref 98–111)
Creatinine, Ser: 1.43 mg/dL — ABNORMAL HIGH (ref 0.44–1.00)
GFR, Estimated: 51 mL/min — ABNORMAL LOW (ref >60)
Glucose, Bld: 153 mg/dL — ABNORMAL HIGH (ref 70–99)
Potassium: 3.9 mmol/L (ref 3.5–5.1)
Sodium: 139 mmol/L (ref 135–145)
Total Bilirubin: 0.7 mg/dL (ref 0.3–1.2)
Total Protein: 7.6 g/dL (ref 6.5–8.1)

## 2021-12-01 LAB — SALICYLATE LEVEL: Salicylate Lvl: <7 mg/dL — ABNORMAL LOW (ref 7.0–30.0)

## 2021-12-01 LAB — I-STAT BETA HCG BLOOD, ED (MC, WL, AP ONLY): I-stat hCG, quantitative: <5 m[IU]/mL (ref <5)

## 2021-12-01 NOTE — ED Provider Triage Note (Signed)
Emergency Medicine Provider Triage Evaluation Note  [name] , a 31 y.o. female  was evaluated in triage.  Pt complains of nausea and vomiting as well as epigastric abdominal pain.  Patient is a type I diabetic with history of gastroparesis.  Reports since last night she has been vomiting and unable to keep anything down.  She reports yesterday her blood sugars were high in the 300s, blood sugars have stabilized today but despite trying to take Zofran and Protonix at home she has been unable to resolve vomiting.  Review of Systems  Positive: Nausea, vomiting, abdominal pain Negative: Chest pain, shortness of breath  Physical Exam  BP 117/78 (BP Location: Left Arm)   Pulse (!) 107   Temp 99.2 F (37.3 C) (Oral)   Resp 16   SpO2 100%  Gen:   Awake, no distress  Resp:  Normal effort  MSK:   Moves extremities without difficulty  Other:  Epigastric tenderness  Medical Decision Making  Medically screening exam initiated at 5:53 PM.  Appropriate orders placed.  Jonathan Rummell was informed that the remainder of the evaluation will be completed by another provider, this initial triage assessment does not replace that evaluation, and the importance of remaining in the ED until their evaluation is complete.     Dartha Lodge, [place] 12/01/21 1810

## 2021-12-01 NOTE — ED Triage Notes (Signed)
Pt arrived via POV, c/o n/v, states she has been having issues with keeping blood sugars regulated.

## 2021-12-02 ENCOUNTER — Other Ambulatory Visit: Payer: Self-pay

## 2021-12-02 ENCOUNTER — Inpatient Hospital Stay (HOSPITAL_COMMUNITY)
Admission: EM | Admit: 2021-12-02 | Discharge: 2021-12-04 | DRG: 074 | Disposition: A | Discharge status: Home / Self Care | Payer: Medicaid Other | Attending: Internal Medicine | Admitting: Internal Medicine

## 2021-12-02 ENCOUNTER — Encounter (HOSPITAL_COMMUNITY): Payer: Self-pay

## 2021-12-02 DIAGNOSIS — Z79899 Other long term (current) drug therapy: Secondary | ICD-10-CM

## 2021-12-02 DIAGNOSIS — R1013 Epigastric pain: Secondary | ICD-10-CM | POA: Diagnosis present

## 2021-12-02 DIAGNOSIS — Z8249 Family history of ischemic heart disease and other diseases of the circulatory system: Secondary | ICD-10-CM

## 2021-12-02 DIAGNOSIS — D6859 Other primary thrombophilia: Secondary | ICD-10-CM | POA: Diagnosis present

## 2021-12-02 DIAGNOSIS — K3184 Gastroparesis: Secondary | ICD-10-CM | POA: Diagnosis not present

## 2021-12-02 DIAGNOSIS — K5900 Constipation, unspecified: Secondary | ICD-10-CM | POA: Diagnosis present

## 2021-12-02 DIAGNOSIS — E1065 Type 1 diabetes mellitus with hyperglycemia: Secondary | ICD-10-CM | POA: Diagnosis present

## 2021-12-02 DIAGNOSIS — K219 Gastro-esophageal reflux disease without esophagitis: Secondary | ICD-10-CM | POA: Diagnosis present

## 2021-12-02 DIAGNOSIS — R112 Nausea with vomiting, unspecified: Secondary | ICD-10-CM | POA: Diagnosis not present

## 2021-12-02 DIAGNOSIS — J45909 Unspecified asthma, uncomplicated: Secondary | ICD-10-CM | POA: Diagnosis present

## 2021-12-02 DIAGNOSIS — E0590 Thyrotoxicosis, unspecified without thyrotoxic crisis or storm: Secondary | ICD-10-CM | POA: Diagnosis present

## 2021-12-02 DIAGNOSIS — F17210 Nicotine dependence, cigarettes, uncomplicated: Secondary | ICD-10-CM | POA: Diagnosis present

## 2021-12-02 DIAGNOSIS — E1143 Type 2 diabetes mellitus with diabetic autonomic (poly)neuropathy: Secondary | ICD-10-CM | POA: Diagnosis present

## 2021-12-02 DIAGNOSIS — Z794 Long term (current) use of insulin: Secondary | ICD-10-CM

## 2021-12-02 DIAGNOSIS — E559 Vitamin D deficiency, unspecified: Secondary | ICD-10-CM | POA: Diagnosis present

## 2021-12-02 DIAGNOSIS — Z9641 Presence of insulin pump (external) (internal): Secondary | ICD-10-CM | POA: Diagnosis present

## 2021-12-02 DIAGNOSIS — Z886 Allergy status to analgesic agent status: Secondary | ICD-10-CM

## 2021-12-02 DIAGNOSIS — E1043 Type 1 diabetes mellitus with diabetic autonomic (poly)neuropathy: Principal | ICD-10-CM | POA: Diagnosis present

## 2021-12-02 DIAGNOSIS — I10 Essential (primary) hypertension: Secondary | ICD-10-CM | POA: Diagnosis present

## 2021-12-02 LAB — CBC WITH DIFFERENTIAL/PLATELET
Abs Immature Granulocytes: 0.02 10*3/uL (ref 0.00–0.07)
Basophils Absolute: 0 10*3/uL (ref 0.0–0.1)
Basophils Relative: 1 %
Eosinophils Absolute: 0 10*3/uL (ref 0.0–0.5)
Eosinophils Relative: 1 %
HCT: 39.3 % (ref 36.0–46.0)
Hemoglobin: 13.1 g/dL (ref 12.0–15.0)
Immature Granulocytes: 0 %
Lymphocytes Relative: 24 %
Lymphs Abs: 1.5 10*3/uL (ref 0.7–4.0)
MCH: 30 pg (ref 26.0–34.0)
MCHC: 33.3 g/dL (ref 30.0–36.0)
MCV: 90.1 fL (ref 80.0–100.0)
Monocytes Absolute: 0.3 10*3/uL (ref 0.1–1.0)
Monocytes Relative: 4 %
Neutro Abs: 4.6 10*3/uL (ref 1.7–7.7)
Neutrophils Relative %: 70 %
Platelets: 311 10*3/uL (ref 150–400)
RBC: 4.36 MIL/uL (ref 3.87–5.11)
RDW: 13.1 % (ref 11.5–15.5)
WBC: 6.5 10*3/uL (ref 4.0–10.5)
nRBC: 0 % (ref 0.0–0.2)

## 2021-12-02 LAB — I-STAT BETA HCG BLOOD, ED (MC, WL, AP ONLY): I-stat hCG, quantitative: <5 m[IU]/mL (ref <5)

## 2021-12-02 LAB — COMPREHENSIVE METABOLIC PANEL
ALT: 9 U/L (ref 0–44)
AST: 12 U/L — ABNORMAL LOW (ref 15–41)
Albumin: 4.1 g/dL (ref 3.5–5.0)
Alkaline Phosphatase: 58 U/L (ref 38–126)
Anion gap: 10 (ref 5–15)
BUN: 11 mg/dL (ref 6–20)
CO2: 20 mmol/L — ABNORMAL LOW (ref 22–32)
Calcium: 9.6 mg/dL (ref 8.9–10.3)
Chloride: 107 mmol/L (ref 98–111)
Creatinine, Ser: 1.05 mg/dL — ABNORMAL HIGH (ref 0.44–1.00)
GFR, Estimated: >60 mL/min (ref >60)
Glucose, Bld: 165 mg/dL — ABNORMAL HIGH (ref 70–99)
Potassium: 3.9 mmol/L (ref 3.5–5.1)
Sodium: 137 mmol/L (ref 135–145)
Total Bilirubin: 0.9 mg/dL (ref 0.3–1.2)
Total Protein: 7.5 g/dL (ref 6.5–8.1)

## 2021-12-02 LAB — URINALYSIS, ROUTINE W REFLEX MICROSCOPIC
Bilirubin Urine: NEGATIVE
Glucose, UA: NEGATIVE mg/dL
Ketones, ur: 5 mg/dL — AB
Leukocytes,Ua: NEGATIVE
Nitrite: NEGATIVE
Protein, ur: 100 mg/dL — AB
Specific Gravity, Urine: 1.015 (ref 1.005–1.030)
pH: 5 (ref 5.0–8.0)

## 2021-12-02 LAB — LIPASE, BLOOD
Lipase: 20 U/L (ref 11–51)
Lipase: 21 U/L (ref 11–51)

## 2021-12-02 LAB — GLUCOSE, CAPILLARY
Glucose-Capillary: 103 mg/dL — ABNORMAL HIGH (ref 70–99)
Glucose-Capillary: 142 mg/dL — ABNORMAL HIGH (ref 70–99)

## 2021-12-02 MED ORDER — SODIUM CHLORIDE 0.9 % IV BOLUS
1000.0000 mL | Freq: Once | INTRAVENOUS | Status: AC
  Administered 2021-12-02: 1000 mL via INTRAVENOUS

## 2021-12-02 MED ORDER — LACTATED RINGERS IV SOLN: INTRAVENOUS | Status: DC

## 2021-12-02 MED ORDER — ONDANSETRON 8 MG PO TBDP
8.0000 mg | ORAL_TABLET | Freq: Once | ORAL | Status: AC
  Administered 2021-12-02: 8 mg via ORAL
  Filled 2021-12-02: qty 1

## 2021-12-02 MED ORDER — BISACODYL 10 MG RE SUPP
10.0000 mg | Freq: Every day | RECTAL | Status: DC | PRN
Start: 2021-12-02 — End: 2021-12-04

## 2021-12-02 MED ORDER — ONDANSETRON HCL 4 MG/2ML IJ SOLN
4.0000 mg | Freq: Four times a day (QID) | INTRAMUSCULAR | Status: DC | PRN
  Administered 2021-12-03 (×2): 4 mg via INTRAVENOUS
  Filled 2021-12-02 (×2): qty 2

## 2021-12-02 MED ORDER — INSULIN PUMP
SUBCUTANEOUS | Status: DC
Start: 2021-12-02 — End: 2021-12-04
  Filled 2021-12-02: qty 1

## 2021-12-02 MED ORDER — PNEUMOCOCCAL 20-VAL CONJ VACC 0.5 ML IM SUSY
0.5000 mL | PREFILLED_SYRINGE | INTRAMUSCULAR | Status: DC
  Filled 2021-12-02: qty 0.5

## 2021-12-02 MED ORDER — ACETAMINOPHEN 325 MG PO TABS: 650.0000 mg | ORAL_TABLET | Freq: Four times a day (QID) | ORAL | Status: DC | PRN

## 2021-12-02 MED ORDER — PANTOPRAZOLE SODIUM 40 MG IV SOLR
40.0000 mg | INTRAVENOUS | Status: DC
  Administered 2021-12-02 – 2021-12-03 (×2): 40 mg via INTRAVENOUS
  Filled 2021-12-02 (×2): qty 10

## 2021-12-02 MED ORDER — ONDANSETRON HCL 4 MG PO TABS: 4.0000 mg | ORAL_TABLET | Freq: Four times a day (QID) | ORAL | Status: DC | PRN

## 2021-12-02 MED ORDER — METOCLOPRAMIDE HCL 5 MG/ML IJ SOLN
10.0000 mg | Freq: Once | INTRAMUSCULAR | Status: AC
  Administered 2021-12-02: 10 mg via INTRAVENOUS
  Filled 2021-12-02: qty 2

## 2021-12-02 MED ORDER — HYDRALAZINE HCL 20 MG/ML IJ SOLN
10.0000 mg | INTRAMUSCULAR | Status: DC | PRN
  Administered 2021-12-03: 10 mg via INTRAVENOUS
  Filled 2021-12-02: qty 1

## 2021-12-02 MED ORDER — HYDROMORPHONE HCL 2 MG/ML IJ SOLN
1.0000 mg | Freq: Once | INTRAMUSCULAR | Status: AC
  Administered 2021-12-02: 1 mg via INTRAVENOUS
  Filled 2021-12-02: qty 1

## 2021-12-02 MED ORDER — METOCLOPRAMIDE HCL 5 MG/ML IJ SOLN
10.0000 mg | Freq: Four times a day (QID) | INTRAMUSCULAR | Status: DC
  Administered 2021-12-02 – 2021-12-03 (×3): 10 mg via INTRAVENOUS
  Filled 2021-12-02 (×3): qty 2

## 2021-12-02 MED ORDER — METOPROLOL TARTRATE 5 MG/5ML IV SOLN
5.0000 mg | Freq: Three times a day (TID) | INTRAVENOUS | Status: DC
  Administered 2021-12-02 – 2021-12-03 (×2): 5 mg via INTRAVENOUS
  Filled 2021-12-02 (×2): qty 5

## 2021-12-02 MED ORDER — METOPROLOL TARTRATE 5 MG/5ML IV SOLN
5.0000 mg | Freq: Once | INTRAVENOUS | Status: AC
  Administered 2021-12-02: 5 mg via INTRAVENOUS
  Filled 2021-12-02: qty 5

## 2021-12-02 MED ORDER — ONDANSETRON HCL 4 MG/2ML IJ SOLN
4.0000 mg | Freq: Once | INTRAMUSCULAR | Status: AC
Start: 2021-12-02 — End: 2021-12-02
  Administered 2021-12-02: 4 mg via INTRAVENOUS
  Filled 2021-12-02: qty 2

## 2021-12-02 MED ORDER — PANTOPRAZOLE SODIUM 40 MG IV SOLR
40.0000 mg | Freq: Once | INTRAVENOUS | Status: AC
  Administered 2021-12-02: 40 mg via INTRAVENOUS
  Filled 2021-12-02: qty 10

## 2021-12-02 MED ORDER — LACTATED RINGERS IV BOLUS
2000.0000 mL | Freq: Once | INTRAVENOUS | Status: AC
  Administered 2021-12-02: 2000 mL via INTRAVENOUS

## 2021-12-02 MED ORDER — ACETAMINOPHEN 650 MG RE SUPP: 650.0000 mg | Freq: Four times a day (QID) | RECTAL | Status: DC | PRN

## 2021-12-02 NOTE — ED Provider Notes (Signed)
[facility]-EMERGENCY DEPT Provider Note   CSN: [id number] Arrival date & time: 12/01/21  1653     History  Chief Complaint  Patient presents with   Emesis    [name] is a 31 y.o. female with a history of T1DM, HTN, cannabinoid hyperemesis syndrome, and GERD who presents to the ED with complaints of nausea and vomiting that began yesterday.  Patient reports multiple episodes of emesis, unable to keep anything down, having associated epigastric abdominal pain.  Reports she had some similar symptoms about a week ago that she was able to stave off with Zofran, Carafate, and Protonix.  Unfortunately she tried these medicines but has been vomiting them up.  She reports a history of gastroparesis, states this feels similar to her prior flares.  She denies fever, chills, hematemesis, melena, hematochezia, dysuria, or vaginal discharge.  HPI     Home Medications Prior to Admission medications   Medication Sig Start Date End Date Taking? Authorizing Provider  acetaminophen (TYLENOL) 500 MG tablet Take 1,000 mg by mouth every 6 (six) hours as needed (cramps).    [provider]  Albuterol Sulfate (PROAIR RESPICLICK) 108 (90 Base) MCG/ACT AEPB Inhale 1-2 puffs into the lungs every 6 (six) hours as needed. Patient not taking: Reported on 09/04/2021 09/22/19   Nche, Bonna Gains, NP  amLODipine (NORVASC) 5 MG tablet Take 5 mg by mouth every evening. 07/13/21   [provider]  Continuous Blood Gluc Sensor (FREESTYLE LIBRE 14 DAY SENSOR) MISC by Other route every fourteen (14) days. One libre sensor every 14 days 01/21/18   [provider]  etonogestrel (NEXPLANON) 68 MG IMPL implant 1 each by Subdermal route once. Implanted summer 2020    [provider]  fluticasone (FLONASE) 50 MCG/ACT nasal spray Place 2 sprays into both nostrils daily. Patient taking differently: Place 2 sprays into both nostrils daily as needed for allergies or  rhinitis. 09/22/19   Nche, Bonna Gains, NP  insulin aspart (NOVOLOG FLEXPEN) 100 UNIT/ML FlexPen Inject 5-40 Units into the skin 3 (three) times daily with meals. Dose is based on CBG and carbs (1:8)    [provider]  insulin glargine (LANTUS) 100 UNIT/ML Solostar Pen Inject 18 Units into the skin at bedtime.    [provider]  Insulin Pen Needle (ULTRA-THIN II SHORT PEN NEEDLE) 31G X 8 MM MISC Inject into the skin. 01/21/18   [provider]  lisinopril (ZESTRIL) 40 MG tablet Take 40 mg by mouth every morning. 07/13/21   [provider]  metoCLOPramide (REGLAN) 5 MG tablet Take 1 tablet (5 mg total) by mouth 4 (four) times daily -  before meals and at bedtime. 09/10/21   Osvaldo Shipper, MD  pantoprazole (PROTONIX) 40 MG tablet Take 1 tablet (40 mg total) by mouth daily as needed (heart burn). 08/05/21   Rolly Salter, MD  PARoxetine (PAXIL) 20 MG tablet Take 20 mg by mouth every evening. 07/13/21   [provider]  polyethylene glycol powder (GLYCOLAX/MIRALAX) 17 GM/SCOOP powder Take 17 g by mouth daily. Patient not taking: Reported on 09/04/2021 09/22/19   Nche, Bonna Gains, NP  sucralfate (CARAFATE) 1 g tablet Take 1 tablet (1 g total) by mouth 4 (four) times daily -  with meals and at bedtime for 5 days. 09/12/21 09/17/21  Osvaldo Shipper, MD  ranitidine (ZANTAC) 75 MG tablet Take 75 mg by mouth daily as needed for heartburn.  06/15/20  [provider]  Allergies    Aleve [naproxen sodium]    Review of Systems   Review of Systems  Constitutional:  Negative for chills and fever.  Respiratory:  Negative for shortness of breath.   Cardiovascular:  Negative for chest pain.  Gastrointestinal:  Positive for abdominal pain, nausea and vomiting. Negative for anal bleeding, blood in stool and diarrhea.  Genitourinary:  Negative for dysuria and vaginal discharge.  Neurological:  Negative for syncope.  All other systems reviewed and are  negative.   Physical Exam Updated Vital Signs BP (!) 142/83   Pulse 94   Temp 98.6 F (37 C) (Oral)   Resp 20   SpO2 100%  Physical Exam Vitals and nursing note reviewed.  Constitutional:      General: She is not in acute distress.    Appearance: She is well-developed. She is not toxic-appearing.  HENT:     Head: Normocephalic and atraumatic.  Eyes:     General:        Right eye: No discharge.        Left eye: No discharge.     Conjunctiva/sclera: Conjunctivae normal.  Cardiovascular:     Rate and Rhythm: Normal rate and regular rhythm.  Pulmonary:     Effort: No respiratory distress.     Breath sounds: Normal breath sounds. No wheezing or rales.  Abdominal:     General: There is no distension.     Palpations: Abdomen is soft.     Tenderness: There is no abdominal tenderness. There is no guarding or rebound. Negative signs include Murphy's sign.  Musculoskeletal:     Cervical back: Neck supple.  Skin:    General: Skin is warm and dry.  Neurological:     Mental Status: She is alert.     Comments: Clear speech.   Psychiatric:        Behavior: Behavior normal.     ED Results / Procedures / Treatments   Labs (all labs ordered are listed, but only abnormal results are displayed) Labs Reviewed  SALICYLATE LEVEL - Abnormal; Notable for the following components:      Result Value   Salicylate Lvl <7.0 (*)    All other components within normal limits  COMPREHENSIVE METABOLIC PANEL - Abnormal; Notable for the following components:   Glucose, Bld 153 (*)    Creatinine, Ser 1.43 (*)    AST 13 (*)    GFR, Estimated 51 (*)    All other components within normal limits  URINALYSIS, ROUTINE W REFLEX MICROSCOPIC - Abnormal; Notable for the following components:   APPearance HAZY (*)    Hgb urine dipstick LARGE (*)    Ketones, ur 5 (*)    Protein, ur 100 (*)    Bacteria, UA RARE (*)    All other components within normal limits  CBG MONITORING, ED - Abnormal; Notable for  the following components:   Glucose-Capillary 165 (*)    All other components within normal limits  CBC WITH DIFFERENTIAL/PLATELET  LIPASE, BLOOD  I-STAT BETA HCG BLOOD, ED ([facility], [facility], [facility] ONLY)    EKG None  Radiology No results found.  Procedures Procedures    Medications Ordered in ED Medications  sodium chloride 0.9 % bolus 1,000 mL (1,000 mLs Intravenous New Bag/Given 12/02/21 0034)  ondansetron (ZOFRAN) injection 4 mg (4 mg Intravenous Given 12/02/21 0036)  pantoprazole (PROTONIX) injection 40 mg (40 mg Intravenous Given 12/02/21 0036)    ED Course/ Medical Decision Making/ A&P  Medical Decision Making Risk Prescription drug management.  Patient presents to the ED with complaints of N/V w/ abdominal pain, this involves an extensive number of treatment options, and is a complaint that carries with it a high risk of complications and morbidity. Nontoxic, vitals w/ HTN, mild tachycardia improved on my exam.   Comorbidities: HTN, DM  Ddx including but not limited to: viral GI illness, gastroparesis, hyperemesis cannabinoid syndrome, gallbladder pathology, hepatitis, appendicitis, pregnancy (ectopic/IUP), GERD, PUD, pyelo.  Additional history obtained:  Chart/nursing notes reviewed Reviewed prior ED visit notes.  Viewed prior ED visit labs.  EKG: Normal QTc  Lab Tests:  I viewed & interpreted labs including:  CBC, CMP, lipase, pregnancy test: Mild increase in creatinine, hyperglycemic without acidosis or anion gap elevation- does not appear to be consistent w/ DKA.  LFTs and lipase without significant abnormality.  No leukocytosis.  Patient is not pregnant.  ED Course:  I ordered medications including Zofran for nausea and Protonix for pain with fluids for hydration.   Patient feeling much better, tolerating p.o., repeat abdominal exam remains without peritoneal signs not suspect acute surgical abdomen.  Overall seems reasonable for discharge at  this time. I discussed results, treatment plan, need for follow-up, and return precautions with the patient. Provided opportunity for questions, patient confirmed understanding and is in agreement with plan.    Based on patient's chief complaint, I considered admission might be necessary, however after reassuring ED workup feel patient is reasonable for discharge.   Portions of this note were generated with Scientist, clinical (histocompatibility and immunogenetics). Dictation errors may occur despite best attempts at proofreading.   Final Clinical Impression(s) / ED Diagnoses Final diagnoses:  Nausea and vomiting, unspecified vomiting type    Rx / DC Orders ED Discharge Orders     None         Cherly Anderson, PA-C 12/02/21 Ulis Rias    Tilden Fossa, MD 12/02/21 [phone]

## 2021-12-02 NOTE — ED Provider Notes (Signed)
[facility]-EMERGENCY DEPT Provider Note   CSN: [id number] Arrival date & time: 12/02/21  1009     History  Chief Complaint  Patient presents with   Abdominal Pain   Nausea   Emesis    [name] is a 31 y.o. female.  31 year old female with history of type 1 diabetes as well as gastroparesis who presents with intractable nausea vomiting.  Seen here several hours ago and treated.  Those labs were reviewed.  She had no signs of DKA.  Patient states went home and began to have persistent emesis.  Denies any fever or chills.  Emesis has been nonbilious or bloody.  At home treatments unaffected       Home Medications Prior to Admission medications   Medication Sig Start Date End Date Taking? Authorizing Provider  acetaminophen (TYLENOL) 500 MG tablet Take 1,000 mg by mouth every 6 (six) hours as needed (cramps).    [provider]  Albuterol Sulfate (PROAIR RESPICLICK) 108 (90 Base) MCG/ACT AEPB Inhale 1-2 puffs into the lungs every 6 (six) hours as needed. Patient not taking: Reported on 09/04/2021 09/22/19   Nche, Bonna Gains, NP  amLODipine (NORVASC) 5 MG tablet Take 5 mg by mouth every evening. 07/13/21   [provider]  Continuous Blood Gluc Sensor (FREESTYLE LIBRE 14 DAY SENSOR) MISC by Other route every fourteen (14) days. One libre sensor every 14 days 01/21/18   [provider]  etonogestrel (NEXPLANON) 68 MG IMPL implant 1 each by Subdermal route once. Implanted summer 2020    [provider]  fluticasone (FLONASE) 50 MCG/ACT nasal spray Place 2 sprays into both nostrils daily. Patient taking differently: Place 2 sprays into both nostrils daily as needed for allergies or rhinitis. 09/22/19   Nche, Bonna Gains, NP  insulin aspart (NOVOLOG FLEXPEN) 100 UNIT/ML FlexPen Inject 5-40 Units into the skin 3 (three) times daily with meals. Dose is based on CBG and carbs (1:8)    [provider]  insulin glargine  (LANTUS) 100 UNIT/ML Solostar Pen Inject 18 Units into the skin at bedtime.    [provider]  Insulin Pen Needle (ULTRA-THIN II SHORT PEN NEEDLE) 31G X 8 MM MISC Inject into the skin. 01/21/18   [provider]  lisinopril (ZESTRIL) 40 MG tablet Take 40 mg by mouth every morning. 07/13/21   [provider]  metoCLOPramide (REGLAN) 5 MG tablet Take 1 tablet (5 mg total) by mouth 4 (four) times daily -  before meals and at bedtime. 09/10/21   Osvaldo Shipper, MD  pantoprazole (PROTONIX) 40 MG tablet Take 1 tablet (40 mg total) by mouth daily as needed (heart burn). 08/05/21   Rolly Salter, MD  PARoxetine (PAXIL) 20 MG tablet Take 20 mg by mouth every evening. 07/13/21   [provider]  polyethylene glycol powder (GLYCOLAX/MIRALAX) 17 GM/SCOOP powder Take 17 g by mouth daily. Patient not taking: Reported on 09/04/2021 09/22/19   Nche, Bonna Gains, NP  sucralfate (CARAFATE) 1 g tablet Take 1 tablet (1 g total) by mouth 4 (four) times daily -  with meals and at bedtime for 5 days. 09/12/21 09/17/21  Osvaldo Shipper, MD  ranitidine (ZANTAC) 75 MG tablet Take 75 mg by mouth daily as needed for heartburn.  06/15/20  [provider]      Allergies    Aleve [naproxen sodium]    Review of Systems   Review of Systems  All other systems reviewed and are negative.  Physical Exam Updated Vital Signs BP (!) 182/109 (BP Location: Right Arm)   Pulse (!) 109   Temp 98.4 F (36.9 C) (Oral)   Resp 18   Ht 1.626 m (5\' 4" )   SpO2 100%   BMI 20.85 kg/m  Physical Exam Vitals and nursing note reviewed.  Constitutional:      General: She is not in acute distress.    Appearance: Normal appearance. She is well-developed. She is not toxic-appearing.  HENT:     Head: Normocephalic and atraumatic.  Eyes:     General: Lids are normal.     Conjunctiva/sclera: Conjunctivae normal.     Pupils: Pupils are equal, round, and reactive to light.  Neck:     Thyroid: No  thyroid mass.     Trachea: No tracheal deviation.  Cardiovascular:     Rate and Rhythm: Normal rate and regular rhythm.     Heart sounds: Normal heart sounds. No murmur heard.    No gallop.  Pulmonary:     Effort: Pulmonary effort is normal. No respiratory distress.     Breath sounds: Normal breath sounds. No stridor. No decreased breath sounds, wheezing, rhonchi or rales.  Abdominal:     General: There is no distension.     Palpations: Abdomen is soft.     Tenderness: There is no abdominal tenderness. There is no rebound.  Musculoskeletal:        General: No tenderness. Normal range of motion.     Cervical back: Normal range of motion and neck supple.  Skin:    General: Skin is warm and dry.     Findings: No abrasion or rash.  Neurological:     Mental Status: She is alert and oriented to person, place, and time. Mental status is at baseline.     GCS: GCS eye subscore is 4. GCS verbal subscore is 5. GCS motor subscore is 6.     Cranial Nerves: No cranial nerve deficit.     Sensory: No sensory deficit.     Motor: Motor function is intact.  Psychiatric:        Attention and Perception: Attention normal.        Speech: Speech normal.        Behavior: Behavior normal.     ED Results / Procedures / Treatments   Labs (all labs ordered are listed, but only abnormal results are displayed) Labs Reviewed  COMPREHENSIVE METABOLIC PANEL - Abnormal; Notable for the following components:      Result Value   CO2 20 (*)    Glucose, Bld 165 (*)    Creatinine, Ser 1.05 (*)    AST 12 (*)    All other components within normal limits  CBC WITH DIFFERENTIAL/PLATELET  LIPASE, BLOOD  URINALYSIS, ROUTINE W REFLEX MICROSCOPIC  I-STAT BETA HCG BLOOD, ED ([facility], [facility], [facility] ONLY)    EKG None  Radiology No results found.  Procedures Procedures    Medications Ordered in ED Medications  ondansetron (ZOFRAN-ODT) disintegrating tablet 8 mg (has no administration in time range)    ED Course/  Medical Decision Making/ A&P                           Medical Decision Making Risk Prescription drug management.   Patient presents with intractable nausea and vomiting.  Given IV fluids as well as pain medication antiemetics.  Patient has failed outpatient therapy and will require inpatient admission.  Patient agreeable  to this        Final Clinical Impression(s) / ED Diagnoses Final diagnoses:  None    Rx / DC Orders ED Discharge Orders     None         Lorre Nick, MD 12/02/21 1325

## 2021-12-02 NOTE — ED Provider Triage Note (Signed)
Emergency Medicine Provider Triage Evaluation Note  [name] , a 31 y.o. female  was evaluated in triage.  Pt complains of nausea, vomiting, and abdominal pain.  Symptoms have been ongoing since Thursday.  She says that symptoms feel like her gastroparesis that she had in the past.  She has epigastric abdominal pain.  She is seen here this morning given IV fluids and medication, and felt better.  She says as soon as she left she started throwing up again.  She has a history of type 1 diabetes as well.  Review of Systems  Positive: Abdominal pain, nausea, vomiting Negative:   Physical Exam  BP (!) 182/109 (BP Location: Right Arm)   Pulse (!) 109   Temp 98.4 F (36.9 C) (Oral)   Resp 18   Ht 5\' 4"  (1.626 m)   SpO2 100%   BMI 20.85 kg/m  Gen:   Awake, no distress   Resp:  Normal effort  MSK:   Moves extremities without difficulty  Other:    Medical Decision Making  Medically screening exam initiated at 10:57 AM.  Appropriate orders placed.  [name] was informed that the remainder of the evaluation will be completed by another provider, this initial triage assessment does not replace that evaluation, and the importance of remaining in the ED until their evaluation is complete.     Julian Reil, PA-C 12/02/21 1058

## 2021-12-02 NOTE — Progress Notes (Signed)
   12/02/21 1700  Insulin Pump Admission Assessment  Is patient suicidal? No  Patient desires continuing Insulin Pump Therapy Yes  Patient agrees to use hospital CBG meter Yes  Patient alert and oriented x 3 Yes  Patient can use hands/fingers to adjust pump settings Yes  Patient able to manage insulin pump Yes  [place] Pump Shift/PRN Assessment  Insulin pump functioning Yes  Infusion set connections secure/intact Yes  Signs and symptoms of infection at insertion site No  Date set/site changed (every 72 hours and prn) 12/05/21  Insulin pump set/site location RLQ  Pump managed by Patient  Patient has pump supplies at bedside Yes

## 2021-12-02 NOTE — Plan of Care (Signed)
  Problem: Pain Managment: Goal: General experience of comfort will improve Outcome: Progressing   Problem: Safety: Goal: Ability to remain free from injury will improve Outcome: Progressing   Problem: Skin Integrity: Goal: Risk for impaired skin integrity will decrease Outcome: Progressing   

## 2021-12-02 NOTE — H&P (Signed)
History and Physical    Patient: [name] ZOX:[id number] DOB: 1991-01-15 DOA: 12/02/2021 DOS: the patient was seen and examined on 12/02/2021 PCP: Medicine, [facility]  Patient coming from: Home  Chief Complaint:  Chief Complaint  Patient presents with   Abdominal Pain   Nausea   Emesis   HPI: [name] is a 31 y.o. female with medical history significant of asthma, type 1 diabetes, DKA episodes, hypertension, history of hypertensive urgency, GERD, cannabinoid hyperemesis syndrome, constipation, subclinical hyperthyroidism, primary hypercoagulable state, tobacco use disorder, vitamin D deficiency who is coming to the emergency department due to abdominal pain, nausea, multiple episodes of emesis since Thursday.  She has also been constipated since Monday.   No diarrhea, melena or hematochezia.  No flank pain, dysuria, frequency or hematuria.  She denied fever, chills, rhinorrhea, sore throat, wheezing or hemoptysis.  No chest pain, palpitations, diaphoresis, PND, orthopnea or pitting edema of the lower extremities. No polyuria, polydipsia, polyphagia or blurred vision.   ED course: Initial vital signs were temperature 98.4 F, pulse 99, respiration 18, BP 182/109 mmHg and O2 sat 100% on room air.  The patient received 2000 mL of LR bolus, metoclopramide 10 mg IVP and hydromorphone 1 mg IVP.  Lab work: Her CBC, lipase and hCG were normal.  CMP showed a CO2 of 20 mmol/L, glucose of 165 and creatinine 1.05 mg/dL.  The rest of the CMP results were unremarkable.   Review of Systems: As mentioned in the history of present illness. All other systems reviewed and are negative.  Past Medical History:  Diagnosis Date   Asthma    Diabetes mellitus    type 1    Essential hypertension, benign 02/10/2016   History reviewed. No pertinent surgical history. Social History:  reports that she has been smoking cigarettes. She uses smokeless tobacco. She reports current drug  use. Drug: Marijuana. She reports that she does not drink alcohol.  Allergies  Allergen Reactions   Aleve [Naproxen Sodium] Swelling    Family History  Problem Relation Age of Onset   Hypertension Paternal Grandmother     Prior to Admission medications   Medication Sig Start Date End Date Taking? Authorizing Provider  Albuterol Sulfate (PROAIR RESPICLICK) 108 (90 Base) MCG/ACT AEPB Inhale 1-2 puffs into the lungs every 6 (six) hours as needed. Patient taking differently: Inhale 2-3 puffs into the lungs as needed (cold symptoms). 09/22/19  Yes Nche, Bonna Gains, NP  amLODipine (NORVASC) 5 MG tablet Take 5 mg by mouth daily. 07/13/21  Yes [provider]  etonogestrel (NEXPLANON) 68 MG IMPL implant 1 each by Subdermal route once. Implanted summer 2020   Yes [provider]  Insulin Aspart (NOVOLOG IJ) Inject as directed. Inject before every meal and when blood sugar spikes per insulin pump.   Yes [provider]  lisinopril (ZESTRIL) 40 MG tablet Take 40 mg by mouth every morning. 07/13/21  Yes [provider]  ondansetron (ZOFRAN-ODT) 4 MG disintegrating tablet Take 4 mg by mouth 2 (two) times daily as needed for nausea or vomiting. 11/26/21  Yes [provider]  pantoprazole (PROTONIX) 40 MG tablet Take 1 tablet (40 mg total) by mouth daily as needed (heart burn). Patient taking differently: Take 40 mg by mouth daily. 08/05/21  Yes Rolly Salter, MD  PARoxetine (PAXIL) 20 MG tablet Take 20 mg by mouth daily. 07/13/21  Yes [provider]  sucralfate (CARAFATE) 1 g tablet Take 1 tablet (1 g total) by mouth  4 (four) times daily -  with meals and at bedtime for 5 days. Patient taking differently: Take 1 g by mouth daily as needed (heartburn). 09/12/21 12/02/21 Yes Osvaldo Shipper, MD  Continuous Blood Gluc Sensor (FREESTYLE LIBRE 14 DAY SENSOR) MISC by Other route every fourteen (14) days. One libre sensor every 14 days 01/21/18   [provider]  fluticasone (FLONASE) 50 MCG/ACT nasal spray Place 2 sprays into both nostrils daily. Patient not taking: Reported on 12/02/2021 09/22/19   Nche, Bonna Gains, NP  insulin aspart (NOVOLOG FLEXPEN) 100 UNIT/ML FlexPen Inject 5-40 Units into the skin 3 (three) times daily with meals. Dose is based on CBG and carbs (1:8) Patient not taking: Reported on 12/02/2021    [provider]  insulin glargine (LANTUS) 100 UNIT/ML Solostar Pen Inject 18 Units into the skin at bedtime. Patient not taking: Reported on 12/02/2021    [provider]  Insulin Pen Needle (ULTRA-THIN II SHORT PEN NEEDLE) 31G X 8 MM MISC Inject into the skin. 01/21/18   [provider]  metoCLOPramide (REGLAN) 5 MG tablet Take 1 tablet (5 mg total) by mouth 4 (four) times daily -  before meals and at bedtime. Patient not taking: Reported on 12/02/2021 09/10/21   Osvaldo Shipper, MD  polyethylene glycol powder (GLYCOLAX/MIRALAX) 17 GM/SCOOP powder Take 17 g by mouth daily. Patient not taking: Reported on 09/04/2021 09/22/19   Nche, Bonna Gains, NP  ranitidine (ZANTAC) 75 MG tablet Take 75 mg by mouth daily as needed for heartburn.  06/15/20  [provider]    Physical Exam: Vitals:   12/02/21 1020 12/02/21 1053 12/02/21 1400  BP: (!) 182/109  (!) 170/98  Pulse: (!) 109  (!) 105  Resp: 18  18  Temp: 98.4 F (36.9 C)  98.9 F (37.2 C)  TempSrc: Oral  Oral  SpO2: 100%  97%  Height:  5\' 4"  (1.626 m)    Physical Exam Vitals and nursing note reviewed.  Constitutional:      General: She is not in acute distress.    Appearance: She is well-developed and normal weight.  HENT:     Head: Normocephalic.     Nose: No rhinorrhea.     Mouth/Throat:     Mouth: Mucous membranes are dry.  Eyes:     General: No scleral icterus.    Pupils: Pupils are equal, round, and reactive to light.  Neck:     Vascular: No JVD.  Cardiovascular:     Rate and Rhythm: Normal rate and regular rhythm.      Heart sounds: S1 normal and S2 normal.  Pulmonary:     Effort: Pulmonary effort is normal.     Breath sounds: Normal breath sounds.  Abdominal:     General: Bowel sounds are normal. There is no distension.     Palpations: Abdomen is soft.     Tenderness: There is abdominal tenderness in the epigastric area. There is no right CVA tenderness, left CVA tenderness, guarding or rebound.  Musculoskeletal:     Cervical back: Neck supple.     Right lower leg: No edema.     Left lower leg: No edema.  Skin:    General: Skin is warm and dry.  Neurological:     General: No focal deficit present.     Mental Status: She is alert and oriented to person, place, and time.  Psychiatric:        Mood and Affect: Mood normal.  Behavior: Behavior normal.   Data Reviewed:  There are no new results to review at this time.  Assessment and Plan: Principal Problem:   Abdominal pain, epigastric Associated with:   Nausea and vomiting In the setting of:   Diabetic gastroparesis (HCC) Observation/MedSurg. Keep NPO for now. Continue IV fluids. Analgesics as needed. Antiemetics as needed. Pantoprazole 40 mg IVP every 24 hours. Keep electrolytes optimized. Follow-up CBC and CMP in AM.  Active Problems:   Uncontrolled type 1 diabetes mellitus with hyperglycemia (HCC) Currently NPO. Patient has insulin pump. CBG monitoring every 4 hours while NPO.    Essential hypertension, benign Metoprolol 5 mg IVP every 8 hours. As needed IV/PO hydralazine if not effective.    GERD (gastroesophageal reflux disease) Pantoprazole 40 mg IVP daily.    Advance Care Planning:   Code Status: Full Code   Consults:   Family Communication:   Severity of Illness: The appropriate patient status for this patient is OBSERVATION. Observation status is judged to be reasonable and necessary in order to provide the required intensity of service to ensure the patient's safety. The patient's presenting symptoms,  physical exam findings, and initial radiographic and laboratory data in the context of their medical condition is felt to place them at decreased risk for further clinical deterioration. Furthermore, it is anticipated that the patient will be medically stable for discharge from the hospital within 2 midnights of admission.   Author: Bobette Mo, MD 12/02/2021 2:37 PM  For on call review [email or web address].   This document was prepared using Dragon voice recognition software and may contain some unintended transcription errors.

## 2021-12-02 NOTE — ED Triage Notes (Signed)
Pt reports abdominal pain, nausea, and vomiting since Thursday. Pt was seen this morning but reports no improvement

## 2021-12-02 NOTE — Progress Notes (Signed)
Pt arrived to room 1502 via stretcher from the ED. Pt ambulated from stretcher to bed. Received report from [place], Charity fundraiser. See assessment. Will continue to monitor.

## 2021-12-02 NOTE — Discharge Instructions (Addendum)
You were seen in the emergency department today for nausea and vomiting with abdominal pain.  Your creatinine which looks at kidney function was mildly increased compared to prior and your blood sugar was somewhat elevated in the 150s.  Please have these rechecked by your primary care provider, additionally have your blood pressure rechecked as this was elevated.  Please continue your at home medication regimen.  Follow-up with primary care soon as possible as well as your gastroenterologist.  Return to the ER for any new or worsening symptoms including but not limited to new or worsening pain, inability to keep fluids down, fever, passing out, blood in your vomit or stool, or any other concerns.

## 2021-12-03 DIAGNOSIS — K3184 Gastroparesis: Secondary | ICD-10-CM | POA: Diagnosis not present

## 2021-12-03 DIAGNOSIS — R112 Nausea with vomiting, unspecified: Secondary | ICD-10-CM | POA: Diagnosis not present

## 2021-12-03 DIAGNOSIS — D6859 Other primary thrombophilia: Secondary | ICD-10-CM | POA: Diagnosis present

## 2021-12-03 DIAGNOSIS — Z886 Allergy status to analgesic agent status: Secondary | ICD-10-CM | POA: Diagnosis not present

## 2021-12-03 DIAGNOSIS — E1143 Type 2 diabetes mellitus with diabetic autonomic (poly)neuropathy: Secondary | ICD-10-CM | POA: Diagnosis not present

## 2021-12-03 DIAGNOSIS — Z8249 Family history of ischemic heart disease and other diseases of the circulatory system: Secondary | ICD-10-CM | POA: Diagnosis not present

## 2021-12-03 DIAGNOSIS — F17210 Nicotine dependence, cigarettes, uncomplicated: Secondary | ICD-10-CM | POA: Diagnosis present

## 2021-12-03 DIAGNOSIS — Z794 Long term (current) use of insulin: Secondary | ICD-10-CM | POA: Diagnosis not present

## 2021-12-03 DIAGNOSIS — Z79899 Other long term (current) drug therapy: Secondary | ICD-10-CM | POA: Diagnosis not present

## 2021-12-03 DIAGNOSIS — Z9641 Presence of insulin pump (external) (internal): Secondary | ICD-10-CM | POA: Diagnosis present

## 2021-12-03 DIAGNOSIS — K219 Gastro-esophageal reflux disease without esophagitis: Secondary | ICD-10-CM | POA: Diagnosis present

## 2021-12-03 DIAGNOSIS — I10 Essential (primary) hypertension: Secondary | ICD-10-CM | POA: Diagnosis present

## 2021-12-03 DIAGNOSIS — E559 Vitamin D deficiency, unspecified: Secondary | ICD-10-CM | POA: Diagnosis present

## 2021-12-03 DIAGNOSIS — E0590 Thyrotoxicosis, unspecified without thyrotoxic crisis or storm: Secondary | ICD-10-CM | POA: Diagnosis present

## 2021-12-03 DIAGNOSIS — E1065 Type 1 diabetes mellitus with hyperglycemia: Secondary | ICD-10-CM | POA: Diagnosis present

## 2021-12-03 DIAGNOSIS — J45909 Unspecified asthma, uncomplicated: Secondary | ICD-10-CM | POA: Diagnosis present

## 2021-12-03 DIAGNOSIS — E1043 Type 1 diabetes mellitus with diabetic autonomic (poly)neuropathy: Secondary | ICD-10-CM | POA: Diagnosis present

## 2021-12-03 DIAGNOSIS — K5900 Constipation, unspecified: Secondary | ICD-10-CM | POA: Diagnosis present

## 2021-12-03 LAB — GLUCOSE, CAPILLARY
Glucose-Capillary: 108 mg/dL — ABNORMAL HIGH (ref 70–99)
Glucose-Capillary: 123 mg/dL — ABNORMAL HIGH (ref 70–99)
Glucose-Capillary: 125 mg/dL — ABNORMAL HIGH (ref 70–99)
Glucose-Capillary: 136 mg/dL — ABNORMAL HIGH (ref 70–99)
Glucose-Capillary: 154 mg/dL — ABNORMAL HIGH (ref 70–99)

## 2021-12-03 LAB — COMPREHENSIVE METABOLIC PANEL
ALT: 7 U/L (ref 0–44)
AST: 11 U/L — ABNORMAL LOW (ref 15–41)
Albumin: 3.3 g/dL — ABNORMAL LOW (ref 3.5–5.0)
Alkaline Phosphatase: 48 U/L (ref 38–126)
Anion gap: 8 (ref 5–15)
BUN: 10 mg/dL (ref 6–20)
CO2: 22 mmol/L (ref 22–32)
Calcium: 9.3 mg/dL (ref 8.9–10.3)
Chloride: 107 mmol/L (ref 98–111)
Creatinine, Ser: 0.98 mg/dL (ref 0.44–1.00)
GFR, Estimated: >60 mL/min (ref >60)
Glucose, Bld: 130 mg/dL — ABNORMAL HIGH (ref 70–99)
Potassium: 3.7 mmol/L (ref 3.5–5.1)
Sodium: 137 mmol/L (ref 135–145)
Total Bilirubin: 0.9 mg/dL (ref 0.3–1.2)
Total Protein: 6 g/dL — ABNORMAL LOW (ref 6.5–8.1)

## 2021-12-03 LAB — CBC
HCT: 33.5 % — ABNORMAL LOW (ref 36.0–46.0)
Hemoglobin: 11.2 g/dL — ABNORMAL LOW (ref 12.0–15.0)
MCH: 29.8 pg (ref 26.0–34.0)
MCHC: 33.4 g/dL (ref 30.0–36.0)
MCV: 89.1 fL (ref 80.0–100.0)
Platelets: 241 10*3/uL (ref 150–400)
RBC: 3.76 MIL/uL — ABNORMAL LOW (ref 3.87–5.11)
RDW: 13 % (ref 11.5–15.5)
WBC: 6.8 10*3/uL (ref 4.0–10.5)
nRBC: 0 % (ref 0.0–0.2)

## 2021-12-03 MED ORDER — PAROXETINE HCL 20 MG PO TABS
20.0000 mg | ORAL_TABLET | Freq: Every day | ORAL | Status: DC
  Administered 2021-12-03 – 2021-12-04 (×2): 20 mg via ORAL
  Filled 2021-12-03 (×2): qty 1

## 2021-12-03 MED ORDER — METOCLOPRAMIDE HCL 5 MG PO TABS
10.0000 mg | ORAL_TABLET | Freq: Three times a day (TID) | ORAL | Status: DC
  Administered 2021-12-03: 10 mg via ORAL
  Filled 2021-12-03: qty 2

## 2021-12-03 MED ORDER — AMLODIPINE BESYLATE 10 MG PO TABS
10.0000 mg | ORAL_TABLET | Freq: Every day | ORAL | Status: DC
Start: 2021-12-03 — End: 2021-12-04
  Administered 2021-12-03 – 2021-12-04 (×2): 10 mg via ORAL
  Filled 2021-12-03 (×2): qty 1

## 2021-12-03 MED ORDER — PROCHLORPERAZINE EDISYLATE 10 MG/2ML IJ SOLN
10.0000 mg | Freq: Four times a day (QID) | INTRAMUSCULAR | Status: DC | PRN
  Administered 2021-12-03: 10 mg via INTRAVENOUS
  Filled 2021-12-03: qty 2

## 2021-12-03 MED ORDER — METOCLOPRAMIDE HCL 5 MG/ML IJ SOLN
10.0000 mg | Freq: Three times a day (TID) | INTRAMUSCULAR | Status: DC
  Administered 2021-12-03 – 2021-12-04 (×3): 10 mg via INTRAVENOUS
  Filled 2021-12-03 (×3): qty 2

## 2021-12-03 MED ORDER — HYDROMORPHONE HCL 1 MG/ML IJ SOLN
0.5000 mg | INTRAMUSCULAR | Status: DC | PRN
  Administered 2021-12-03: 0.5 mg via INTRAVENOUS
  Filled 2021-12-03: qty 0.5

## 2021-12-03 NOTE — Progress Notes (Signed)
Patient has had profuse vomiting this afternoon with abdominal discomfort and pain. MD made aware.

## 2021-12-03 NOTE — Progress Notes (Signed)
PROGRESS NOTE  [name] Name [id number]:[id number] DOB: [birth year] DOA: 12/02/2021 PCP: Medicine, [facility]   LOS: 0 days   Brief Narrative / Interim history: 31 y.o. female with medical history significant of asthma, type 1 diabetes, DKA episodes, hypertension, history of hypertensive urgency, GERD, cannabinoid hyperemesis syndrome, constipation, subclinical hyperthyroidism, primary hypercoagulable state, tobacco use disorder, vitamin D deficiency who is coming to the emergency department due to abdominal pain, nausea, multiple episodes of emesis since Thursday.   Subjective / 24h Interval events: Feeling better this morning, wants to eat, she is hungry.  No vomiting overnight  Assesement and Plan: Principal Problem:   Diabetic gastroparesis (HCC) Active Problems:   Uncontrolled type 1 diabetes mellitus with hyperglycemia (HCC)   Essential hypertension, benign   GERD (gastroesophageal reflux disease)   Abdominal pain, epigastric   Nausea and vomiting   Principal problem Epigastric abdominal pain, intractable nausea & vomiting, diabetic gastroparesis-patient improved overnight with antiemetics, Reglan, and this morning wanted to eat.  She was started on a clear liquid diet, tolerated and was advanced to soft, however could not tolerate with that and started having recurrent symptoms.  Downgrade diet to full liquid, continue to monitor support with antiemetics.  Continue fluids  Active problems Type 1 diabetes mellitus, with hyperglycemia-continue insulin pump  Essential hypertension-continue amlodipine, hold lisinopril  GERD-continue PPI  Scheduled Meds:  amLODipine  10 mg Oral Daily   insulin pump   Subcutaneous Q4H   metoCLOPramide (REGLAN) injection  10 mg Intravenous Q8H   pantoprazole (PROTONIX) IV  40 mg Intravenous Q24H   PARoxetine  20 mg Oral Daily   pneumococcal 20-valent conjugate vaccine  0.5 mL Intramuscular Tomorrow-1000   Continuous Infusions:   lactated ringers 125 mL/hr at 12/02/21 2349   PRN Meds:.acetaminophen **OR** acetaminophen, bisacodyl, hydrALAZINE, ondansetron **OR** ondansetron (ZOFRAN) IV, prochlorperazine  Diet Orders (From admission, onward)     Start     Ordered   12/03/21 1345  Diet clear liquid Room service appropriate? Yes; Fluid consistency: Thin  Diet effective now       Question Answer Comment  Room service appropriate? Yes   Fluid consistency: Thin      12/03/21 1344            DVT prophylaxis: SCDs Start: 12/02/21 1331   Lab Results  Component Value Date   PLT 241 12/03/2021      Code Status: Full Code  Family Communication: no family at bedside   Status is: Observation The patient will require care spanning > 2 midnights and should be moved to inpatient because: Persistent symptoms   Level of care: Med-Surg   Objective: Vitals:   12/02/21 1956 12/03/21 0001 12/03/21 0427 12/03/21 0532  BP: 125/75 127/71 (!) 156/101 (!) 141/82  Pulse: 91 89 88   Resp: 18 18 18    Temp: 97.8 F (36.6 C) 97.9 F (36.6 C) 98.4 F (36.9 C)   TempSrc: Oral Oral Oral   SpO2: 100% 100% 100%   Weight:      Height:        Intake/Output Summary (Last 24 hours) at 12/03/2021 1429 Last data filed at 12/03/2021 1215 Gross per 24 hour  Intake 3597.98 ml  Output 250 ml  Net 3347.98 ml   Wt Readings from Last 3 Encounters:  12/02/21 55.6 kg  10/26/21 55.1 kg  09/09/21 55.1 kg    Examination:  Constitutional: NAD Eyes: no scleral icterus ENMT: Mucous membranes are moist.  Neck: normal, supple Respiratory: clear to  auscultation bilaterally, no wheezing, no crackles.  Cardiovascular: Regular rate and rhythm, no murmurs / rubs / gallops. Abdomen: non distended, no tenderness. Bowel sounds positive.  Musculoskeletal: no clubbing / cyanosis.    Data Reviewed: I have independently reviewed following labs and imaging studies   CBC Recent Labs  Lab 12/01/21 1814 12/02/21 1143 12/03/21 0528   WBC 8.1 6.5 6.8  HGB 13.7 13.1 11.2*  HCT 41.2 39.3 33.5*  PLT 309 311 241  MCV 89.8 90.1 89.1  MCH 29.8 30.0 29.8  MCHC 33.3 33.3 33.4  RDW 13.3 13.1 13.0  LYMPHSABS 2.9 1.5  --   MONOABS 0.5 0.3  --   EOSABS 0.1 0.0  --   BASOSABS 0.1 0.0  --     Recent Labs  Lab 12/01/21 1814 12/02/21 1143 12/03/21 0528  NA 139 137 137  K 3.9 3.9 3.7  CL 107 107 107  CO2 22 20* 22  GLUCOSE 153* 165* 130*  BUN 16 11 10   CREATININE 1.43* 1.05* 0.98  CALCIUM 10.1 9.6 9.3  AST 13* 12* 11*  ALT 9 9 7   ALKPHOS 61 58 48  BILITOT 0.7 0.9 0.9  ALBUMIN 4.2 4.1 3.3*    ------------------------------------------------------------------------------------------------------------------ No results for input(s): "CHOL", "HDL", "LDLCALC", "TRIG", "CHOLHDL", "LDLDIRECT" in the last 72 hours.  Lab Results  Component Value Date   HGBA1C 7.9 (H) 08/05/2021   ------------------------------------------------------------------------------------------------------------------ No results for input(s): "TSH", "T4TOTAL", "T3FREE", "THYROIDAB" in the last 72 hours.  Invalid input(s): "FREET3"  Cardiac Enzymes No results for input(s): "CKMB", "TROPONINI", "MYOGLOBIN" in the last 168 hours.  Invalid input(s): "CK" ------------------------------------------------------------------------------------------------------------------ No results found for: "BNP"  CBG: Recent Labs  Lab 12/02/21 1957 12/03/21 0002 12/03/21 0428 12/03/21 0726 12/03/21 1120  GLUCAP 142* 123* 108* 154* 136*    No results found for this or any previous visit (from the past 240 hour(s)).   Radiology Studies: No results found.   12/05/21, MD, PhD [facility]  Between 7 am - 7 pm I am available, please contact me via Amion (for emergencies) or Securechat (non urgent messages)  Between 7 pm - 7 am I am not available, please contact night coverage MD/APP via Amion

## 2021-12-03 NOTE — Progress Notes (Signed)
Verbal order: dilaudid 0.5 mg intravenous q4 prn for moderate to severe pain. MD: Dr. Wendy Poet.

## 2021-12-03 NOTE — Progress Notes (Signed)
Patient not tolerating soft diet. Patient requested zofran 4 mg IV for nausea and stomach discomfort.

## 2021-12-04 DIAGNOSIS — E1143 Type 2 diabetes mellitus with diabetic autonomic (poly)neuropathy: Secondary | ICD-10-CM | POA: Diagnosis not present

## 2021-12-04 DIAGNOSIS — K3184 Gastroparesis: Secondary | ICD-10-CM | POA: Diagnosis not present

## 2021-12-04 LAB — GLUCOSE, CAPILLARY
Glucose-Capillary: 117 mg/dL — ABNORMAL HIGH (ref 70–99)
Glucose-Capillary: 134 mg/dL — ABNORMAL HIGH (ref 70–99)

## 2021-12-04 MED ORDER — METOCLOPRAMIDE HCL 5 MG PO TABS: 5.0000 mg | ORAL_TABLET | Freq: Three times a day (TID) | ORAL | 0 refills | Status: DC | PRN

## 2021-12-04 MED ORDER — ONDANSETRON 4 MG PO TBDP: 4.0000 mg | ORAL_TABLET | Freq: Two times a day (BID) | ORAL | 0 refills | Status: DC | PRN

## 2021-12-04 NOTE — Discharge Summary (Signed)
Physician Discharge Summary  [name] GXQ:[id number] DOB: 1990-09-19 DOA: 12/02/2021  PCP: [facility]  Admit date: 12/02/2021 Discharge date: 12/04/2021  Admitted From: home Disposition:  home  Recommendations for Outpatient Follow-up:  Follow up with PCP in 1-2 weeks  Home Health: none Equipment/Devices: none  Discharge Condition: stable CODE STATUS: Full code   HPI: Per admitting MD, [name] is a 31 y.o. female with medical history significant of asthma, type 1 diabetes, DKA episodes, hypertension, history of hypertensive urgency, GERD, cannabinoid hyperemesis syndrome, constipation, subclinical hyperthyroidism, primary hypercoagulable state, tobacco use disorder, vitamin D deficiency who is coming to the emergency department due to abdominal pain, nausea, multiple episodes of emesis since Thursday.  She has also been constipated since Monday.   No diarrhea, melena or hematochezia.  No flank pain, dysuria, frequency or hematuria.  She denied fever, chills, rhinorrhea, sore throat, wheezing or hemoptysis.  No chest pain, palpitations, diaphoresis, PND, orthopnea or pitting edema of the lower extremities. No polyuria, polydipsia, polyphagia or blurred vision.   Hospital Course / Discharge diagnoses: Principal Problem:   Diabetic gastroparesis (HCC) Active Problems:   Uncontrolled type 1 diabetes mellitus with hyperglycemia (HCC)   Essential hypertension, benign   GERD (gastroesophageal reflux disease)   Abdominal pain, epigastric   Nausea and vomiting   Intractable nausea and vomiting   Principal problem Epigastric abdominal pain, intractable nausea & vomiting, diabetic gastroparesis-she was admitted to the hospital with intractable nausea and vomiting, epigastric abdominal pain.  She was treated conservatively with IV fluids, antiemetics and pain medications with improvement.  Her diet was slowly advanced, and eventually tolerating a  regular diet.  With improvement she will be discharged home in stable condition  Active problems Type 1 diabetes mellitus, with hyperglycemia-continue insulin pump Essential hypertension-continue home regimen GERD-continue PPI Prior marijuana use-quit about a month ago  Sepsis ruled out   Discharge Instructions   Allergies as of 12/04/2021       Reactions   Aleve [naproxen Sodium] Swelling        Medication List     STOP taking these medications    insulin glargine 100 UNIT/ML Solostar Pen Commonly known as: LANTUS       TAKE these medications    amLODipine 5 MG tablet Commonly known as: NORVASC Take 5 mg by mouth daily.   etonogestrel 68 MG Impl implant Commonly known as: NEXPLANON 1 each by Subdermal route once. Implanted summer 2020   fluticasone 50 MCG/ACT nasal spray Commonly known as: FLONASE Place 2 sprays into both nostrils daily.   FreeStyle Libre 14 Day Sensor Misc by Other route every fourteen (14) days. One libre sensor every 14 days   lisinopril 40 MG tablet Commonly known as: ZESTRIL Take 40 mg by mouth every morning.   metoCLOPramide 5 MG tablet Commonly known as: REGLAN Take 1 tablet (5 mg total) by mouth every 8 (eight) hours as needed for nausea. What changed:  when to take this reasons to take this   NOVOLOG IJ Inject as directed. Inject before every meal and when blood sugar spikes per insulin pump. What changed: Another medication with the same name was removed. Continue taking this medication, and follow the directions you see here.   ondansetron 4 MG disintegrating tablet Commonly known as: ZOFRAN-ODT Take 1 tablet (4 mg total) by mouth 2 (two) times daily as needed for nausea or vomiting.   pantoprazole 40 MG tablet Commonly known as: PROTONIX Take 1 tablet (40 mg  total) by mouth daily as needed (heart burn). What changed: when to take this   PARoxetine 20 MG tablet Commonly known as: PAXIL Take 20 mg by mouth daily.    polyethylene glycol powder 17 GM/SCOOP powder Commonly known as: GLYCOLAX/MIRALAX Take 17 g by mouth daily.   ProAir RespiClick 108 (90 Base) MCG/ACT Aepb Generic drug: Albuterol Sulfate Inhale 1-2 puffs into the lungs every 6 (six) hours as needed. What changed:  how much to take when to take this reasons to take this   sucralfate 1 g tablet Commonly known as: Carafate Take 1 tablet (1 g total) by mouth 4 (four) times daily -  with meals and at bedtime for 5 days. What changed:  when to take this reasons to take this   ULTRA-THIN II SHORT PEN NEEDLE 31G X 8 MM Misc Generic drug: Insulin Pen Needle Inject into the skin.        Follow-up Information     [facility] Follow up in 1 week(s).   Specialty: Family Medicine Contact information: [address] [place] [address] [phone]                 Consultations: None  Procedures/Studies:  No results found.   Subjective: - no chest pain, shortness of breath, no abdominal pain, nausea or vomiting.   Discharge Exam: BP (!) 171/97 (BP Location: Left Arm)   Pulse (!) 101   Temp 97.7 F (36.5 C) (Oral)   Resp 18   Ht 5\' 4"  (1.626 m)   Wt 55.6 kg   SpO2 100%   BMI 21.04 kg/m   General: Pt is alert, awake, not in acute distress Cardiovascular: RRR, S1/S2 +, no rubs, no gallops Respiratory: CTA bilaterally, no wheezing, no rhonchi  The results of significant diagnostics from this hospitalization (including imaging, microbiology, ancillary and laboratory) are listed below for reference.     Microbiology: No results found for this or any previous visit (from the past 240 hour(s)).   Labs: Basic Metabolic Panel: Recent Labs  Lab 12/01/21 1814 12/02/21 1143 12/03/21 0528  NA 139 137 137  K 3.9 3.9 3.7  CL 107 107 107  CO2 22 20* 22  GLUCOSE 153* 165* 130*  BUN 16 11 10   CREATININE 1.43* 1.05* 0.98  CALCIUM 10.1 9.6 9.3   Liver Function Tests: Recent Labs   Lab 12/01/21 1814 12/02/21 1143 12/03/21 0528  AST 13* 12* 11*  ALT 9 9 7   ALKPHOS 61 58 48  BILITOT 0.7 0.9 0.9  PROT 7.6 7.5 6.0*  ALBUMIN 4.2 4.1 3.3*   CBC: Recent Labs  Lab 12/01/21 1814 12/02/21 1143 12/03/21 0528  WBC 8.1 6.5 6.8  NEUTROABS 4.5 4.6  --   HGB 13.7 13.1 11.2*  HCT 41.2 39.3 33.5*  MCV 89.8 90.1 89.1  PLT 309 311 241   CBG: Recent Labs  Lab 12/03/21 0726 12/03/21 1120 12/03/21 1540 12/04/21 0003 12/04/21 0405  GLUCAP 154* 136* 125* 117* 134*   Hgb A1c No results for input(s): "HGBA1C" in the last 72 hours. Lipid Profile No results for input(s): "CHOL", "HDL", "LDLCALC", "TRIG", "CHOLHDL", "LDLDIRECT" in the last 72 hours. Thyroid function studies No results for input(s): "TSH", "T4TOTAL", "T3FREE", "THYROIDAB" in the last 72 hours.  Invalid input(s): "FREET3" Urinalysis    Component Value Date/Time   COLORURINE YELLOW 12/02/2021 0410   APPEARANCEUR HAZY (A) 12/02/2021 0410   LABSPEC 1.015 12/02/2021 0410   PHURINE 5.0 12/02/2021 0410  GLUCOSEU NEGATIVE 12/02/2021 0410   HGBUR LARGE (A) 12/02/2021 0410   BILIRUBINUR NEGATIVE 12/02/2021 0410   BILIRUBINUR Negative 11/12/2012 1238   KETONESUR 5 (A) 12/02/2021 0410   PROTEINUR 100 (A) 12/02/2021 0410   UROBILINOGEN 0.2 06/05/2017 1111   NITRITE NEGATIVE 12/02/2021 0410   LEUKOCYTESUR NEGATIVE 12/02/2021 0410    FURTHER DISCHARGE INSTRUCTIONS:   Get Medicines reviewed and adjusted: Please take all your medications with you for your next visit with your Primary MD   Laboratory/radiological data: Please request your Primary MD to go over all hospital tests and procedure/radiological results at the follow up, please ask your Primary MD to get all Hospital records sent to his/her office.   In some cases, they will be blood work, cultures and biopsy results pending at the time of your discharge. Please request that your primary care M.D. goes through all the records of your hospital  data and follows up on these results.   Also Note the following: If you experience worsening of your admission symptoms, develop shortness of breath, life threatening emergency, suicidal or homicidal thoughts you must seek medical attention immediately by calling 911 or calling your MD immediately  if symptoms less severe.   You must read complete instructions/literature along with all the possible adverse reactions/side effects for all the Medicines you take and that have been prescribed to you. Take any new Medicines after you have completely understood and accpet all the possible adverse reactions/side effects.    Do not drive when taking Pain medications or sleeping medications (Benzodaizepines)   Do not take more than prescribed Pain, Sleep and Anxiety Medications. It is not advisable to combine anxiety,sleep and pain medications without talking with your primary care practitioner   Special Instructions: If you have smoked or chewed Tobacco  in the last 2 yrs please stop smoking, stop any regular Alcohol  and or any Recreational drug use.   Wear Seat belts while driving.   Please note: You were cared for by a hospitalist during your hospital stay. Once you are discharged, your primary care physician will handle any further medical issues. Please note that NO REFILLS for any discharge medications will be authorized once you are discharged, as it is imperative that you return to your primary care physician (or establish a relationship with a primary care physician if you do not have one) for your post hospital discharge needs so that they can reassess your need for medications and monitor your lab values.  Time coordinating discharge: 35 minutes  SIGNED:  Pamella Pert, MD, PhD 12/04/2021, 9:42 AM

## 2021-12-04 NOTE — Progress Notes (Signed)
PIV removed. Discharge instructions completed. Patient verbalized understanding of medication regimen, follow up appointments and discharge instructions. Patient belongings gathered and packed to discharge.  

## 2021-12-05 ENCOUNTER — Emergency Department (HOSPITAL_COMMUNITY)
Admission: EM | Admit: 2021-12-05 | Discharge: 2021-12-05 | Disposition: A | Discharge status: Home / Self Care | Payer: Medicaid Other | Attending: Emergency Medicine | Admitting: Emergency Medicine

## 2021-12-05 ENCOUNTER — Encounter (HOSPITAL_COMMUNITY): Payer: Self-pay

## 2021-12-05 ENCOUNTER — Other Ambulatory Visit: Payer: Self-pay

## 2021-12-05 DIAGNOSIS — E109 Type 1 diabetes mellitus without complications: Secondary | ICD-10-CM | POA: Insufficient documentation

## 2021-12-05 DIAGNOSIS — Z79899 Other long term (current) drug therapy: Secondary | ICD-10-CM | POA: Diagnosis not present

## 2021-12-05 DIAGNOSIS — R1010 Upper abdominal pain, unspecified: Secondary | ICD-10-CM | POA: Diagnosis not present

## 2021-12-05 DIAGNOSIS — I10 Essential (primary) hypertension: Secondary | ICD-10-CM | POA: Insufficient documentation

## 2021-12-05 DIAGNOSIS — R112 Nausea with vomiting, unspecified: Secondary | ICD-10-CM | POA: Diagnosis not present

## 2021-12-05 DIAGNOSIS — Z794 Long term (current) use of insulin: Secondary | ICD-10-CM | POA: Diagnosis not present

## 2021-12-05 DIAGNOSIS — R000 Tachycardia, unspecified: Secondary | ICD-10-CM | POA: Diagnosis not present

## 2021-12-05 LAB — I-STAT BETA HCG BLOOD, ED (MC, WL, AP ONLY): I-stat hCG, quantitative: <5 m[IU]/mL (ref <5)

## 2021-12-05 LAB — COMPREHENSIVE METABOLIC PANEL
ALT: 9 U/L (ref 0–44)
AST: 18 U/L (ref 15–41)
Albumin: 4.2 g/dL (ref 3.5–5.0)
Alkaline Phosphatase: 59 U/L (ref 38–126)
Anion gap: 13 (ref 5–15)
BUN: 13 mg/dL (ref 6–20)
CO2: 21 mmol/L — ABNORMAL LOW (ref 22–32)
Calcium: 10 mg/dL (ref 8.9–10.3)
Chloride: 104 mmol/L (ref 98–111)
Creatinine, Ser: 1.2 mg/dL — ABNORMAL HIGH (ref 0.44–1.00)
GFR, Estimated: >60 mL/min (ref >60)
Glucose, Bld: 191 mg/dL — ABNORMAL HIGH (ref 70–99)
Potassium: 3.5 mmol/L (ref 3.5–5.1)
Sodium: 138 mmol/L (ref 135–145)
Total Bilirubin: 1.4 mg/dL — ABNORMAL HIGH (ref 0.3–1.2)
Total Protein: 7.3 g/dL (ref 6.5–8.1)

## 2021-12-05 LAB — CBC WITH DIFFERENTIAL/PLATELET
Abs Immature Granulocytes: 0.03 10*3/uL (ref 0.00–0.07)
Basophils Absolute: 0.1 10*3/uL (ref 0.0–0.1)
Basophils Relative: 1 %
Eosinophils Absolute: 0 10*3/uL (ref 0.0–0.5)
Eosinophils Relative: 0 %
HCT: 36.9 % (ref 36.0–46.0)
Hemoglobin: 12.9 g/dL (ref 12.0–15.0)
Immature Granulocytes: 0 %
Lymphocytes Relative: 15 %
Lymphs Abs: 1.4 10*3/uL (ref 0.7–4.0)
MCH: 30.1 pg (ref 26.0–34.0)
MCHC: 35 g/dL (ref 30.0–36.0)
MCV: 86.2 fL (ref 80.0–100.0)
Monocytes Absolute: 0.6 10*3/uL (ref 0.1–1.0)
Monocytes Relative: 6 %
Neutro Abs: 7.6 10*3/uL (ref 1.7–7.7)
Neutrophils Relative %: 78 %
Platelets: 310 10*3/uL (ref 150–400)
RBC: 4.28 MIL/uL (ref 3.87–5.11)
RDW: 13 % (ref 11.5–15.5)
WBC: 9.6 10*3/uL (ref 4.0–10.5)
nRBC: 0 % (ref 0.0–0.2)

## 2021-12-05 LAB — LIPASE, BLOOD: Lipase: 22 U/L (ref 11–51)

## 2021-12-05 MED ORDER — ONDANSETRON HCL 4 MG/2ML IJ SOLN
4.0000 mg | Freq: Once | INTRAMUSCULAR | Status: AC
  Administered 2021-12-05: 4 mg via INTRAVENOUS
  Filled 2021-12-05: qty 2

## 2021-12-05 MED ORDER — PANTOPRAZOLE SODIUM 40 MG IV SOLR
40.0000 mg | Freq: Once | INTRAVENOUS | Status: AC
Start: 2021-12-05 — End: 2021-12-05
  Administered 2021-12-05: 40 mg via INTRAVENOUS
  Filled 2021-12-05: qty 10

## 2021-12-05 MED ORDER — LACTATED RINGERS IV BOLUS
1000.0000 mL | Freq: Once | INTRAVENOUS | Status: AC
  Administered 2021-12-05: 1000 mL via INTRAVENOUS

## 2021-12-05 MED ORDER — HYDROMORPHONE HCL 2 MG/ML IJ SOLN
0.5000 mg | Freq: Once | INTRAMUSCULAR | Status: AC
  Administered 2021-12-05: 0.5 mg via INTRAVENOUS
  Filled 2021-12-05: qty 1

## 2021-12-05 MED ORDER — METOCLOPRAMIDE HCL 5 MG/ML IJ SOLN
10.0000 mg | INTRAMUSCULAR | Status: AC
  Administered 2021-12-05: 10 mg via INTRAVENOUS
  Filled 2021-12-05: qty 2

## 2021-12-05 NOTE — ED Provider Notes (Signed)
[facility]-EMERGENCY DEPT Provider Note   CSN: [id number] Arrival date & time: 12/05/21  3295     History  Chief Complaint  Patient presents with  . Abdominal Pain  . Emesis    [name] is a 31 y.o. female.  31 y/o female with hx of IDDM (on insulin pump), HTN, cannabinoid hyperemesis syndrome, and GERD presents to the ED for nausea and vomiting. Discharged yesterday after admission for intractable N/V. States she went home and ate grilled chicken and her symptoms returned. She has tried Zofran for nausea/vomiting without relief. C/o associated "severe" pain in her upper abdomen. Is followed by GI at [facility].   Abdominal Pain Associated symptoms: vomiting   Emesis Associated symptoms: abdominal pain        Home Medications Prior to Admission medications   Medication Sig Start Date End Date Taking? Authorizing Provider  Albuterol Sulfate (PROAIR RESPICLICK) 108 (90 Base) MCG/ACT AEPB Inhale 1-2 puffs into the lungs every 6 (six) hours as needed. Patient taking differently: Inhale 2-3 puffs into the lungs as needed (cold symptoms). 09/22/19   Nche, Bonna Gains, NP  amLODipine (NORVASC) 5 MG tablet Take 5 mg by mouth daily. 07/13/21   [provider]  Continuous Blood Gluc Sensor (FREESTYLE LIBRE 14 DAY SENSOR) MISC by Other route every fourteen (14) days. One libre sensor every 14 days 01/21/18   [provider]  etonogestrel (NEXPLANON) 68 MG IMPL implant 1 each by Subdermal route once. Implanted summer 2020    [provider]  fluticasone (FLONASE) 50 MCG/ACT nasal spray Place 2 sprays into both nostrils daily. Patient not taking: Reported on 12/02/2021 09/22/19   Nche, Bonna Gains, NP  Insulin Aspart (NOVOLOG IJ) Inject as directed. Inject before every meal and when blood sugar spikes per insulin pump.    [provider]  Insulin Pen Needle (ULTRA-THIN II SHORT PEN NEEDLE) 31G X 8 MM MISC Inject into the skin. 01/21/18    [provider]  lisinopril (ZESTRIL) 40 MG tablet Take 40 mg by mouth every morning. 07/13/21   [provider]  metoCLOPramide (REGLAN) 5 MG tablet Take 1 tablet (5 mg total) by mouth every 8 (eight) hours as needed for nausea. 12/04/21   Leatha Gilding, MD  ondansetron (ZOFRAN-ODT) 4 MG disintegrating tablet Take 1 tablet (4 mg total) by mouth 2 (two) times daily as needed for nausea or vomiting. 12/04/21   Leatha Gilding, MD  pantoprazole (PROTONIX) 40 MG tablet Take 1 tablet (40 mg total) by mouth daily as needed (heart burn). Patient taking differently: Take 40 mg by mouth daily. 08/05/21   Rolly Salter, MD  PARoxetine (PAXIL) 20 MG tablet Take 20 mg by mouth daily. 07/13/21   [provider]  polyethylene glycol powder (GLYCOLAX/MIRALAX) 17 GM/SCOOP powder Take 17 g by mouth daily. Patient not taking: Reported on 09/04/2021 09/22/19   Nche, Bonna Gains, NP  sucralfate (CARAFATE) 1 g tablet Take 1 tablet (1 g total) by mouth 4 (four) times daily -  with meals and at bedtime for 5 days. Patient taking differently: Take 1 g by mouth daily as needed (heartburn). 09/12/21 12/02/21  Osvaldo Shipper, MD  ranitidine (ZANTAC) 75 MG tablet Take 75 mg by mouth daily as needed for heartburn.  06/15/20  [provider]      Allergies    Aleve [naproxen sodium]    Review of Systems   Review of Systems  Gastrointestinal:  Positive for abdominal pain and  vomiting.  Ten systems reviewed and are negative for acute change, except as noted in the HPI.    Physical Exam Updated Vital Signs BP (!) 189/114 (BP Location: Right Arm)   Pulse (!) 125   Temp 97.8 F (36.6 C) (Oral)   Resp 20   SpO2 96%   Physical Exam Vitals and nursing note reviewed.  Constitutional:      General: She is not in acute distress.    Appearance: She is well-developed. She is not diaphoretic.     Comments: Intermittent dry heaves  HENT:     Head: Normocephalic and atraumatic.  Eyes:      General: No scleral icterus.    Conjunctiva/sclera: Conjunctivae normal.  Cardiovascular:     Rate and Rhythm: Regular rhythm. Tachycardia present.     Pulses: Normal pulses.  Pulmonary:     Effort: Pulmonary effort is normal. No respiratory distress.     Comments: Respirations even and unlabored Abdominal:     Palpations: There is no mass.     Tenderness: Tenderness: no focal TTP. There is no guarding.     Comments: Abdomen soft, nondistended. No peritoneal signs or palpable masses.  Musculoskeletal:        General: Normal range of motion.     Cervical back: Normal range of motion.  Skin:    General: Skin is warm and dry.     Coloration: Skin is not pale.     Findings: No erythema or rash.  Neurological:     Mental Status: She is alert and oriented to person, place, and time.     Coordination: Coordination normal.  Psychiatric:        Behavior: Behavior normal.     ED Results / Procedures / Treatments   Labs (all labs ordered are listed, but only abnormal results are displayed) Labs Reviewed  CBC WITH DIFFERENTIAL/PLATELET  LIPASE, BLOOD  COMPREHENSIVE METABOLIC PANEL  I-STAT BETA HCG BLOOD, ED ([facility], [facility], [facility] ONLY)    EKG None  Radiology No results found.  Procedures Procedures    Medications Ordered in ED Medications  pantoprazole (PROTONIX) injection 40 mg (has no administration in time range)  lactated ringers bolus 1,000 mL (has no administration in time range)  metoCLOPramide (REGLAN) injection 10 mg (has no administration in time range)    ED Course/ Medical Decision Making/ A&P Clinical Course as of 12/05/21 0637  Tue Dec 05, 2021  0637 Normal QTc on EKG [KH]    Clinical Course User Index [KH] Antony Madura, PA-C                           Medical Decision Making Amount and/or Complexity of Data Reviewed Labs: ordered. ECG/medicine tests: ordered.  Risk Prescription drug management.   This patient presents to the ED for concern of N/V,  this involves an extensive number of treatment options, and is a complaint that carries with it a high risk of complications and morbidity.  The differential diagnosis includes PUD vs gastroparesis vs food-borne illness vs cannabinoid hyperemesis vs pSBO/SBO    Co morbidities that complicate the patient evaluation  IDDM   Additional history obtained:  External records from outside source obtained and reviewed including CT abdomen/pelvis from 07/2021   Cardiac Monitoring:  The patient was maintained on a cardiac monitor.  I personally viewed and interpreted the cardiac monitored which showed an underlying rhythm of: sinus tachycardia   Medicines ordered and prescription drug management:  I ordered medication including IV protonix and Reglan for nausea  I have reviewed the patients home medicines and have made adjustments as needed   Test Considered:  CT abdomen/pelvis   Dispostion:  Patient care signed out to [place], PA-C at shift change who will reassess and disposition appropriately.          Final Clinical Impression(s) / ED Diagnoses Final diagnoses:  Nausea and vomiting, unspecified vomiting type    Rx / DC Orders ED Discharge Orders     None         Antony Madura, PA-C 12/05/21 0622    Melene Plan, DO 12/05/21 [phone]

## 2021-12-05 NOTE — ED Notes (Signed)
Patient given 1 cup of water for PO challenge.

## 2021-12-05 NOTE — ED Notes (Addendum)
Previous note deleted due to wrong patient. Patient resting in stretcher waiting for pain medication.

## 2021-12-05 NOTE — ED Provider Notes (Signed)
Care of patient transferred by Antony Madura, PA-C at shift change. Please see her note and work-up initiated. Briefly, this is a 31 year old female with history of diabetes, diabetic gastroparesis, cannabinoid hyperemesis syndrome, hypertension who presents for nausea and vomiting. She was discharged yesterday from hospitalist service for intractable nausea and vomiting. States she went home, ate and began having symptoms again. She tried Zofran without relief and presents here.  Physical Exam  BP (!) 180/94   Pulse (!) 120   Temp 97.8 F (36.6 C) (Oral)   Resp (!) 25   SpO2 99%   Physical Exam Vitals and nursing note reviewed.  Constitutional:      General: She is not in acute distress.    Appearance: She is well-developed. She is ill-appearing. She is not toxic-appearing.  HENT:     Head: Normocephalic and atraumatic.  Eyes:     General: No scleral icterus. Cardiovascular:     Rate and Rhythm: Tachycardia present.     Heart sounds: No murmur heard. Pulmonary:     Effort: Pulmonary effort is normal. No respiratory distress.  Abdominal:     General: Abdomen is protuberant. Bowel sounds are decreased. There is no distension.     Palpations: Abdomen is soft.     Tenderness: There is generalized abdominal tenderness. There is no guarding or rebound.  Skin:    General: Skin is warm and dry.     Capillary Refill: Capillary refill takes less than 2 seconds.     Findings: No rash.  Neurological:     General: No focal deficit present.     Mental Status: She is alert and oriented to person, place, and time.  Psychiatric:        Mood and Affect: Mood normal.        Behavior: Behavior normal.        Thought Content: Thought content normal.        Judgment: Judgment normal.    Procedures  Procedures  ED Course / MDM   Clinical Course as of 12/05/21 0847  Tue Dec 05, 2021  0637 Normal QTc on EKG [KH]  0732 Reassessed. Continues to have nausea and abdominal pain. Quite tachycardic  still to 120s. Will repeat LR bolus and give Zofran.  [LA]  [id number] After Zofran, nausea much improved. Still having abdominal pain. Will give Dilaudid 0.5mg  IV, reassess.  [LA]    Clinical Course User Index [KH] Antony Madura, PA-C [LA] Cristopher Peru, PA-C   Medical Decision Making Amount and/or Complexity of Data Reviewed Labs: ordered. ECG/medicine tests: ordered.  Risk Prescription drug management.   Care taken over at shift change.  Prior to shift change, received 1L LR, protonix, reglan. On reassessment she is still having nausea and abdominal pain. She was given Zofran, 1L LR with improvement in her nausea and vomiting. Still having abdominal pain. She was given 0.5mg  dilaudid IV with mild improvement in her symptoms. PO trialed her which she passed. Discussed that given the chronicity of her symptoms, will likely not completely resolve her abdominal pain, but reassuring that she is able to tolerate PO. She has non-peritonitic abdomen. Low suspicion for an acute abdomen at this time.  Labs consistent with nausea and vomiting. Not pregnant. No evidence of pancreatitis. No leukocytosis or electrolyte abnormalities.  She does have history of diabetes but does not have an anion gap.  Feel that she is safe for discharge. Has established gastroenterologist.  Has home protonix, reglan, and zofran already prescribed.  Cristopher Peru, PA-C 12/05/21 4562    Margarita Grizzle, MD 12/05/21 [phone]

## 2021-12-05 NOTE — ED Triage Notes (Signed)
Patient states she was recently discharged for gastroparesis and medications she was sent home with are not helping. Complaints of upper abdominal pain, NV

## 2021-12-05 NOTE — Discharge Instructions (Addendum)
You were seen in the emergency department today for abdominal pain, nausea and vomiting. Please continue to take your home Zofran, Protonix and Reglan to help improve your symptoms. Slowly increase your diet, starting with liquids, broths, and soups to more solid meals. Please follow-up with your gastroenterologist. Please return for worsening symptoms and inability to keep liquids down.

## 2021-12-10 ENCOUNTER — Emergency Department (HOSPITAL_COMMUNITY)
Admission: EM | Admit: 2021-12-10 | Discharge: 2021-12-11 | Disposition: A | Discharge status: Home / Self Care | Payer: Medicaid Other | Attending: Emergency Medicine | Admitting: Emergency Medicine

## 2021-12-10 ENCOUNTER — Other Ambulatory Visit: Payer: Self-pay

## 2021-12-10 ENCOUNTER — Encounter (HOSPITAL_COMMUNITY): Payer: Self-pay

## 2021-12-10 DIAGNOSIS — Z7984 Long term (current) use of oral hypoglycemic drugs: Secondary | ICD-10-CM | POA: Diagnosis not present

## 2021-12-10 DIAGNOSIS — F17210 Nicotine dependence, cigarettes, uncomplicated: Secondary | ICD-10-CM | POA: Diagnosis not present

## 2021-12-10 DIAGNOSIS — R112 Nausea with vomiting, unspecified: Secondary | ICD-10-CM | POA: Diagnosis present

## 2021-12-10 DIAGNOSIS — J45909 Unspecified asthma, uncomplicated: Secondary | ICD-10-CM | POA: Diagnosis not present

## 2021-12-10 DIAGNOSIS — E11649 Type 2 diabetes mellitus with hypoglycemia without coma: Secondary | ICD-10-CM | POA: Diagnosis not present

## 2021-12-10 DIAGNOSIS — Z79899 Other long term (current) drug therapy: Secondary | ICD-10-CM | POA: Insufficient documentation

## 2021-12-10 DIAGNOSIS — I10 Essential (primary) hypertension: Secondary | ICD-10-CM | POA: Insufficient documentation

## 2021-12-10 DIAGNOSIS — E1143 Type 2 diabetes mellitus with diabetic autonomic (poly)neuropathy: Secondary | ICD-10-CM | POA: Insufficient documentation

## 2021-12-10 DIAGNOSIS — K3184 Gastroparesis: Secondary | ICD-10-CM | POA: Diagnosis not present

## 2021-12-10 DIAGNOSIS — Z794 Long term (current) use of insulin: Secondary | ICD-10-CM | POA: Diagnosis not present

## 2021-12-10 DIAGNOSIS — E162 Hypoglycemia, unspecified: Secondary | ICD-10-CM | POA: Diagnosis not present

## 2021-12-10 HISTORY — DX: Type 2 diabetes mellitus with diabetic autonomic (poly)neuropathy: K31.84

## 2021-12-10 HISTORY — DX: Type 2 diabetes mellitus with diabetic autonomic (poly)neuropathy: E11.43

## 2021-12-10 LAB — CBG MONITORING, ED: Glucose-Capillary: 168 mg/dL — ABNORMAL HIGH (ref 70–99)

## 2021-12-10 LAB — URINALYSIS, ROUTINE W REFLEX MICROSCOPIC
Bilirubin Urine: NEGATIVE
Glucose, UA: NEGATIVE mg/dL
Hgb urine dipstick: NEGATIVE
Ketones, ur: 5 mg/dL — AB
Nitrite: NEGATIVE
Protein, ur: 100 mg/dL — AB
Specific Gravity, Urine: 1.017 (ref 1.005–1.030)
Squamous Epithelial / HPF: >50 — ABNORMAL HIGH (ref 0–5)
pH: 5 (ref 5.0–8.0)

## 2021-12-10 LAB — CBC WITH DIFFERENTIAL/PLATELET
Abs Immature Granulocytes: 0.02 10*3/uL (ref 0.00–0.07)
Basophils Absolute: 0.1 10*3/uL (ref 0.0–0.1)
Basophils Relative: 1 %
Eosinophils Absolute: 0.1 10*3/uL (ref 0.0–0.5)
Eosinophils Relative: 1 %
HCT: 39.5 % (ref 36.0–46.0)
Hemoglobin: 13.2 g/dL (ref 12.0–15.0)
Immature Granulocytes: 0 %
Lymphocytes Relative: 45 %
Lymphs Abs: 3.4 10*3/uL (ref 0.7–4.0)
MCH: 29.9 pg (ref 26.0–34.0)
MCHC: 33.4 g/dL (ref 30.0–36.0)
MCV: 89.4 fL (ref 80.0–100.0)
Monocytes Absolute: 0.4 10*3/uL (ref 0.1–1.0)
Monocytes Relative: 6 %
Neutro Abs: 3.6 10*3/uL (ref 1.7–7.7)
Neutrophils Relative %: 47 %
Platelets: 297 10*3/uL (ref 150–400)
RBC: 4.42 MIL/uL (ref 3.87–5.11)
RDW: 13.2 % (ref 11.5–15.5)
WBC: 7.6 10*3/uL (ref 4.0–10.5)
nRBC: 0 % (ref 0.0–0.2)

## 2021-12-10 LAB — COMPREHENSIVE METABOLIC PANEL
ALT: 8 U/L (ref 0–44)
AST: 13 U/L — ABNORMAL LOW (ref 15–41)
Albumin: 4 g/dL (ref 3.5–5.0)
Alkaline Phosphatase: 53 U/L (ref 38–126)
Anion gap: 11 (ref 5–15)
BUN: 20 mg/dL (ref 6–20)
CO2: 24 mmol/L (ref 22–32)
Calcium: 9.9 mg/dL (ref 8.9–10.3)
Chloride: 105 mmol/L (ref 98–111)
Creatinine, Ser: 1.71 mg/dL — ABNORMAL HIGH (ref 0.44–1.00)
GFR, Estimated: 41 mL/min — ABNORMAL LOW (ref >60)
Glucose, Bld: 146 mg/dL — ABNORMAL HIGH (ref 70–99)
Potassium: 3.4 mmol/L — ABNORMAL LOW (ref 3.5–5.1)
Sodium: 140 mmol/L (ref 135–145)
Total Bilirubin: 0.7 mg/dL (ref 0.3–1.2)
Total Protein: 7.2 g/dL (ref 6.5–8.1)

## 2021-12-10 LAB — BLOOD GAS, VENOUS
Acid-Base Excess: 3.8 mmol/L — ABNORMAL HIGH (ref 0.0–2.0)
Bicarbonate: 29.2 mmol/L — ABNORMAL HIGH (ref 20.0–28.0)
O2 Saturation: 46.3 %
Patient temperature: 37
pCO2, Ven: 46 mmHg (ref 44–60)
pH, Ven: 7.41 (ref 7.25–7.43)
pO2, Ven: 31 mmHg — CL (ref 32–45)

## 2021-12-10 LAB — I-STAT BETA HCG BLOOD, ED (MC, WL, AP ONLY): I-stat hCG, quantitative: <5 m[IU]/mL (ref <5)

## 2021-12-10 LAB — LIPASE, BLOOD: Lipase: 21 U/L (ref 11–51)

## 2021-12-10 MED ORDER — LACTATED RINGERS IV BOLUS
1000.0000 mL | Freq: Once | INTRAVENOUS | Status: AC
Start: 2021-12-10 — End: 2021-12-11
  Administered 2021-12-10: 1000 mL via INTRAVENOUS

## 2021-12-10 MED ORDER — LACTATED RINGERS IV BOLUS
1000.0000 mL | Freq: Once | INTRAVENOUS | Status: AC
  Administered 2021-12-10: 1000 mL via INTRAVENOUS

## 2021-12-10 MED ORDER — ONDANSETRON HCL 4 MG/2ML IJ SOLN
4.0000 mg | Freq: Once | INTRAMUSCULAR | Status: AC
Start: 2021-12-10 — End: 2021-12-10
  Administered 2021-12-10: 4 mg via INTRAVENOUS
  Filled 2021-12-10: qty 2

## 2021-12-10 NOTE — ED Provider Notes (Signed)
[facility]-EMERGENCY DEPT Provider Note: Lowella Dell, MD, FACEP  CSN: [id number] MRN: [id number] ARRIVAL: 12/10/21 at 1926 ROOM: WA18/WA18   CHIEF COMPLAINT  Vomiting   HISTORY OF PRESENT ILLNESS  12/10/21 11:28 PM [name] is a 31 y.o. female with type 1 diabetes and gastroparesis.  She had nausea and vomiting with abdominal pain yesterday consistent with an episode of gastroparesis.  The symptoms resolved but this morning she awakened with lightheadedness, blurred vision and a sense that she was dehydrated.  Her urine was dark as well.  She came in for suspected dehydration.  She denies any abdominal pain or nausea at the present time.  She states her sugars have been well controlled today.  She was given 1 L of normal saline prior to my evaluation.   Past Medical History:  Diagnosis Date   Asthma    Diabetes mellitus    type 1    Diabetic gastroparesis (HCC)    Essential hypertension, benign 02/10/2016    History reviewed. No pertinent surgical history.  Family History  Problem Relation Age of Onset   Hypertension Paternal Grandmother     Social History   Tobacco Use   Smoking status: Some Days    Types: Cigarettes   Smokeless tobacco: Current  Vaping Use   Vaping Use: Never used  Substance Use Topics   Alcohol use: No   Drug use: Yes    Types: Marijuana    Prior to Admission medications   Medication Sig Start Date End Date Taking? Authorizing Provider  Albuterol Sulfate (PROAIR RESPICLICK) 108 (90 Base) MCG/ACT AEPB Inhale 1-2 puffs into the lungs every 6 (six) hours as needed. Patient taking differently: Inhale 2-3 puffs into the lungs as needed (cold symptoms). 09/22/19  Yes Nche, Bonna Gains, NP  amLODipine (NORVASC) 5 MG tablet Take 5 mg by mouth daily. 07/13/21  Yes [provider]  etonogestrel (NEXPLANON) 68 MG IMPL implant 1 each by Subdermal route once. Implanted summer 2020   Yes [provider]  Insulin Aspart (NOVOLOG IJ)  Inject as directed. Inject before every meal and when blood sugar spikes per insulin pump.   Yes [provider]  lisinopril (ZESTRIL) 40 MG tablet Take 40 mg by mouth every morning. 07/13/21  Yes [provider]  metoCLOPramide (REGLAN) 5 MG tablet Take 1 tablet (5 mg total) by mouth every 8 (eight) hours as needed for nausea. 12/04/21  Yes Leatha Gilding, MD  ondansetron (ZOFRAN-ODT) 4 MG disintegrating tablet Take 1 tablet (4 mg total) by mouth 2 (two) times daily as needed for nausea or vomiting. 12/04/21  Yes Gherghe, Daylene Katayama, MD  pantoprazole (PROTONIX) 40 MG tablet Take 1 tablet (40 mg total) by mouth daily as needed (heart burn). Patient taking differently: Take 40 mg by mouth daily. 08/05/21  Yes Rolly Salter, MD  PARoxetine (PAXIL) 20 MG tablet Take 20 mg by mouth daily. 07/13/21  Yes [provider]  Continuous Blood Gluc Sensor (FREESTYLE LIBRE 14 DAY SENSOR) MISC by Other route every fourteen (14) days. One libre sensor every 14 days 01/21/18   [provider]  fluticasone (FLONASE) 50 MCG/ACT nasal spray Place 2 sprays into both nostrils daily. Patient not taking: Reported on 12/02/2021 09/22/19   Nche, Bonna Gains, NP  Insulin Pen Needle (ULTRA-THIN II SHORT PEN NEEDLE) 31G X 8 MM MISC Inject into the skin. 01/21/18   [provider]  polyethylene glycol powder (GLYCOLAX/MIRALAX) 17 GM/SCOOP powder Take 17 g by mouth  daily. Patient not taking: Reported on 09/04/2021 09/22/19   Nche, Bonna Gains, NP  sucralfate (CARAFATE) 1 g tablet Take 1 tablet (1 g total) by mouth 4 (four) times daily -  with meals and at bedtime for 5 days. Patient not taking: Reported on 12/10/2021 09/12/21 12/02/21  Osvaldo Shipper, MD  ranitidine (ZANTAC) 75 MG tablet Take 75 mg by mouth daily as needed for heartburn.  06/15/20  [provider]    Allergies Aleve [naproxen sodium]   REVIEW OF SYSTEMS  Negative except as noted here or in the History of Present  Illness.   PHYSICAL EXAMINATION  Initial Vital Signs Blood pressure 102/66, pulse 89, temperature 98.6 F (37 C), temperature source Oral, resp. rate 11, height 5\' 4"  (1.626 m), weight 55.3 kg, SpO2 100 %.  Examination General: Well-developed, well-nourished female in no acute distress; appearance consistent with age of record HENT: normocephalic; atraumatic Eyes: Normal appearance Neck: supple Heart: regular rate and rhythm Lungs: clear to auscultation bilaterally Abdomen: soft; nondistended; nontender; bowel sounds present Extremities: No deformity; full range of motion; pulses normal Neurologic: Awake, alert and oriented; motor function intact in all extremities and symmetric; no facial droop Skin: Warm and dry Psychiatric: Normal mood and affect   RESULTS  Summary of this visit's results, reviewed and interpreted by myself:   EKG Interpretation  Date/Time:    Ventricular Rate:    PR Interval:    QRS Duration:   QT Interval:    QTC Calculation:   R Axis:     Text Interpretation:         Laboratory Studies: Results for orders placed or performed during the hospital encounter of 12/10/21 (from the past 24 hour(s))  Urinalysis, Routine w reflex microscopic Urine, Clean Catch     Status: Abnormal   Collection Time: 12/10/21  8:00 PM  Result Value Ref Range   Color, Urine AMBER (A) YELLOW   APPearance TURBID (A) CLEAR   Specific Gravity, Urine 1.017 1.005 - 1.030   pH 5.0 5.0 - 8.0   Glucose, UA NEGATIVE NEGATIVE mg/dL   Hgb urine dipstick NEGATIVE NEGATIVE   Bilirubin Urine NEGATIVE NEGATIVE   Ketones, ur 5 (A) NEGATIVE mg/dL   Protein, ur 12/12/21 (A) NEGATIVE mg/dL   Nitrite NEGATIVE NEGATIVE   Leukocytes,Ua TRACE (A) NEGATIVE   RBC / HPF 21-50 0 - 5 RBC/hpf   WBC, UA 11-20 0 - 5 WBC/hpf   Bacteria, UA FEW (A) NONE SEEN   Squamous Epithelial / LPF >50 (H) 0 - 5   Mucus PRESENT    Hyaline Casts, UA PRESENT    Amorphous Crystal PRESENT   CBG monitoring, ED      Status: Abnormal   Collection Time: 12/10/21  8:31 PM  Result Value Ref Range   Glucose-Capillary 168 (H) 70 - 99 mg/dL  CBC with Differential     Status: None   Collection Time: 12/10/21  9:05 PM  Result Value Ref Range   WBC 7.6 4.0 - 10.5 K/uL   RBC 4.42 3.87 - 5.11 MIL/uL   Hemoglobin 13.2 12.0 - 15.0 g/dL   HCT 12/12/21 03.7 - 04.8 %   MCV 89.4 80.0 - 100.0 fL   MCH 29.9 26.0 - 34.0 pg   MCHC 33.4 30.0 - 36.0 g/dL   RDW 88.9 16.9 - 45.0 %   Platelets 297 150 - 400 K/uL   nRBC 0.0 0.0 - 0.2 %   Neutrophils Relative % 47 %   Neutro Abs  3.6 1.7 - 7.7 K/uL   Lymphocytes Relative 45 %   Lymphs Abs 3.4 0.7 - 4.0 K/uL   Monocytes Relative 6 %   Monocytes Absolute 0.4 0.1 - 1.0 K/uL   Eosinophils Relative 1 %   Eosinophils Absolute 0.1 0.0 - 0.5 K/uL   Basophils Relative 1 %   Basophils Absolute 0.1 0.0 - 0.1 K/uL   Immature Granulocytes 0 %   Abs Immature Granulocytes 0.02 0.00 - 0.07 K/uL  Comprehensive metabolic panel     Status: Abnormal   Collection Time: 12/10/21  9:05 PM  Result Value Ref Range   Sodium 140 135 - 145 mmol/L   Potassium 3.4 (L) 3.5 - 5.1 mmol/L   Chloride 105 98 - 111 mmol/L   CO2 24 22 - 32 mmol/L   Glucose, Bld 146 (H) 70 - 99 mg/dL   BUN 20 6 - 20 mg/dL   Creatinine, Ser 3.81 (H) 0.44 - 1.00 mg/dL   Calcium 9.9 8.9 - 01.7 mg/dL   Total Protein 7.2 6.5 - 8.1 g/dL   Albumin 4.0 3.5 - 5.0 g/dL   AST 13 (L) 15 - 41 U/L   ALT 8 0 - 44 U/L   Alkaline Phosphatase 53 38 - 126 U/L   Total Bilirubin 0.7 0.3 - 1.2 mg/dL   GFR, Estimated 41 (L) >60 mL/min   Anion gap 11 5 - 15  Lipase, blood     Status: None   Collection Time: 12/10/21  9:05 PM  Result Value Ref Range   Lipase 21 11 - 51 U/L  Blood gas, venous     Status: Abnormal   Collection Time: 12/10/21  9:05 PM  Result Value Ref Range   pH, Ven 7.41 7.25 - 7.43   pCO2, Ven 46 44 - 60 mmHg   pO2, Ven 31 (LL) 32 - 45 mmHg   Bicarbonate 29.2 (H) 20.0 - 28.0 mmol/L   Acid-Base Excess 3.8 (H) 0.0 -  2.0 mmol/L   O2 Saturation 46.3 %   Patient temperature 37.0   I-Stat Beta hCG blood, ED ([facility], [facility], [facility] only)     Status: None   Collection Time: 12/10/21  9:09 PM  Result Value Ref Range   I-stat hCG, quantitative <5.0 <5 mIU/mL   Comment 3           Imaging Studies: No results found.  ED COURSE and MDM  Nursing notes, initial and subsequent vitals signs, including pulse oximetry, reviewed and interpreted by myself.  Vitals:   12/10/21 2215 12/10/21 2245 12/10/21 2300 12/10/21 2315  BP: 113/76 104/74 104/69 102/66  Pulse: 86 88 87 89  Resp: 14 14 13 11   Temp:      TempSrc:      SpO2: 100% 100% 100% 100%  Weight:      Height:       Medications  lactated ringers bolus 1,000 mL (has no administration in time range)  lactated ringers bolus 1,000 mL (1,000 mLs Intravenous New Bag/Given 12/10/21 2109)  ondansetron (ZOFRAN) injection 4 mg (4 mg Intravenous Given 12/10/21 2110)   11:44 PM No evidence of diabetic ketoacidosis on laboratory studies.  She does have a mild acute kidney injury which should improve with hydration.  We will give an additional bag of normal saline.   PROCEDURES  Procedures   ED DIAGNOSES  No diagnosis found.

## 2021-12-10 NOTE — ED Triage Notes (Signed)
Patient said she has gastroparesis. Was vomiting a lot yesterday. Type 1 diabetic. Said her sugars have been in the 140s. Feeling dehydrated.

## 2021-12-11 LAB — URINALYSIS, ROUTINE W REFLEX MICROSCOPIC
Bilirubin Urine: NEGATIVE
Glucose, UA: NEGATIVE mg/dL
Hgb urine dipstick: NEGATIVE
Ketones, ur: 5 mg/dL — AB
Leukocytes,Ua: NEGATIVE
Nitrite: NEGATIVE
Protein, ur: 100 mg/dL — AB
Specific Gravity, Urine: 1.015 (ref 1.005–1.030)
pH: 5 (ref 5.0–8.0)

## 2021-12-11 LAB — RAPID URINE DRUG SCREEN, HOSP PERFORMED
Amphetamines: NOT DETECTED
Barbiturates: NOT DETECTED
Benzodiazepines: NOT DETECTED
Cocaine: NOT DETECTED
Opiates: NOT DETECTED
Tetrahydrocannabinol: POSITIVE — AB

## 2021-12-11 LAB — CBG MONITORING, ED
Glucose-Capillary: 105 mg/dL — ABNORMAL HIGH (ref 70–99)
Glucose-Capillary: 259 mg/dL — ABNORMAL HIGH (ref 70–99)
Glucose-Capillary: 64 mg/dL — ABNORMAL LOW (ref 70–99)

## 2021-12-11 NOTE — ED Notes (Signed)
Pt provided graham crackers and peanut butter and cola. MD aware of CBG

## 2021-12-13 DIAGNOSIS — K5900 Constipation, unspecified: Secondary | ICD-10-CM | POA: Diagnosis not present

## 2021-12-13 DIAGNOSIS — R112 Nausea with vomiting, unspecified: Secondary | ICD-10-CM | POA: Diagnosis not present

## 2021-12-13 DIAGNOSIS — E109 Type 1 diabetes mellitus without complications: Secondary | ICD-10-CM | POA: Diagnosis not present

## 2021-12-13 DIAGNOSIS — Z794 Long term (current) use of insulin: Secondary | ICD-10-CM | POA: Diagnosis not present

## 2022-02-26 ENCOUNTER — Emergency Department (HOSPITAL_COMMUNITY)
Admission: EM | Admit: 2022-02-26 | Discharge: 2022-02-27 | Disposition: A | Discharge status: Home / Self Care | Payer: Medicaid Other | Attending: Emergency Medicine | Admitting: Emergency Medicine

## 2022-02-26 ENCOUNTER — Encounter (HOSPITAL_COMMUNITY): Payer: Self-pay

## 2022-02-26 ENCOUNTER — Other Ambulatory Visit: Payer: Self-pay

## 2022-02-26 DIAGNOSIS — R112 Nausea with vomiting, unspecified: Secondary | ICD-10-CM | POA: Insufficient documentation

## 2022-02-26 DIAGNOSIS — R000 Tachycardia, unspecified: Secondary | ICD-10-CM | POA: Diagnosis not present

## 2022-02-26 DIAGNOSIS — E1043 Type 1 diabetes mellitus with diabetic autonomic (poly)neuropathy: Secondary | ICD-10-CM | POA: Insufficient documentation

## 2022-02-26 DIAGNOSIS — K3184 Gastroparesis: Secondary | ICD-10-CM | POA: Insufficient documentation

## 2022-02-26 DIAGNOSIS — R1033 Periumbilical pain: Secondary | ICD-10-CM | POA: Diagnosis not present

## 2022-02-26 DIAGNOSIS — E1065 Type 1 diabetes mellitus with hyperglycemia: Secondary | ICD-10-CM | POA: Insufficient documentation

## 2022-02-26 DIAGNOSIS — Z79899 Other long term (current) drug therapy: Secondary | ICD-10-CM | POA: Diagnosis not present

## 2022-02-26 DIAGNOSIS — I10 Essential (primary) hypertension: Secondary | ICD-10-CM | POA: Insufficient documentation

## 2022-02-26 DIAGNOSIS — Z794 Long term (current) use of insulin: Secondary | ICD-10-CM | POA: Insufficient documentation

## 2022-02-26 DIAGNOSIS — R7989 Other specified abnormal findings of blood chemistry: Secondary | ICD-10-CM | POA: Insufficient documentation

## 2022-02-26 DIAGNOSIS — R110 Nausea: Secondary | ICD-10-CM

## 2022-02-26 DIAGNOSIS — D72829 Elevated white blood cell count, unspecified: Secondary | ICD-10-CM | POA: Insufficient documentation

## 2022-02-26 LAB — COMPREHENSIVE METABOLIC PANEL
ALT: 11 U/L (ref 0–44)
AST: 14 U/L — ABNORMAL LOW (ref 15–41)
Albumin: 4.8 g/dL (ref 3.5–5.0)
Alkaline Phosphatase: 71 U/L (ref 38–126)
Anion gap: 15 (ref 5–15)
BUN: 22 mg/dL — ABNORMAL HIGH (ref 6–20)
CO2: 24 mmol/L (ref 22–32)
Calcium: 10.4 mg/dL — ABNORMAL HIGH (ref 8.9–10.3)
Chloride: 104 mmol/L (ref 98–111)
Creatinine, Ser: 1.31 mg/dL — ABNORMAL HIGH (ref 0.44–1.00)
GFR, Estimated: 56 mL/min — ABNORMAL LOW (ref >60)
Glucose, Bld: 185 mg/dL — ABNORMAL HIGH (ref 70–99)
Potassium: 4 mmol/L (ref 3.5–5.1)
Sodium: 143 mmol/L (ref 135–145)
Total Bilirubin: 0.7 mg/dL (ref 0.3–1.2)
Total Protein: 8.5 g/dL — ABNORMAL HIGH (ref 6.5–8.1)

## 2022-02-26 LAB — LIPASE, BLOOD: Lipase: 22 U/L (ref 11–51)

## 2022-02-26 LAB — CBC WITH DIFFERENTIAL/PLATELET
Abs Immature Granulocytes: 0.05 10*3/uL (ref 0.00–0.07)
Basophils Absolute: 0 10*3/uL (ref 0.0–0.1)
Basophils Relative: 0 %
Eosinophils Absolute: 0 10*3/uL (ref 0.0–0.5)
Eosinophils Relative: 0 %
HCT: 41.5 % (ref 36.0–46.0)
Hemoglobin: 13.8 g/dL (ref 12.0–15.0)
Immature Granulocytes: 0 %
Lymphocytes Relative: 13 %
Lymphs Abs: 1.6 10*3/uL (ref 0.7–4.0)
MCH: 29.9 pg (ref 26.0–34.0)
MCHC: 33.3 g/dL (ref 30.0–36.0)
MCV: 90 fL (ref 80.0–100.0)
Monocytes Absolute: 0.7 10*3/uL (ref 0.1–1.0)
Monocytes Relative: 6 %
Neutro Abs: 10 10*3/uL — ABNORMAL HIGH (ref 1.7–7.7)
Neutrophils Relative %: 81 %
Platelets: 382 10*3/uL (ref 150–400)
RBC: 4.61 MIL/uL (ref 3.87–5.11)
RDW: 13 % (ref 11.5–15.5)
WBC: 12.3 10*3/uL — ABNORMAL HIGH (ref 4.0–10.5)
nRBC: 0 % (ref 0.0–0.2)

## 2022-02-26 LAB — CBG MONITORING, ED: Glucose-Capillary: 180 mg/dL — ABNORMAL HIGH (ref 70–99)

## 2022-02-26 NOTE — ED Triage Notes (Signed)
Pt c/o flare up of her gastroparesis. Pt states she is type 1 diabetic, has not been able to eat today. Pt states she has an insulin pump and it has been giving her insulin. Pt states her blood sugar is currently 206. Pt c/o N/V today.

## 2022-02-26 NOTE — ED Provider Triage Note (Signed)
Emergency Medicine Provider Triage Evaluation Note  [name] , a 31 y.o. female  was evaluated in triage.  Pt complains of abdominal pain that started yesterday with nausea and vomiting.  Patient states that the pain is located in the epigastric area, sharp, constant.  History of type 1 diabetes and gastroparesis.  Patient has an insulin pump and it has been given her insulins.  States current blood glucose 206.  No chest pain, shortness of breath, fever, constipation, diarrhea, rash. Review of Systems  Positive: As above Negative: As above  Physical Exam  BP (!) 153/109   Pulse (!) 115   Temp 98.3 F (36.8 C) (Oral)   Resp 18   SpO2 99%  Gen:   Awake, no distress   Resp:  Normal effort  MSK:   Moves extremities without difficulty  Other:  Tachycardic  Medical Decision Making  Medically screening exam initiated at 5:04 PM.  Appropriate orders placed.  [name] was informed that the remainder of the evaluation will be completed by another provider, this initial triage assessment does not replace that evaluation, and the importance of remaining in the ED until their evaluation is complete.     [name], [place] 02/27/22 [phone]

## 2022-02-27 LAB — URINALYSIS, ROUTINE W REFLEX MICROSCOPIC
Bilirubin Urine: NEGATIVE
Glucose, UA: NEGATIVE mg/dL
Hgb urine dipstick: NEGATIVE
Ketones, ur: 20 mg/dL — AB
Nitrite: NEGATIVE
Protein, ur: 100 mg/dL — AB
Specific Gravity, Urine: 1.017 (ref 1.005–1.030)
pH: 5 (ref 5.0–8.0)

## 2022-02-27 LAB — BLOOD GAS, VENOUS
Acid-Base Excess: 3.1 mmol/L — ABNORMAL HIGH (ref 0.0–2.0)
Bicarbonate: 27 mmol/L (ref 20.0–28.0)
O2 Saturation: 96.3 %
Patient temperature: 37
pCO2, Ven: 38 mmHg — ABNORMAL LOW (ref 44–60)
pH, Ven: 7.46 — ABNORMAL HIGH (ref 7.25–7.43)
pO2, Ven: 72 mmHg — ABNORMAL HIGH (ref 32–45)

## 2022-02-27 LAB — PREGNANCY, URINE: Preg Test, Ur: NEGATIVE

## 2022-02-27 LAB — CBG MONITORING, ED: Glucose-Capillary: 234 mg/dL — ABNORMAL HIGH (ref 70–99)

## 2022-02-27 LAB — BETA-HYDROXYBUTYRIC ACID: Beta-Hydroxybutyric Acid: 0.85 mmol/L — ABNORMAL HIGH (ref 0.05–0.27)

## 2022-02-27 MED ORDER — METOCLOPRAMIDE HCL 5 MG/ML IJ SOLN
10.0000 mg | Freq: Once | INTRAMUSCULAR | Status: AC
  Administered 2022-02-27: 10 mg via INTRAVENOUS
  Filled 2022-02-27: qty 2

## 2022-02-27 MED ORDER — INSULIN ASPART 100 UNIT/ML IJ SOLN
6.0000 [IU] | Freq: Once | INTRAMUSCULAR | Status: AC
Start: 2022-02-27 — End: 2022-02-27
  Administered 2022-02-27: 6 [IU] via SUBCUTANEOUS
  Filled 2022-02-27: qty 0.06

## 2022-02-27 MED ORDER — LACTATED RINGERS IV BOLUS
1000.0000 mL | Freq: Once | INTRAVENOUS | Status: AC
  Administered 2022-02-27: 1000 mL via INTRAVENOUS

## 2022-02-27 NOTE — Discharge Instructions (Signed)
You were seen in the ER today for your nausea, vomiting, and abdominal pain.  Your physical exam and laboratory studies were reassuring.  This likely is related to your underlying gastroparesis.  Please continue to use your insulin at home as prescribed, as well as your nausea medication and follow-up with your PCP and endocrinologist.  Return to ER with any severe symptoms.

## 2022-02-27 NOTE — ED Provider Notes (Signed)
[facility]-EMERGENCY DEPT Provider Note   CSN: [id number] Arrival date & time: 02/26/22  1635     History  Chief Complaint  Patient presents with   Abdominal Pain    [name] is a 31 y.o. female with history of type 1 diabetes and gastroparesis who states that she has been having central abdominal pain and nausea x 36 hours, wiwth 5 episodes of NBNBN emesis, consistent with her gastroparesis, not assisted with phenergan or zofran at home. She states her gastroenterologist did not want her on reglan "all the time" so she does not have any of this medication at home, though she states that it does typically help with her symptoms. Denies any infectious symptoms or fever.   I have personally reviewed her medical records.  In addition to type 1 diabetes she has history of hypertension, vitamin D deficiency, primary hypercoagulable state with protein S deficiency, and cannabis use.  She is not anticoagulated.  She has insulin pump on though she states that she ran out of insulin while she was waiting in the emergency department to be seen, was in the waiting room x9 hours.  HPI     Home Medications Prior to Admission medications   Medication Sig Start Date End Date Taking? Authorizing Provider  Albuterol Sulfate (PROAIR RESPICLICK) 108 (90 Base) MCG/ACT AEPB Inhale 1-2 puffs into the lungs every 6 (six) hours as needed. Patient taking differently: Inhale 2-3 puffs into the lungs as needed (cold symptoms). 09/22/19  Yes Nche, Bonna Gains, NP  amLODipine (NORVASC) 5 MG tablet Take 5 mg by mouth daily. 07/13/21  Yes [provider]  etonogestrel (NEXPLANON) 68 MG IMPL implant 1 each by Subdermal route once. Implanted summer 2020   Yes [provider]  Insulin Aspart (NOVOLOG IJ) Inject as directed. Inject before every meal and when blood sugar spikes per insulin pump.   Yes [provider]  lisinopril (ZESTRIL) 40 MG tablet Take 40 mg by  mouth every morning. 07/13/21  Yes [provider]  metoCLOPramide (REGLAN) 5 MG tablet Take 1 tablet (5 mg total) by mouth every 8 (eight) hours as needed for nausea. 12/04/21  Yes Leatha Gilding, MD  ondansetron (ZOFRAN-ODT) 4 MG disintegrating tablet Take 1 tablet (4 mg total) by mouth 2 (two) times daily as needed for nausea or vomiting. 12/04/21  Yes Gherghe, Daylene Katayama, MD  pantoprazole (PROTONIX) 40 MG tablet Take 1 tablet (40 mg total) by mouth daily as needed (heart burn). Patient taking differently: Take 40 mg by mouth daily. 08/05/21  Yes Rolly Salter, MD  PARoxetine (PAXIL) 20 MG tablet Take 20 mg by mouth daily. 07/13/21  Yes [provider]  promethazine (PHENERGAN) 25 MG tablet Take 25 mg by mouth every 8 (eight) hours as needed for nausea or vomiting.   Yes [provider]  Continuous Blood Gluc Sensor (FREESTYLE LIBRE 14 DAY SENSOR) MISC by Other route every fourteen (14) days. One libre sensor every 14 days 01/21/18   [provider]  Insulin Disposable Pump (OMNIPOD 5 G6 POD, GEN 5,) MISC SMARTSIG:SUB-Q Every 3 Days    [provider]  Insulin Pen Needle (ULTRA-THIN II SHORT PEN NEEDLE) 31G X 8 MM MISC Inject into the skin. 01/21/18   [provider]  polyethylene glycol powder (GLYCOLAX/MIRALAX) 17 GM/SCOOP powder Take 17 g by mouth daily. Patient not taking: Reported on 09/04/2021 09/22/19   Anne Ng, NP  ranitidine (ZANTAC) 75 MG tablet Take 75  mg by mouth daily as needed for heartburn.  06/15/20  [provider]      Allergies    Aleve [naproxen sodium]    Review of Systems   Review of Systems  Constitutional:  Positive for appetite change. Negative for chills, diaphoresis, fatigue and fever.  HENT: Negative.    Respiratory: Negative.    Cardiovascular: Negative.   Gastrointestinal:  Positive for abdominal pain, nausea and vomiting.  Genitourinary: Negative.     Physical Exam Updated Vital Signs BP  (!) 134/91   Pulse 96   Temp 98.2 F (36.8 C) (Oral)   Resp 16   SpO2 100%  Physical Exam Vitals and nursing note reviewed.  Constitutional:      Appearance: She is not ill-appearing or toxic-appearing.  HENT:     Head: Normocephalic and atraumatic.     Mouth/Throat:     Mouth: Mucous membranes are moist.     Pharynx: No oropharyngeal exudate or posterior oropharyngeal erythema.  Eyes:     General:        Right eye: No discharge.        Left eye: No discharge.     Extraocular Movements: Extraocular movements intact.     Conjunctiva/sclera: Conjunctivae normal.     Pupils: Pupils are equal, round, and reactive to light.  Cardiovascular:     Rate and Rhythm: Normal rate and regular rhythm.     Pulses: Normal pulses.     Heart sounds: Normal heart sounds. No murmur heard. Pulmonary:     Effort: Pulmonary effort is normal. No respiratory distress.     Breath sounds: Normal breath sounds. No wheezing or rales.  Abdominal:     General: Bowel sounds are normal. There is no distension.     Palpations: Abdomen is soft.     Tenderness: There is generalized abdominal tenderness. There is no right CVA tenderness, left CVA tenderness, guarding or rebound.     Comments: Generalized very mild tenderness palpation on abdominal exam  Musculoskeletal:        General: No deformity.     Cervical back: Neck supple.  Skin:    General: Skin is warm and dry.     Capillary Refill: Capillary refill takes less than 2 seconds.  Neurological:     General: No focal deficit present.     Mental Status: She is alert and oriented to person, place, and time. Mental status is at baseline.  Psychiatric:        Mood and Affect: Mood normal.     ED Results / Procedures / Treatments   Labs (all labs ordered are listed, but only abnormal results are displayed) Labs Reviewed  CBC WITH DIFFERENTIAL/PLATELET - Abnormal; Notable for the following components:      Result Value   WBC 12.3 (*)    Neutro Abs  10.0 (*)    All other components within normal limits  COMPREHENSIVE METABOLIC PANEL - Abnormal; Notable for the following components:   Glucose, Bld 185 (*)    BUN 22 (*)    Creatinine, Ser 1.31 (*)    Calcium 10.4 (*)    Total Protein 8.5 (*)    AST 14 (*)    GFR, Estimated 56 (*)    All other components within normal limits  URINALYSIS, ROUTINE W REFLEX MICROSCOPIC - Abnormal; Notable for the following components:   APPearance CLOUDY (*)    Ketones, ur 20 (*)    Protein, ur 100 (*)  Leukocytes,Ua TRACE (*)    Bacteria, UA FEW (*)    All other components within normal limits  BETA-HYDROXYBUTYRIC ACID - Abnormal; Notable for the following components:   Beta-Hydroxybutyric Acid 0.85 (*)    All other components within normal limits  BLOOD GAS, VENOUS - Abnormal; Notable for the following components:   pH, Ven 7.46 (*)    pCO2, Ven 38 (*)    pO2, Ven 72 (*)    Acid-Base Excess 3.1 (*)    All other components within normal limits  CBG MONITORING, ED - Abnormal; Notable for the following components:   Glucose-Capillary 180 (*)    All other components within normal limits  CBG MONITORING, ED - Abnormal; Notable for the following components:   Glucose-Capillary 234 (*)    All other components within normal limits  LIPASE, BLOOD  PREGNANCY, URINE  I-STAT VENOUS BLOOD GAS, ED    EKG None  Radiology No results found.  Procedures Procedures    Medications Ordered in ED Medications  insulin aspart (novoLOG) injection 6 Units (has no administration in time range)  lactated ringers bolus 1,000 mL (1,000 mLs Intravenous New Bag/Given 02/27/22 0226)  metoCLOPramide (REGLAN) injection 10 mg (10 mg Intravenous Given 02/27/22 0230)    ED Course/ Medical Decision Making/ A&P                           Medical Decision Making 31 year old female presents with concern for nausea vomiting and central abdominal discomfort in context of type 1 diabetes and  gastroparesis.  Hypertensive and tachycardic on intake though vomiting at the time.  Time of my evaluation patient is resting calmly in the hospital bed.  Cardiopulmonary exam is unremarkable, abdominal exam generally reassuring with generalized very mild tenderness palpation but no focal tenderness to palpation.  Neurovascular intact in all extremities.  Differential gnosis for your abdominal discomfort and nausea/vomiting includes with to gastroparesis, metabolic management, gastroenteritis, other infectious etiology in the abdomen or pelvis, UTI, pregnancy.  Amount and/or Complexity of Data Reviewed Labs: ordered.    Details: CBC with mild leukocytosis of 12,000, no anemia.  CMP with mild elevation in creatinine of 1.3 increased from patient's baseline of 0.92 months ago though improved from" reading of 1.7.  Hyperglycemia on CMP of 185.  UA without evidence of infection though she did have ketonuria and proteinuria, she denies urinary symptoms.  Lipase is normal. Betahydroxybutyric acid mildly elevated to 0.8, VBG without acidosis.   Risk Prescription drug management.   Patient reevaluated and feeling significant improved after fluids and IV Reglan, tolerating p.o. at this time.  Clinical picture most consistent with patient's baseline gastroparesis.  Clinical concern for emergent underlying etiology would warrant further ED work-up or inpatient management is exceedingly low.  Admission considered but not felt warranted at this time.  Patient expressing wishes to be discharged at this time.  Do feel is reasonable.  [name] voiced understanding of her medical evaluation and treatment plan. Each of their questions answered to their expressed satisfaction.  Return precautions were given.  Patient is well-appearing, stable, and was discharged in good condition.  This chart was dictated using voice recognition software, Dragon. Despite the best efforts of this provider to proofread and correct  errors, errors may still occur which can change documentation meaning.    Final Clinical Impression(s) / ED Diagnoses Final diagnoses:  Periumbilical abdominal pain  Nausea    Rx / DC Orders ED Discharge Orders  None         Sherrilee GillesSponseller, Andruw Battie R, PA-C 02/27/22 0418    Nira Connardama, Pedro Eduardo, MD 02/27/22 [phone]

## 2022-02-28 ENCOUNTER — Emergency Department (HOSPITAL_COMMUNITY)
Admission: EM | Admit: 2022-02-28 | Discharge: 2022-02-28 | Disposition: A | Discharge status: Home / Self Care | Payer: Medicaid Other | Attending: Emergency Medicine | Admitting: Emergency Medicine

## 2022-02-28 DIAGNOSIS — J45909 Unspecified asthma, uncomplicated: Secondary | ICD-10-CM | POA: Insufficient documentation

## 2022-02-28 DIAGNOSIS — Z79899 Other long term (current) drug therapy: Secondary | ICD-10-CM | POA: Insufficient documentation

## 2022-02-28 DIAGNOSIS — E1143 Type 2 diabetes mellitus with diabetic autonomic (poly)neuropathy: Secondary | ICD-10-CM

## 2022-02-28 DIAGNOSIS — Z794 Long term (current) use of insulin: Secondary | ICD-10-CM | POA: Diagnosis not present

## 2022-02-28 DIAGNOSIS — I10 Essential (primary) hypertension: Secondary | ICD-10-CM | POA: Diagnosis not present

## 2022-02-28 DIAGNOSIS — E876 Hypokalemia: Secondary | ICD-10-CM | POA: Diagnosis not present

## 2022-02-28 DIAGNOSIS — E1043 Type 1 diabetes mellitus with diabetic autonomic (poly)neuropathy: Secondary | ICD-10-CM | POA: Diagnosis not present

## 2022-02-28 DIAGNOSIS — K3184 Gastroparesis: Secondary | ICD-10-CM | POA: Diagnosis not present

## 2022-02-28 DIAGNOSIS — R1110 Vomiting, unspecified: Secondary | ICD-10-CM | POA: Diagnosis present

## 2022-02-28 LAB — URINALYSIS, ROUTINE W REFLEX MICROSCOPIC
Bacteria, UA: NONE SEEN
Bilirubin Urine: NEGATIVE
Glucose, UA: NEGATIVE mg/dL
Hgb urine dipstick: NEGATIVE
Ketones, ur: 20 mg/dL — AB
Leukocytes,Ua: NEGATIVE
Nitrite: NEGATIVE
Protein, ur: 100 mg/dL — AB
Specific Gravity, Urine: 1.016 (ref 1.005–1.030)
pH: 5 (ref 5.0–8.0)

## 2022-02-28 LAB — CBC WITH DIFFERENTIAL/PLATELET
Abs Immature Granulocytes: 0.03 10*3/uL (ref 0.00–0.07)
Basophils Absolute: 0.1 10*3/uL (ref 0.0–0.1)
Basophils Relative: 1 %
Eosinophils Absolute: 0 10*3/uL (ref 0.0–0.5)
Eosinophils Relative: 0 %
HCT: 40.3 % (ref 36.0–46.0)
Hemoglobin: 13.5 g/dL (ref 12.0–15.0)
Immature Granulocytes: 0 %
Lymphocytes Relative: 23 %
Lymphs Abs: 2.1 10*3/uL (ref 0.7–4.0)
MCH: 29.5 pg (ref 26.0–34.0)
MCHC: 33.5 g/dL (ref 30.0–36.0)
MCV: 88.2 fL (ref 80.0–100.0)
Monocytes Absolute: 0.6 10*3/uL (ref 0.1–1.0)
Monocytes Relative: 7 %
Neutro Abs: 6.1 10*3/uL (ref 1.7–7.7)
Neutrophils Relative %: 69 %
Platelets: 316 10*3/uL (ref 150–400)
RBC: 4.57 MIL/uL (ref 3.87–5.11)
RDW: 12.7 % (ref 11.5–15.5)
WBC: 8.8 10*3/uL (ref 4.0–10.5)
nRBC: 0 % (ref 0.0–0.2)

## 2022-02-28 LAB — COMPREHENSIVE METABOLIC PANEL
ALT: 11 U/L (ref 0–44)
AST: 15 U/L (ref 15–41)
Albumin: 4.3 g/dL (ref 3.5–5.0)
Alkaline Phosphatase: 64 U/L (ref 38–126)
Anion gap: 9 (ref 5–15)
BUN: 22 mg/dL — ABNORMAL HIGH (ref 6–20)
CO2: 25 mmol/L (ref 22–32)
Calcium: 9.8 mg/dL (ref 8.9–10.3)
Chloride: 103 mmol/L (ref 98–111)
Creatinine, Ser: 1.16 mg/dL — ABNORMAL HIGH (ref 0.44–1.00)
GFR, Estimated: >60 mL/min (ref >60)
Glucose, Bld: 146 mg/dL — ABNORMAL HIGH (ref 70–99)
Potassium: 3.2 mmol/L — ABNORMAL LOW (ref 3.5–5.1)
Sodium: 137 mmol/L (ref 135–145)
Total Bilirubin: 0.9 mg/dL (ref 0.3–1.2)
Total Protein: 7.9 g/dL (ref 6.5–8.1)

## 2022-02-28 LAB — I-STAT BETA HCG BLOOD, ED (MC, WL, AP ONLY): I-stat hCG, quantitative: <5 m[IU]/mL (ref <5)

## 2022-02-28 LAB — CBG MONITORING, ED
Glucose-Capillary: 149 mg/dL — ABNORMAL HIGH (ref 70–99)
Glucose-Capillary: 203 mg/dL — ABNORMAL HIGH (ref 70–99)

## 2022-02-28 LAB — LIPASE, BLOOD: Lipase: 24 U/L (ref 11–51)

## 2022-02-28 MED ORDER — LACTATED RINGERS IV BOLUS
1000.0000 mL | Freq: Once | INTRAVENOUS | Status: AC
  Administered 2022-02-28: 1000 mL via INTRAVENOUS

## 2022-02-28 MED ORDER — METOCLOPRAMIDE HCL 10 MG PO TABS: 10.0000 mg | ORAL_TABLET | Freq: Four times a day (QID) | ORAL | 0 refills | Status: DC

## 2022-02-28 MED ORDER — PANTOPRAZOLE SODIUM 40 MG IV SOLR
40.0000 mg | Freq: Once | INTRAVENOUS | Status: AC
  Administered 2022-02-28: 40 mg via INTRAVENOUS
  Filled 2022-02-28: qty 10

## 2022-02-28 MED ORDER — METOCLOPRAMIDE HCL 5 MG/ML IJ SOLN
10.0000 mg | Freq: Once | INTRAMUSCULAR | Status: AC
  Administered 2022-02-28: 10 mg via INTRAVENOUS
  Filled 2022-02-28: qty 2

## 2022-02-28 MED ORDER — LACTATED RINGERS IV SOLN: INTRAVENOUS | Status: DC

## 2022-02-28 NOTE — ED Triage Notes (Signed)
Pt reports abd pain with n/v, seen in ER on 11/13 with improvement and symptoms have returned.  Hx DM and gastroparesis, no missed medicine.

## 2022-02-28 NOTE — ED Provider Notes (Signed)
[facility]-EMERGENCY DEPT Provider Note   CSN: [id number] Arrival date & time: 02/28/22  0732     History  Chief Complaint  Patient presents with   Abdominal Pain    [name] is a 31 y.o. female.  Patient is a 31 year old female with a history of type 1 diabetes currently on insulin pump, gastroparesis, hypertension and asthma who is presenting today with recurrent vomiting.  Patient reports she went to the hospital Monday night but waited for approximately 18 hours before she was seen since you ended up being seen yesterday.  She was having recurrent vomiting and cannot hold anything down.  She has been having a burning in her throat and stomach.  After IV fluids and Reglan she was feeling much better.  When she got home she was feeling pretty good and she tried to eat a cheeseburger.  After that she started vomiting again.  She figured it was just too early to eat that much food.  Her sugars have been okay.  She has bolused herself when needed because the higher sugar was 260.  Her pump is still on and functioning appropriately.  She has not had any fever, dysuria, frequency or urgency.  She has not had any cough, shortness of breath or chest pain.  Her only complaint is pain in her stomach with vomiting that she feels in her back and an acid sensation in her chest.  She does have Zofran and promethazine at home that she can use when she feels nauseated but reports after she starts vomiting she cannot hold it down.  The history is provided by the patient.  Abdominal Pain      Home Medications Prior to Admission medications   Medication Sig Start Date End Date Taking? Authorizing Provider  metoCLOPramide (REGLAN) 10 MG tablet Take 1 tablet (10 mg total) by mouth every 6 (six) hours. 02/28/22  Yes Rhealyn Cullen, Alphonzo Lemmings, MD  Albuterol Sulfate (PROAIR RESPICLICK) 108 (90 Base) MCG/ACT AEPB Inhale 1-2 puffs into the lungs every 6 (six) hours as needed. Patient  taking differently: Inhale 2-3 puffs into the lungs as needed (cold symptoms). 09/22/19   Nche, Bonna Gains, NP  amLODipine (NORVASC) 5 MG tablet Take 5 mg by mouth daily. 07/13/21   [provider]  Continuous Blood Gluc Sensor (FREESTYLE LIBRE 14 DAY SENSOR) MISC by Other route every fourteen (14) days. One libre sensor every 14 days 01/21/18   [provider]  etonogestrel (NEXPLANON) 68 MG IMPL implant 1 each by Subdermal route once. Implanted summer 2020    [provider]  Insulin Aspart (NOVOLOG IJ) Inject as directed. Inject before every meal and when blood sugar spikes per insulin pump.    [provider]  Insulin Disposable Pump (OMNIPOD 5 G6 POD, GEN 5,) MISC SMARTSIG:SUB-Q Every 3 Days    [provider]  Insulin Pen Needle (ULTRA-THIN II SHORT PEN NEEDLE) 31G X 8 MM MISC Inject into the skin. 01/21/18   [provider]  lisinopril (ZESTRIL) 40 MG tablet Take 40 mg by mouth every morning. 07/13/21   [provider]  ondansetron (ZOFRAN-ODT) 4 MG disintegrating tablet Take 1 tablet (4 mg total) by mouth 2 (two) times daily as needed for nausea or vomiting. 12/04/21   Leatha Gilding, MD  pantoprazole (PROTONIX) 40 MG tablet Take 1 tablet (40 mg total) by mouth daily as needed (heart burn). Patient taking differently: Take 40 mg by mouth daily. 08/05/21   Rolly Salter,  MD  PARoxetine (PAXIL) 20 MG tablet Take 20 mg by mouth daily. 07/13/21   [provider]  polyethylene glycol powder (GLYCOLAX/MIRALAX) 17 GM/SCOOP powder Take 17 g by mouth daily. Patient not taking: Reported on 09/04/2021 09/22/19   Nche, Charlene Brooke, NP  promethazine (PHENERGAN) 25 MG tablet Take 25 mg by mouth every 8 (eight) hours as needed for nausea or vomiting.    [provider]  ranitidine (ZANTAC) 75 MG tablet Take 75 mg by mouth daily as needed for heartburn.  06/15/20  [provider]      Allergies    Aleve [naproxen sodium]     Review of Systems   Review of Systems  Gastrointestinal:  Positive for abdominal pain.    Physical Exam Updated Vital Signs BP (!) 150/95 (BP Location: Left Arm)   Pulse (!) 108   Temp 98.8 F (37.1 C) (Oral)   Resp 17   Ht 5\' 4"  (1.626 m)   Wt 55 kg   SpO2 100%   BMI 20.81 kg/m  Physical Exam Vitals and nursing note reviewed.  Constitutional:      General: She is not in acute distress.    Appearance: She is well-developed.  HENT:     Head: Normocephalic and atraumatic.  Eyes:     Pupils: Pupils are equal, round, and reactive to light.  Cardiovascular:     Rate and Rhythm: Regular rhythm. Tachycardia present.     Heart sounds: Normal heart sounds. No murmur heard.    No friction rub.  Pulmonary:     Effort: Pulmonary effort is normal.     Breath sounds: Normal breath sounds. No wheezing or rales.  Abdominal:     General: Bowel sounds are normal. There is no distension.     Palpations: Abdomen is soft.     Tenderness: There is abdominal tenderness. There is no guarding or rebound.     Comments: Minimal epigastric tenderness  Musculoskeletal:        General: No tenderness. Normal range of motion.     Right lower leg: No edema.     Left lower leg: No edema.     Comments: No edema  Skin:    General: Skin is warm and dry.     Findings: No rash.  Neurological:     Mental Status: She is alert and oriented to person, place, and time. Mental status is at baseline.     Cranial Nerves: No cranial nerve deficit.  Psychiatric:        Mood and Affect: Mood normal.        Behavior: Behavior normal.     ED Results / Procedures / Treatments   Labs (all labs ordered are listed, but only abnormal results are displayed) Labs Reviewed  COMPREHENSIVE METABOLIC PANEL - Abnormal; Notable for the following components:      Result Value   Potassium 3.2 (*)    Glucose, Bld 146 (*)    BUN 22 (*)    Creatinine, Ser 1.16 (*)    All other components within normal limits   URINALYSIS, ROUTINE W REFLEX MICROSCOPIC - Abnormal; Notable for the following components:   APPearance HAZY (*)    Ketones, ur 20 (*)    Protein, ur 100 (*)    All other components within normal limits  CBG MONITORING, ED - Abnormal; Notable for the following components:   Glucose-Capillary 149 (*)    All other components within normal limits  CBG MONITORING, ED -  Abnormal; Notable for the following components:   Glucose-Capillary 203 (*)    All other components within normal limits  CBC WITH DIFFERENTIAL/PLATELET  LIPASE, BLOOD  I-STAT BETA HCG BLOOD, ED ([facility], [facility], [facility] ONLY)  I-STAT BETA HCG BLOOD, ED ([facility], [facility], [facility] ONLY)    EKG None  Radiology No results found.  Procedures Procedures    Medications Ordered in ED Medications  lactated ringers infusion (has no administration in time range)  lactated ringers bolus 1,000 mL (1,000 mLs Intravenous New Bag/Given 02/28/22 1415)  metoCLOPramide (REGLAN) injection 10 mg (10 mg Intravenous Given 02/28/22 1414)  pantoprazole (PROTONIX) injection 40 mg (40 mg Intravenous Given 02/28/22 1414)    ED Course/ Medical Decision Making/ A&P                           Medical Decision Making Amount and/or Complexity of Data Reviewed External Data Reviewed: notes. Labs: ordered. Decision-making details documented in ED Course.  Risk Prescription drug management.   Pt with multiple medical problems and comorbidities and presenting today with a complaint that caries a high risk for morbidity and mortality.  Here today with recurrent vomiting.  Patient does not appear to be in DKA at this time, suspect diabetic gastroparesis flare.  She was seen yesterday and improved after fluids and Reglan but then try to eat a cheeseburger and the vomiting started again.  I independently interpreted patient's labs today and CBC within normal limits, CMP with normal anion gap of 9, stable creatinine 1.16 and minimal hypokalemia of 3.2, blood sugar has ranged  between 150-200.  UA with 20 ketones but no other acute findings. We will give patient IV fluids and antiemetics and reevaluate.  Low suspicion for an acute abdominal process such as obstruction, appendicitis, pancreatitis, hepatitis or diverticulitis.   4:16 PM After 1 bag of fluid and 1 dose of Reglan patient reports she is feeling better.  She was able to tolerate some crackers and soda.  She is requesting discharge home.  She was given a short prescription for Reglan to use over the next few days to avoid recurrent vomiting as her other medications at home have not been helpful.        Final Clinical Impression(s) / ED Diagnoses Final diagnoses:  Diabetic gastroparesis ([place])    Rx / DC Orders ED Discharge Orders          Ordered    metoCLOPramide (REGLAN) 10 MG tablet  Every 6 hours        02/28/22 1615              Blanchie Dessert, MD 02/28/22 1617

## 2022-02-28 NOTE — ED Provider Triage Note (Signed)
Emergency Medicine Provider Triage Evaluation Note  [name] , a 31 y.o. female  was evaluated in triage.  Pt complains of epigastric abd pain,  nausea, and vomiting since Sunday. She was seen yesterday and felt better after tx. Symptoms returned last night. Hx of DM1 and gastroparesis. No new symptoms different from typical gastroparesis sx.  Review of Systems  Positive:  Negative:   Physical Exam  BP (!) 183/116 (BP Location: Left Arm)   Pulse (!) 115   Temp 98.4 F (36.9 C) (Oral)   Resp 18   SpO2 98%  Gen:   Awake, no distress   Resp:  Normal effort  MSK:   Moves extremities without difficulty  Other:  + epigastric tenderness  Medical Decision Making  Medically screening exam initiated at 7:45 AM.  Appropriate orders placed.  [name] was informed that the remainder of the evaluation will be completed by another provider, this initial triage assessment does not replace that evaluation, and the importance of remaining in the ED until their evaluation is complete.     Claudie Leach, [place] 02/28/22 [phone]

## 2022-02-28 NOTE — Discharge Instructions (Signed)
Your lab work still looked okay today.  No evidence of urinary tract infection but you did have a few ketones in your urine.  Make sure you stick with a very bland diet over the next few days as your body is adjusting.  If you start having recurrent vomiting again or any fever inability to control your sugars return to the emergency room.

## 2022-03-06 DIAGNOSIS — Z794 Long term (current) use of insulin: Secondary | ICD-10-CM | POA: Diagnosis not present

## 2022-03-06 DIAGNOSIS — I10 Essential (primary) hypertension: Secondary | ICD-10-CM | POA: Diagnosis not present

## 2022-03-06 DIAGNOSIS — F32A Depression, unspecified: Secondary | ICD-10-CM | POA: Diagnosis not present

## 2022-03-06 DIAGNOSIS — E1065 Type 1 diabetes mellitus with hyperglycemia: Secondary | ICD-10-CM | POA: Diagnosis not present

## 2022-03-27 DIAGNOSIS — R112 Nausea with vomiting, unspecified: Secondary | ICD-10-CM | POA: Diagnosis not present

## 2022-03-27 DIAGNOSIS — K5909 Other constipation: Secondary | ICD-10-CM | POA: Diagnosis not present

## 2022-03-27 DIAGNOSIS — E109 Type 1 diabetes mellitus without complications: Secondary | ICD-10-CM | POA: Diagnosis not present

## 2022-03-27 DIAGNOSIS — R10817 Generalized abdominal tenderness: Secondary | ICD-10-CM | POA: Diagnosis not present

## 2022-04-01 ENCOUNTER — Encounter (HOSPITAL_COMMUNITY): Payer: Self-pay

## 2022-04-01 ENCOUNTER — Other Ambulatory Visit: Payer: Self-pay

## 2022-04-01 ENCOUNTER — Emergency Department (HOSPITAL_COMMUNITY)
Admission: EM | Admit: 2022-04-01 | Discharge: 2022-04-01 | Disposition: A | Discharge status: Home / Self Care | Payer: Medicaid Other | Attending: Emergency Medicine | Admitting: Emergency Medicine

## 2022-04-01 DIAGNOSIS — R1010 Upper abdominal pain, unspecified: Secondary | ICD-10-CM | POA: Diagnosis not present

## 2022-04-01 DIAGNOSIS — E119 Type 2 diabetes mellitus without complications: Secondary | ICD-10-CM | POA: Diagnosis not present

## 2022-04-01 DIAGNOSIS — K3184 Gastroparesis: Secondary | ICD-10-CM | POA: Diagnosis not present

## 2022-04-01 DIAGNOSIS — R112 Nausea with vomiting, unspecified: Secondary | ICD-10-CM | POA: Diagnosis not present

## 2022-04-01 DIAGNOSIS — Z794 Long term (current) use of insulin: Secondary | ICD-10-CM | POA: Diagnosis not present

## 2022-04-01 LAB — CBC WITH DIFFERENTIAL/PLATELET
Abs Immature Granulocytes: 0.02 10*3/uL (ref 0.00–0.07)
Basophils Absolute: 0.1 10*3/uL (ref 0.0–0.1)
Basophils Relative: 1 %
Eosinophils Absolute: 0.1 10*3/uL (ref 0.0–0.5)
Eosinophils Relative: 1 %
HCT: 41.3 % (ref 36.0–46.0)
Hemoglobin: 13.9 g/dL (ref 12.0–15.0)
Immature Granulocytes: 0 %
Lymphocytes Relative: 24 %
Lymphs Abs: 1.7 10*3/uL (ref 0.7–4.0)
MCH: 30.2 pg (ref 26.0–34.0)
MCHC: 33.7 g/dL (ref 30.0–36.0)
MCV: 89.6 fL (ref 80.0–100.0)
Monocytes Absolute: 0.4 10*3/uL (ref 0.1–1.0)
Monocytes Relative: 5 %
Neutro Abs: 5 10*3/uL (ref 1.7–7.7)
Neutrophils Relative %: 69 %
Platelets: 322 10*3/uL (ref 150–400)
RBC: 4.61 MIL/uL (ref 3.87–5.11)
RDW: 12.4 % (ref 11.5–15.5)
WBC: 7.2 10*3/uL (ref 4.0–10.5)
nRBC: 0 % (ref 0.0–0.2)

## 2022-04-01 LAB — COMPREHENSIVE METABOLIC PANEL
ALT: 8 U/L (ref 0–44)
AST: 13 U/L — ABNORMAL LOW (ref 15–41)
Albumin: 4.5 g/dL (ref 3.5–5.0)
Alkaline Phosphatase: 61 U/L (ref 38–126)
Anion gap: 14 (ref 5–15)
BUN: 15 mg/dL (ref 6–20)
CO2: 18 mmol/L — ABNORMAL LOW (ref 22–32)
Calcium: 10 mg/dL (ref 8.9–10.3)
Chloride: 106 mmol/L (ref 98–111)
Creatinine, Ser: 1.2 mg/dL — ABNORMAL HIGH (ref 0.44–1.00)
GFR, Estimated: >60 mL/min (ref >60)
Glucose, Bld: 194 mg/dL — ABNORMAL HIGH (ref 70–99)
Potassium: 3.9 mmol/L (ref 3.5–5.1)
Sodium: 138 mmol/L (ref 135–145)
Total Bilirubin: 1.2 mg/dL (ref 0.3–1.2)
Total Protein: 8.2 g/dL — ABNORMAL HIGH (ref 6.5–8.1)

## 2022-04-01 LAB — URINALYSIS, ROUTINE W REFLEX MICROSCOPIC
Bacteria, UA: NONE SEEN
Bilirubin Urine: NEGATIVE
Glucose, UA: NEGATIVE mg/dL
Hgb urine dipstick: NEGATIVE
Ketones, ur: 20 mg/dL — AB
Leukocytes,Ua: NEGATIVE
Nitrite: NEGATIVE
Protein, ur: 100 mg/dL — AB
Specific Gravity, Urine: 1.015 (ref 1.005–1.030)
pH: 5 (ref 5.0–8.0)

## 2022-04-01 LAB — LIPASE, BLOOD: Lipase: 22 U/L (ref 11–51)

## 2022-04-01 LAB — I-STAT BETA HCG BLOOD, ED (MC, WL, AP ONLY): I-stat hCG, quantitative: <5 m[IU]/mL (ref <5)

## 2022-04-01 LAB — CBG MONITORING, ED
Glucose-Capillary: 164 mg/dL — ABNORMAL HIGH (ref 70–99)
Glucose-Capillary: 180 mg/dL — ABNORMAL HIGH (ref 70–99)

## 2022-04-01 LAB — BETA-HYDROXYBUTYRIC ACID: Beta-Hydroxybutyric Acid: 2.82 mmol/L — ABNORMAL HIGH (ref 0.05–0.27)

## 2022-04-01 MED ORDER — PROMETHAZINE HCL 25 MG PO TABS: 25.0000 mg | ORAL_TABLET | Freq: Four times a day (QID) | ORAL | 0 refills | Status: DC | PRN

## 2022-04-01 MED ORDER — SODIUM CHLORIDE 0.9 % IV BOLUS
1000.0000 mL | Freq: Once | INTRAVENOUS | Status: AC
  Administered 2022-04-01: 1000 mL via INTRAVENOUS

## 2022-04-01 MED ORDER — ONDANSETRON 8 MG PO TBDP
8.0000 mg | ORAL_TABLET | Freq: Once | ORAL | Status: AC
  Administered 2022-04-01: 8 mg via ORAL
  Filled 2022-04-01: qty 1

## 2022-04-01 MED ORDER — METOCLOPRAMIDE HCL 5 MG/ML IJ SOLN
10.0000 mg | Freq: Once | INTRAMUSCULAR | Status: AC
  Administered 2022-04-01: 10 mg via INTRAVENOUS
  Filled 2022-04-01: qty 2

## 2022-04-01 MED ORDER — ONDANSETRON HCL 4 MG PO TABS: 4.0000 mg | ORAL_TABLET | Freq: Four times a day (QID) | ORAL | 0 refills | Status: DC

## 2022-04-01 MED ORDER — ONDANSETRON HCL 4 MG/2ML IJ SOLN
4.0000 mg | Freq: Once | INTRAMUSCULAR | Status: AC
  Administered 2022-04-01: 4 mg via INTRAVENOUS
  Filled 2022-04-01: qty 2

## 2022-04-01 MED ORDER — SODIUM CHLORIDE 0.9 % IV BOLUS
500.0000 mL | Freq: Once | INTRAVENOUS | Status: AC
  Administered 2022-04-01: 500 mL via INTRAVENOUS

## 2022-04-01 MED ORDER — METOCLOPRAMIDE HCL 10 MG PO TABS: 10.0000 mg | ORAL_TABLET | Freq: Four times a day (QID) | ORAL | 0 refills | Status: DC

## 2022-04-01 MED ORDER — PANTOPRAZOLE SODIUM 40 MG IV SOLR
40.0000 mg | Freq: Once | INTRAVENOUS | Status: AC
  Administered 2022-04-01: 40 mg via INTRAVENOUS
  Filled 2022-04-01: qty 10

## 2022-04-01 NOTE — ED Triage Notes (Addendum)
Patient reports that she is a Type I diabetic. Patient c/o abdominal pain and emesis x 4 days.  CBG in triage-164.

## 2022-04-01 NOTE — Discharge Instructions (Signed)
Return for any problem.  ?

## 2022-04-01 NOTE — ED Provider Notes (Signed)
[facility] DEPT Provider Note   CSN: [id number] Arrival date & time: 04/01/22  1504     History  Chief Complaint  Patient presents with   Abdominal Pain   Emesis    [name] is a 31 y.o. female.  31 year old female with prior medical history as detailed below presents for evaluation.  Patient with longstanding history of gastroparesis.  Patient reports onset of nausea, vomiting, upper abdominal cramping over the last 3 to 4 days.  Patient reports that she is taking antiemetics at home without significant improvement in symptoms.  She has been checking her sugars at home and they have been around 150 consistently.  She denies fever.  She denies diarrhea.  She denies any significant recent alcohol use or marijuana use.  Last episode of gastroparesis that required hospital evaluation was approximately 1 month ago.  The history is provided by the patient and medical records.       Home Medications Prior to Admission medications   Medication Sig Start Date End Date Taking? Authorizing Provider  Albuterol Sulfate (PROAIR RESPICLICK) [id number] (90 Base) MCG/ACT AEPB Inhale 1-2 puffs into the lungs every 6 (six) hours as needed. Patient taking differently: Inhale 2-3 puffs into the lungs as needed (cold symptoms). 09/22/19   Nche, Charlene Brooke, NP  amLODipine (NORVASC) 5 MG tablet Take 5 mg by mouth daily. 07/13/21   [provider]  Continuous Blood Gluc Sensor (FREESTYLE LIBRE 14 DAY SENSOR) MISC by Other route every fourteen (14) days. One libre sensor every 14 days 01/21/18   [provider]  etonogestrel (NEXPLANON) 68 MG IMPL implant 1 each by Subdermal route once. Implanted summer 2020    [provider]  Insulin Aspart (NOVOLOG IJ) Inject as directed. Inject before every meal and when blood sugar spikes per insulin pump.    [provider]  Insulin Disposable Pump (OMNIPOD 5 G6 POD, GEN 5,) MISC SMARTSIG:SUB-Q  Every 3 Days    [provider]  Insulin Pen Needle (ULTRA-THIN II SHORT PEN NEEDLE) 31G X 8 MM MISC Inject into the skin. 01/21/18   [provider]  lisinopril (ZESTRIL) 40 MG tablet Take 40 mg by mouth every morning. 07/13/21   [provider]  metoCLOPramide (REGLAN) 10 MG tablet Take 1 tablet (10 mg total) by mouth every 6 (six) hours. 02/28/22   Blanchie Dessert, MD  ondansetron (ZOFRAN-ODT) 4 MG disintegrating tablet Take 1 tablet (4 mg total) by mouth 2 (two) times daily as needed for nausea or vomiting. 12/04/21   Caren Griffins, MD  pantoprazole (PROTONIX) 40 MG tablet Take 1 tablet (40 mg total) by mouth daily as needed (heart burn). Patient taking differently: Take 40 mg by mouth daily. 08/05/21   Lavina Hamman, MD  PARoxetine (PAXIL) 20 MG tablet Take 20 mg by mouth daily. 07/13/21   [provider]  polyethylene glycol powder (GLYCOLAX/MIRALAX) 17 GM/SCOOP powder Take 17 g by mouth daily. Patient not taking: Reported on 09/04/2021 09/22/19   Nche, Charlene Brooke, NP  promethazine (PHENERGAN) 25 MG tablet Take 25 mg by mouth every 8 (eight) hours as needed for nausea or vomiting.    [provider]  ranitidine (ZANTAC) 75 MG tablet Take 75 mg by mouth daily as needed for heartburn.  06/15/20  [provider]      Allergies    Aleve [naproxen sodium]    Review of Systems   Review of Systems  All other systems reviewed and are  negative.   Physical Exam Updated Vital Signs BP (!) 155/113 (BP Location: Left Arm)   Temp 99.3 F (37.4 C) (Oral)   Resp 18   Ht 5\' 4"  (1.626 m)   Wt 51.7 kg   SpO2 100%   BMI 19.57 kg/m  Physical Exam Vitals and nursing note reviewed.  Constitutional:      General: She is not in acute distress.    Appearance: Normal appearance. She is well-developed.  HENT:     Head: Normocephalic and atraumatic.  Eyes:     Conjunctiva/sclera: Conjunctivae normal.     Pupils: Pupils are equal, round, and  reactive to light.  Cardiovascular:     Rate and Rhythm: Normal rate and regular rhythm.     Heart sounds: Normal heart sounds.  Pulmonary:     Effort: Pulmonary effort is normal. No respiratory distress.     Breath sounds: Normal breath sounds.  Abdominal:     General: There is no distension.     Palpations: Abdomen is soft.     Tenderness: There is no abdominal tenderness.  Musculoskeletal:        General: No deformity. Normal range of motion.     Cervical back: Normal range of motion and neck supple.  Skin:    General: Skin is warm and dry.  Neurological:     General: No focal deficit present.     Mental Status: She is alert and oriented to person, place, and time.     ED Results / Procedures / Treatments   Labs (all labs ordered are listed, but only abnormal results are displayed) Labs Reviewed  COMPREHENSIVE METABOLIC PANEL - Abnormal; Notable for the following components:      Result Value   CO2 18 (*)    Glucose, Bld 194 (*)    Creatinine, Ser 1.20 (*)    Total Protein 8.2 (*)    AST 13 (*)    All other components within normal limits  CBG MONITORING, ED - Abnormal; Notable for the following components:   Glucose-Capillary 164 (*)    All other components within normal limits  CBG MONITORING, ED - Abnormal; Notable for the following components:   Glucose-Capillary 180 (*)    All other components within normal limits  CBC WITH DIFFERENTIAL/PLATELET  LIPASE, BLOOD  URINALYSIS, ROUTINE W REFLEX MICROSCOPIC  BETA-HYDROXYBUTYRIC ACID  I-STAT BETA HCG BLOOD, ED ([facility], [facility], [facility] ONLY)    EKG None  Radiology No results found.  Procedures Procedures    Medications Ordered in ED Medications  ondansetron (ZOFRAN-ODT) disintegrating tablet 8 mg (8 mg Oral Given 04/01/22 1734)  sodium chloride 0.9 % bolus 1,000 mL (1,000 mLs Intravenous New Bag/Given 04/01/22 2007)  metoCLOPramide (REGLAN) injection 10 mg (10 mg Intravenous Given 04/01/22 2007)  pantoprazole  (PROTONIX) injection 40 mg (40 mg Intravenous Given 04/01/22 2007)  ondansetron (ZOFRAN) injection 4 mg (4 mg Intravenous Given 04/01/22 2007)    ED Course/ Medical Decision Making/ A&P                           Medical Decision Making Risk Prescription drug management.    Medical Screen Complete  This patient presented to the ED with complaint of nausea and vomiting.  This complaint involves an extensive number of treatment options. The initial differential diagnosis includes, but is not limited to, gastroparesis, hyperglycemia, metabolic abnormality, etc.  This presentation is: Acute, Chronic, Self-Limited, Previously Undiagnosed, Uncertain Prognosis, Complicated, Systemic  Symptoms, and Threat to Life/Bodily Function  Patient is presenting with complaint of nausea and vomiting.  Patient with longstanding history of diabetic gastroparesis.  Presentation today is consistent with likely recurrent episode of gastroparesis.   With IV fluids, antiemetics patient feels significantly improved.  Screening labs obtained are without significant acute abnormality.  Patient's glucose is mildly elevated at 194.  However patient's anion gap is 14.  After IV fluids and treatment in the ED the patient feels improved.  She is taking p.o. well.  She now desires DC home.  She declines additional fluids and/or additional observation.  She is requesting a work note.  Importance of close follow-up is stressed.  Strict return precautions given and understood.      Additional history obtained:  External records from outside sources obtained and reviewed including prior ED visits and prior Inpatient records.    Lab Tests:  I ordered and personally interpreted labs.  The pertinent results include: CBC, CMP, hCG, lipase, CBG   Cardiac Monitoring:  The patient was maintained on a cardiac monitor.  I personally viewed and interpreted the cardiac monitor which showed an underlying rhythm of:  NSR   Medicines ordered:  I ordered medication including IV fluids, Reglan, Zofran, Protonix for nausea, dehydration Reevaluation of the patient after these medicines showed that the patient: improved   Problem List / ED Course:  Nausea, vomiting   Reevaluation:  After the interventions noted above, I reevaluated the patient and found that they have: improved  Disposition:  After consideration of the diagnostic results and the patients response to treatment, I feel that the patent would benefit from close outpatient follow up.          Final Clinical Impression(s) / ED Diagnoses Final diagnoses:  Nausea and vomiting, unspecified vomiting type    Rx / DC Orders ED Discharge Orders     None         Wynetta Fines, MD 04/01/22 2220

## 2022-04-01 NOTE — ED Notes (Signed)
Pt given ice water.

## 2022-04-01 NOTE — ED Provider Triage Note (Signed)
Emergency Medicine Provider Triage Evaluation Note  [name] , a 30 y.o. female  was evaluated in triage.  Pt complains of epigastric abdominal pain, nausea and vomiting x 4 days.-Diabetic, states she is unable to eat or drink secondary to vomiting.  Pain is epigastric, worse with vomiting.  No diarrhea.  Patient denies any alcohol use or marijuana use.  No previous abdominal surgeries..  Review of Systems  Per HPI  Physical Exam  BP (!) 155/113 (BP Location: Left Arm)   Temp 99.3 F (37.4 C) (Oral)   Resp 18   SpO2 100%  Gen:   Awake, in no acute distress in the waiting room but back in triage began dry heaving. Resp:  Normal effort  MSK:   Moves extremities without difficulty  Other:  Abdomen soft, upper abdominal tenderness  Medical Decision Making  Medically screening exam initiated at 4:55 PM.  Appropriate orders placed.  Edison Armentor was informed that the remainder of the evaluation will be completed by another provider, this initial triage assessment does not replace that evaluation, and the importance of remaining in the ED until their evaluation is complete.  Gastroparesis versus DKA versus HHS versus cyclic vomiting.  Zofran ordered, emesis bag provided.     Theron Arista, PA-C 04/01/22 1657

## 2022-04-03 ENCOUNTER — Other Ambulatory Visit: Payer: Self-pay

## 2022-04-03 ENCOUNTER — Emergency Department (HOSPITAL_COMMUNITY)
Admission: EM | Admit: 2022-04-03 | Discharge: 2022-04-03 | Disposition: A | Discharge status: Home / Self Care | Payer: Medicaid Other | Attending: Emergency Medicine | Admitting: Emergency Medicine

## 2022-04-03 ENCOUNTER — Encounter (HOSPITAL_COMMUNITY): Payer: Self-pay | Admitting: *Deleted

## 2022-04-03 DIAGNOSIS — Z79899 Other long term (current) drug therapy: Secondary | ICD-10-CM | POA: Insufficient documentation

## 2022-04-03 DIAGNOSIS — Z7984 Long term (current) use of oral hypoglycemic drugs: Secondary | ICD-10-CM | POA: Diagnosis not present

## 2022-04-03 DIAGNOSIS — Z7951 Long term (current) use of inhaled steroids: Secondary | ICD-10-CM | POA: Diagnosis not present

## 2022-04-03 DIAGNOSIS — E1043 Type 1 diabetes mellitus with diabetic autonomic (poly)neuropathy: Secondary | ICD-10-CM | POA: Insufficient documentation

## 2022-04-03 DIAGNOSIS — I10 Essential (primary) hypertension: Secondary | ICD-10-CM | POA: Insufficient documentation

## 2022-04-03 DIAGNOSIS — K3184 Gastroparesis: Secondary | ICD-10-CM | POA: Diagnosis not present

## 2022-04-03 DIAGNOSIS — J45909 Unspecified asthma, uncomplicated: Secondary | ICD-10-CM | POA: Insufficient documentation

## 2022-04-03 DIAGNOSIS — Z1152 Encounter for screening for COVID-19: Secondary | ICD-10-CM | POA: Insufficient documentation

## 2022-04-03 DIAGNOSIS — Z794 Long term (current) use of insulin: Secondary | ICD-10-CM | POA: Insufficient documentation

## 2022-04-03 DIAGNOSIS — R109 Unspecified abdominal pain: Secondary | ICD-10-CM | POA: Diagnosis present

## 2022-04-03 DIAGNOSIS — E1143 Type 2 diabetes mellitus with diabetic autonomic (poly)neuropathy: Secondary | ICD-10-CM | POA: Diagnosis not present

## 2022-04-03 LAB — URINALYSIS, ROUTINE W REFLEX MICROSCOPIC
Bacteria, UA: NONE SEEN
Bilirubin Urine: NEGATIVE
Glucose, UA: 50 mg/dL — AB
Hgb urine dipstick: NEGATIVE
Ketones, ur: 20 mg/dL — AB
Leukocytes,Ua: NEGATIVE
Nitrite: NEGATIVE
Protein, ur: 100 mg/dL — AB
Specific Gravity, Urine: 1.016 (ref 1.005–1.030)
pH: 5 (ref 5.0–8.0)

## 2022-04-03 LAB — COMPREHENSIVE METABOLIC PANEL
ALT: 9 U/L (ref 0–44)
AST: 16 U/L (ref 15–41)
Albumin: 4.7 g/dL (ref 3.5–5.0)
Alkaline Phosphatase: 60 U/L (ref 38–126)
Anion gap: 14 (ref 5–15)
BUN: 17 mg/dL (ref 6–20)
CO2: 18 mmol/L — ABNORMAL LOW (ref 22–32)
Calcium: 10.1 mg/dL (ref 8.9–10.3)
Chloride: 105 mmol/L (ref 98–111)
Creatinine, Ser: 1.35 mg/dL — ABNORMAL HIGH (ref 0.44–1.00)
GFR, Estimated: 54 mL/min — ABNORMAL LOW (ref >60)
Glucose, Bld: 186 mg/dL — ABNORMAL HIGH (ref 70–99)
Potassium: 4.2 mmol/L (ref 3.5–5.1)
Sodium: 137 mmol/L (ref 135–145)
Total Bilirubin: 1.1 mg/dL (ref 0.3–1.2)
Total Protein: 8.3 g/dL — ABNORMAL HIGH (ref 6.5–8.1)

## 2022-04-03 LAB — CBC WITH DIFFERENTIAL/PLATELET
Abs Immature Granulocytes: 0.02 10*3/uL (ref 0.00–0.07)
Basophils Absolute: 0 10*3/uL (ref 0.0–0.1)
Basophils Relative: 1 %
Eosinophils Absolute: 0 10*3/uL (ref 0.0–0.5)
Eosinophils Relative: 0 %
HCT: 40.7 % (ref 36.0–46.0)
Hemoglobin: 13.5 g/dL (ref 12.0–15.0)
Immature Granulocytes: 0 %
Lymphocytes Relative: 28 %
Lymphs Abs: 2.2 10*3/uL (ref 0.7–4.0)
MCH: 29.9 pg (ref 26.0–34.0)
MCHC: 33.2 g/dL (ref 30.0–36.0)
MCV: 90 fL (ref 80.0–100.0)
Monocytes Absolute: 0.3 10*3/uL (ref 0.1–1.0)
Monocytes Relative: 5 %
Neutro Abs: 5.1 10*3/uL (ref 1.7–7.7)
Neutrophils Relative %: 66 %
Platelets: 291 10*3/uL (ref 150–400)
RBC: 4.52 MIL/uL (ref 3.87–5.11)
RDW: 12.5 % (ref 11.5–15.5)
WBC: 7.6 10*3/uL (ref 4.0–10.5)
nRBC: 0 % (ref 0.0–0.2)

## 2022-04-03 LAB — BLOOD GAS, VENOUS
Acid-base deficit: 1.3 mmol/L (ref 0.0–2.0)
Bicarbonate: 24.3 mmol/L (ref 20.0–28.0)
O2 Saturation: 61.2 %
Patient temperature: 38
pCO2, Ven: 45 mmHg (ref 44–60)
pH, Ven: 7.35 (ref 7.25–7.43)
pO2, Ven: 36 mmHg (ref 32–45)

## 2022-04-03 LAB — CBG MONITORING, ED
Glucose-Capillary: 190 mg/dL — ABNORMAL HIGH (ref 70–99)
Glucose-Capillary: 362 mg/dL — ABNORMAL HIGH (ref 70–99)
Glucose-Capillary: 255 mg/dL — ABNORMAL HIGH (ref 70–99)
Glucose-Capillary: 188 mg/dL — ABNORMAL HIGH (ref 70–99)

## 2022-04-03 LAB — RESP PANEL BY RT-PCR (RSV, FLU A&B, COVID)  RVPGX2
Influenza A by PCR: NEGATIVE
Influenza B by PCR: NEGATIVE
Resp Syncytial Virus by PCR: NEGATIVE
SARS Coronavirus 2 by RT PCR: NEGATIVE

## 2022-04-03 LAB — I-STAT BETA HCG BLOOD, ED (MC, WL, AP ONLY): I-stat hCG, quantitative: <5 m[IU]/mL (ref <5)

## 2022-04-03 LAB — LIPASE, BLOOD: Lipase: 21 U/L (ref 11–51)

## 2022-04-03 LAB — BETA-HYDROXYBUTYRIC ACID: Beta-Hydroxybutyric Acid: 4.59 mmol/L — ABNORMAL HIGH (ref 0.05–0.27)

## 2022-04-03 MED ORDER — METOCLOPRAMIDE HCL 10 MG PO TABS
10.0000 mg | ORAL_TABLET | Freq: Once | ORAL | Status: AC
  Administered 2022-04-03: 10 mg via ORAL
  Filled 2022-04-03: qty 1

## 2022-04-03 MED ORDER — SODIUM CHLORIDE 0.9 % IV BOLUS
1000.0000 mL | Freq: Once | INTRAVENOUS | Status: AC
  Administered 2022-04-03: 1000 mL via INTRAVENOUS

## 2022-04-03 MED ORDER — METOCLOPRAMIDE HCL 5 MG/ML IJ SOLN
10.0000 mg | Freq: Once | INTRAMUSCULAR | Status: AC
  Administered 2022-04-03: 10 mg via INTRAVENOUS
  Filled 2022-04-03: qty 2

## 2022-04-03 NOTE — ED Triage Notes (Signed)
Pt states she is Type I diabetic, Abd pain with N/V/weight loss. Unable to to keep anything down that she tries by mouth. Seen and treated Sunday for same.

## 2022-04-03 NOTE — ED Provider Notes (Signed)
[facility]-EMERGENCY DEPT Provider Note   CSN: [id number] Arrival date & time: 04/03/22  1660     History  Chief Complaint  Patient presents with   Abdominal Pain   Emesis   Nausea   Diabetes    [name] is a 31 y.o. female with history of type 1 diabetes, hypertension, diabetic gastroparesis, asthma who presents the emergency department complaining of persistent abdominal pain, nausea and vomiting.  Patient was seen the other day in the ER for similar symptoms, and she states that her symptoms returned hours after being discharged and going home.  She has been struggling to keep anything down by mouth for the past 5 days.  Recently saw her gastroenterologist who prescribed her Linzess for constipation, which she has not been able to try, no normal BM in several days.  She was also prescribed Reglan from the ER the other day, but has not been able to pick it up yet.  Denies any fevers or chills.  Has been trying to eat a bland diet including soup and crackers.   Abdominal Pain Associated symptoms: constipation, nausea and vomiting   Associated symptoms: no chills and no fever   Emesis Associated symptoms: abdominal pain   Associated symptoms: no chills and no fever   Diabetes Associated symptoms include abdominal pain.       Home Medications Prior to Admission medications   Medication Sig Start Date End Date Taking? Authorizing Provider  Albuterol Sulfate (PROAIR RESPICLICK) 108 (90 Base) MCG/ACT AEPB Inhale 1-2 puffs into the lungs every 6 (six) hours as needed. Patient taking differently: Inhale 2-3 puffs into the lungs as needed (cold symptoms). 09/22/19   Nche, Bonna Gains, NP  amLODipine (NORVASC) 5 MG tablet Take 5 mg by mouth daily. 07/13/21   [provider]  Continuous Blood Gluc Sensor (FREESTYLE LIBRE 14 DAY SENSOR) MISC by Other route every fourteen (14) days. One libre sensor every 14 days 01/21/18   [provider]   etonogestrel (NEXPLANON) 68 MG IMPL implant 1 each by Subdermal route once. Implanted summer 2020    [provider]  Insulin Aspart (NOVOLOG IJ) Inject as directed. Inject before every meal and when blood sugar spikes per insulin pump.    [provider]  Insulin Disposable Pump (OMNIPOD 5 G6 POD, GEN 5,) MISC SMARTSIG:SUB-Q Every 3 Days    [provider]  Insulin Pen Needle (ULTRA-THIN II SHORT PEN NEEDLE) 31G X 8 MM MISC Inject into the skin. 01/21/18   [provider]  lisinopril (ZESTRIL) 40 MG tablet Take 40 mg by mouth every morning. 07/13/21   [provider]  metoCLOPramide (REGLAN) 10 MG tablet Take 1 tablet (10 mg total) by mouth every 6 (six) hours. 02/28/22   Gwyneth Sprout, MD  metoCLOPramide (REGLAN) 10 MG tablet Take 1 tablet (10 mg total) by mouth every 6 (six) hours. 04/01/22   Wynetta Fines, MD  ondansetron (ZOFRAN) 4 MG tablet Take 1 tablet (4 mg total) by mouth every 6 (six) hours. 04/01/22   Wynetta Fines, MD  ondansetron (ZOFRAN-ODT) 4 MG disintegrating tablet Take 1 tablet (4 mg total) by mouth 2 (two) times daily as needed for nausea or vomiting. 12/04/21   Leatha Gilding, MD  pantoprazole (PROTONIX) 40 MG tablet Take 1 tablet (40 mg total) by mouth daily as needed (heart burn). Patient taking differently: Take 40 mg by mouth daily. 08/05/21   Rolly Salter, MD  PARoxetine (PAXIL) 20 MG  tablet Take 20 mg by mouth daily. 07/13/21   [provider]  polyethylene glycol powder (GLYCOLAX/MIRALAX) 17 GM/SCOOP powder Take 17 g by mouth daily. Patient not taking: Reported on 09/04/2021 09/22/19   Nche, Bonna Gains, NP  promethazine (PHENERGAN) 25 MG tablet Take 25 mg by mouth every 8 (eight) hours as needed for nausea or vomiting.    [provider]  promethazine (PHENERGAN) 25 MG tablet Take 1 tablet (25 mg total) by mouth every 6 (six) hours as needed for nausea or vomiting. 04/01/22   Wynetta Fines, MD   ranitidine (ZANTAC) 75 MG tablet Take 75 mg by mouth daily as needed for heartburn.  06/15/20  [provider]      Allergies    Aleve [naproxen sodium]    Review of Systems   Review of Systems  Constitutional:  Negative for chills and fever.  Gastrointestinal:  Positive for abdominal pain, constipation, nausea and vomiting.  All other systems reviewed and are negative.   Physical Exam Updated Vital Signs BP (!) 135/114   Pulse 96   Temp 98.8 F (37.1 C)   Resp 12   Ht 5\' 4"  (1.626 m)   SpO2 100%   BMI 19.57 kg/m  Physical Exam Vitals and nursing note reviewed.  Constitutional:      Appearance: Normal appearance.  HENT:     Head: Normocephalic and atraumatic.  Eyes:     Conjunctiva/sclera: Conjunctivae normal.  Cardiovascular:     Rate and Rhythm: Normal rate and regular rhythm.  Pulmonary:     Effort: Pulmonary effort is normal. No respiratory distress.     Breath sounds: Normal breath sounds.  Abdominal:     General: There is no distension.     Palpations: Abdomen is soft.     Tenderness: There is no abdominal tenderness.  Skin:    General: Skin is warm and dry.  Neurological:     General: No focal deficit present.     Mental Status: She is alert.     ED Results / Procedures / Treatments   Labs (all labs ordered are listed, but only abnormal results are displayed) Labs Reviewed  COMPREHENSIVE METABOLIC PANEL - Abnormal; Notable for the following components:      Result Value   CO2 18 (*)    Glucose, Bld 186 (*)    Creatinine, Ser 1.35 (*)    Total Protein 8.3 (*)    GFR, Estimated 54 (*)    All other components within normal limits  BETA-HYDROXYBUTYRIC ACID - Abnormal; Notable for the following components:   Beta-Hydroxybutyric Acid 4.59 (*)    All other components within normal limits  URINALYSIS, ROUTINE W REFLEX MICROSCOPIC - Abnormal; Notable for the following components:   Glucose, UA 50 (*)    Ketones, ur 20 (*)    Protein, ur 100  (*)    All other components within normal limits  CBG MONITORING, ED - Abnormal; Notable for the following components:   Glucose-Capillary 190 (*)    All other components within normal limits  CBG MONITORING, ED - Abnormal; Notable for the following components:   Glucose-Capillary 362 (*)    All other components within normal limits  CBG MONITORING, ED - Abnormal; Notable for the following components:   Glucose-Capillary 255 (*)    All other components within normal limits  CBG MONITORING, ED - Abnormal; Notable for the following components:   Glucose-Capillary 188 (*)    All other components within normal limits  RESP PANEL BY RT-PCR (RSV, FLU A&B, COVID)  RVPGX2  CBC WITH DIFFERENTIAL/PLATELET  LIPASE, BLOOD  BLOOD GAS, VENOUS  I-STAT BETA HCG BLOOD, ED ([facility], [facility], [facility] ONLY)    EKG None  Radiology No results found.  Procedures Procedures    Medications Ordered in ED Medications  metoCLOPramide (REGLAN) tablet 10 mg (has no administration in time range)  sodium chloride 0.9 % bolus 1,000 mL (0 mLs Intravenous Stopped 04/03/22 2043)  metoCLOPramide (REGLAN) injection 10 mg (10 mg Intravenous Given 04/03/22 1923)    ED Course/ Medical Decision Making/ A&P                           Medical Decision Making Amount and/or Complexity of Data Reviewed Labs: ordered.  Risk Prescription drug management.  This patient is a 31 y.o. female  who presents to the ED for concern of abdominal pain, nausea and vomiting.   Differential diagnoses prior to evaluation: The emergent differential diagnosis includes, but is not limited to,  AAA, mesenteric ischemia, appendicitis, diverticulitis, DKA, gastroenteritis, nephrolithiasis, pancreatitis, constipation, UTI, bowel obstruction, biliary disease, IBD, PUD, hepatitis, ectopic pregnancy, ovarian torsion, PID. This is not an exhaustive differential.   Past Medical History / Co-morbidities: type 1 diabetes, hypertension, diabetic  gastroparesis, asthma  Additional history: Chart reviewed. Pertinent results include: Patient seen on 12/17 for similar symptoms.  Have reassuring workup and improvement of symptoms with IV fluids and Reglan.  Discharged in stable condition.  Prior to that, was last seen on 11/15 for similar symptoms.  Physical Exam: Physical exam performed. The pertinent findings include: Mildly tachycardic, suspect likely due to dehydration.  No chest pain or shortness of breath.  Afebrile.  Abdomen soft, nontender.  Lab Tests/Imaging studies: I personally interpreted labs/imaging and the pertinent results include: No leukocytosis, normal hemoglobin.  Highest recorded glucose while in the ER 362. Creatinine 1.35, mildly elevated compared to prior.  Electrolytes otherwise grossly unremarkable.  Anion gap 14.  Beta hydroxybutyrate 4.59, suspect likely starvation ketosis.  Venous blood gas normal.  Urinalysis with ketones and protein.  Respiratory panel negative for COVID and flu.  Cardiac monitoring: EKG obtained and interpreted by my attending physician which shows: sinus rhythm   Medications: I ordered medication including IV fluids and reglan.  I have reviewed the patients home medicines and have made adjustments as needed.  On reevaluation patient states that her nausea has subsided, and her pain has resolved.  She passed a p.o. challenge while in the department.   Disposition: After consideration of the diagnostic results and the patients response to treatment, I feel that emergency department workup does not suggest an emergent condition requiring admission or immediate intervention beyond what has been performed at this time. The plan is: Discharge to home with instructions to prioritize Reglan as her antiemetic.  She was not having success with her other p.o. meds or Compazine suppositories.  She had resolution of her symptoms in the ER numerous times with Reglan, and I think this is likely will be best  for her gastroparesis.  Recommended she follow-up with her gastroenterologist and primary care as needed.  Overall abdominal exam is nonsurgical, and lab work grossly unremarkable.  Not in DKA/HHS.. The patient is safe for discharge and has been instructed to return immediately for worsening symptoms, change in symptoms or any other concerns.  Final Clinical Impression(s) / ED Diagnoses Final diagnoses:  Diabetic gastroparesis associated with type 1 diabetes mellitus (HCC)  Rx / DC Orders ED Discharge Orders     None      Portions of this report may have been transcribed using voice recognition software. Every effort was made to ensure accuracy; however, inadvertent computerized transcription errors may be present.    Jeanella FlatteryRoemhildt, Kara Mierzejewski T, PA-C 04/03/22 2107    Derwood KaplanNanavati, Ankit, MD 04/04/22 2248

## 2022-04-03 NOTE — ED Provider Triage Note (Signed)
Emergency Medicine Provider Triage Evaluation Note  [name] , a 31 y.o. female  was evaluated in triage.  Pt complains of abdominal pain nausea vomiting.  Patient reports a history of type 1 diabetes, gastroparesis.  Patient presents for abdominal pain ongoing x 4 days, no clear inciting event.  She reports she was seen in the ER for the same 2 days ago.  She reports this feels very similar to prior episodes of gastroparesis her abdominal pain is upper aching/burning, worse with emesis improves with rest.  Review of Systems  Positive: Abdominal pain, nonbloody/nonbilious emesis Negative: Fever, chills, chest pain, dysuria/hematuria, lower abdominal pain, THC use  Physical Exam  BP (!) 128/98   Pulse (!) 102   Resp 18   Ht 5\' 4"  (1.626 m)   SpO2 100%   BMI 19.57 kg/m  Gen:   Awake, no distress   Resp:  Normal effort  MSK:   Moves extremities without difficulty  Other:  Abd soft. Mild TTP of the epigastrium.  No rebound or guarding.  Medical Decision Making  Medically screening exam initiated at 8:15 AM.  Appropriate orders placed.  Janisha Mcmeekin was informed that the remainder of the evaluation will be completed by another provider, this initial triage assessment does not replace that evaluation, and the importance of remaining in the ED until their evaluation is complete.     Note: Portions of this report may have been transcribed using voice recognition software. Every effort was made to ensure accuracy; however, inadvertent computerized transcription errors may still be present.    Julian Reil, Bill Salinas 04/03/22 [phone]

## 2022-04-03 NOTE — Discharge Instructions (Signed)
You were seen in the emergency department today for abdominal pain, nausea and vomiting.  As we discussed I think your symptoms are likely related to gastroparesis.  I am glad that you improved with the Reglan medicine we gave you.  Please pick up your prescription tomorrow, but in the interim I have given you a pill to take at home.  This is a medicine you can take every 6 hours as needed, your last dose in ER was at 7 PM.  Continue to monitor how you're doing and return to the ER for new or worsening symptoms.

## 2022-04-04 ENCOUNTER — Telehealth: Payer: Self-pay

## 2022-04-04 NOTE — Patient Outreach (Signed)
Transition Care Management Unsuccessful Follow-up Telephone Call  Date of discharge and from where:  04/03/22 [facility]  Attempts:  1st Attempt  Reason for unsuccessful TCM follow-up call:  Left voice message  Gus Puma, BSW, [facility]  [id number]  High Risk Managed Medicaid Team  [phone]

## 2022-04-13 DIAGNOSIS — K828 Other specified diseases of gallbladder: Secondary | ICD-10-CM | POA: Diagnosis not present

## 2022-04-13 DIAGNOSIS — K219 Gastro-esophageal reflux disease without esophagitis: Secondary | ICD-10-CM | POA: Diagnosis not present

## 2022-04-13 DIAGNOSIS — R112 Nausea with vomiting, unspecified: Secondary | ICD-10-CM | POA: Diagnosis not present

## 2022-04-13 DIAGNOSIS — Z79899 Other long term (current) drug therapy: Secondary | ICD-10-CM | POA: Diagnosis not present

## 2022-04-13 DIAGNOSIS — R1114 Bilious vomiting: Secondary | ICD-10-CM | POA: Diagnosis not present

## 2022-04-13 DIAGNOSIS — K8067 Calculus of gallbladder and bile duct with acute and chronic cholecystitis with obstruction: Secondary | ICD-10-CM | POA: Diagnosis not present

## 2022-04-13 DIAGNOSIS — R1115 Cyclical vomiting syndrome unrelated to migraine: Secondary | ICD-10-CM | POA: Diagnosis not present

## 2022-04-13 DIAGNOSIS — R1013 Epigastric pain: Secondary | ICD-10-CM | POA: Diagnosis not present

## 2022-04-13 DIAGNOSIS — R6881 Early satiety: Secondary | ICD-10-CM | POA: Diagnosis not present

## 2022-04-13 DIAGNOSIS — K5909 Other constipation: Secondary | ICD-10-CM | POA: Diagnosis not present

## 2022-04-13 DIAGNOSIS — E109 Type 1 diabetes mellitus without complications: Secondary | ICD-10-CM | POA: Diagnosis not present

## 2022-04-14 ENCOUNTER — Emergency Department (HOSPITAL_COMMUNITY)
Admission: EM | Admit: 2022-04-14 | Discharge: 2022-04-14 | Discharge status: Against Medical Advice | Payer: Medicaid Other

## 2022-04-14 DIAGNOSIS — Z79899 Other long term (current) drug therapy: Secondary | ICD-10-CM | POA: Diagnosis not present

## 2022-04-14 DIAGNOSIS — Z794 Long term (current) use of insulin: Secondary | ICD-10-CM | POA: Diagnosis not present

## 2022-04-14 DIAGNOSIS — E1065 Type 1 diabetes mellitus with hyperglycemia: Secondary | ICD-10-CM | POA: Diagnosis not present

## 2022-04-14 DIAGNOSIS — Z886 Allergy status to analgesic agent status: Secondary | ICD-10-CM | POA: Diagnosis not present

## 2022-04-14 DIAGNOSIS — E1010 Type 1 diabetes mellitus with ketoacidosis without coma: Secondary | ICD-10-CM | POA: Diagnosis not present

## 2022-04-14 DIAGNOSIS — E1043 Type 1 diabetes mellitus with diabetic autonomic (poly)neuropathy: Secondary | ICD-10-CM | POA: Diagnosis not present

## 2022-04-14 DIAGNOSIS — K219 Gastro-esophageal reflux disease without esophagitis: Secondary | ICD-10-CM | POA: Diagnosis not present

## 2022-04-14 DIAGNOSIS — K3184 Gastroparesis: Secondary | ICD-10-CM | POA: Diagnosis not present

## 2022-04-14 DIAGNOSIS — F17210 Nicotine dependence, cigarettes, uncomplicated: Secondary | ICD-10-CM | POA: Diagnosis not present

## 2022-04-14 DIAGNOSIS — R1084 Generalized abdominal pain: Secondary | ICD-10-CM | POA: Diagnosis not present

## 2022-04-14 DIAGNOSIS — I10 Essential (primary) hypertension: Secondary | ICD-10-CM | POA: Diagnosis not present

## 2022-04-14 NOTE — ED Notes (Signed)
No answer from pt when called for room x 3.  

## 2022-04-14 NOTE — ED Notes (Signed)
No answer when called for triage x1 

## 2022-04-14 NOTE — ED Notes (Signed)
No answer from pt when called for room x 2.  

## 2022-04-15 DIAGNOSIS — K3184 Gastroparesis: Secondary | ICD-10-CM | POA: Diagnosis not present

## 2022-04-21 IMAGING — CT CT ABD-PELV W/ CM
2 of 4 series · 16 of 46 positions shown, 18 images · IV contrast (agent unspecified)
Comparison: None.

CLINICAL DATA: Epigastric pain

EXAM:
CT ABDOMEN AND PELVIS WITH CONTRAST
TECHNIQUE: Multidetector CT imaging of the abdomen and pelvis was performed
using the standard protocol following bolus administration of
intravenous contrast.

[Series 2: axial st · axial · 0.83mm/px · z∈[+1072,+1447]mm · 13 of 85 slices shown, 15 images]
[im 5/85  soft-tissue]
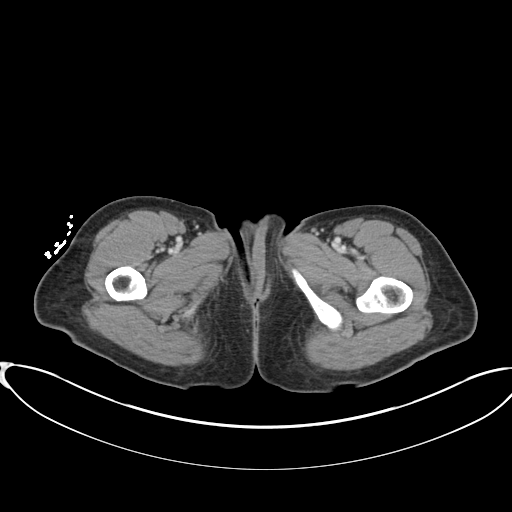
[im 5/85  bone]
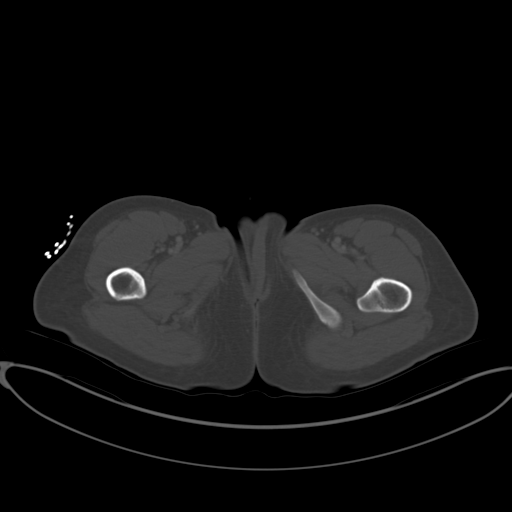
[im 14/85  soft-tissue]
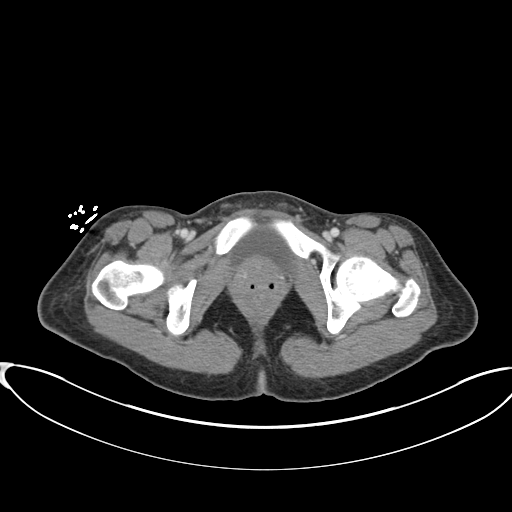
[im 18/85  soft-tissue]
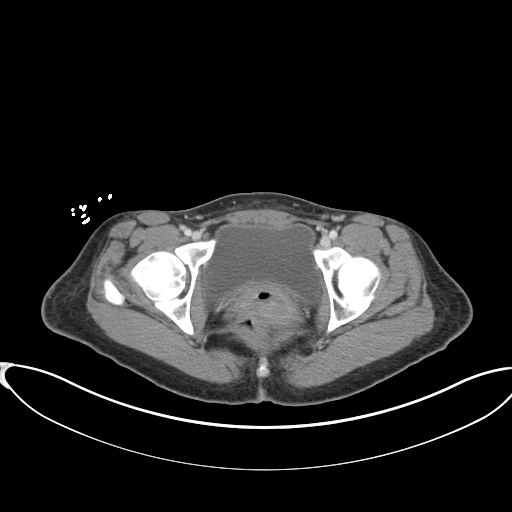
[im 23/85  soft-tissue]
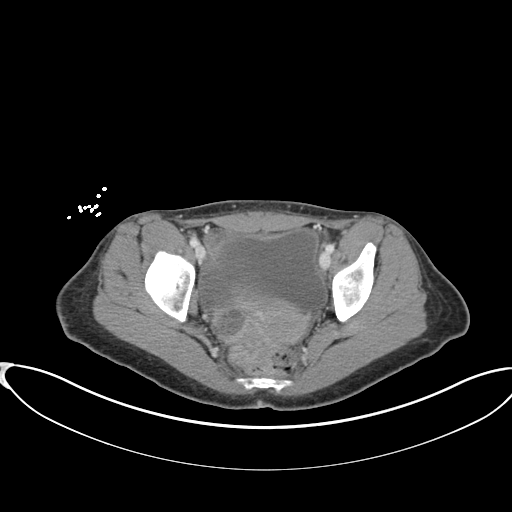
[im 31/85  soft-tissue]
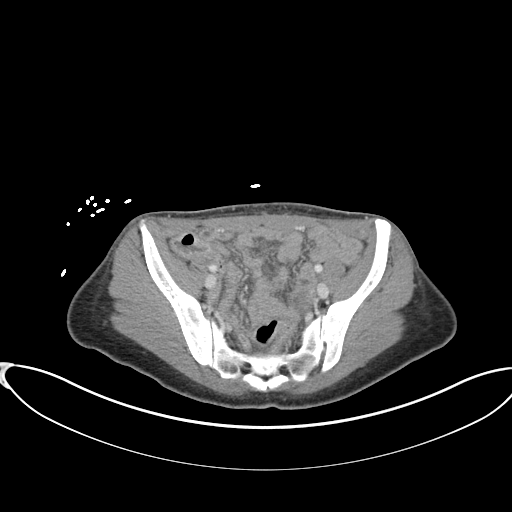
[im 36/85  soft-tissue]
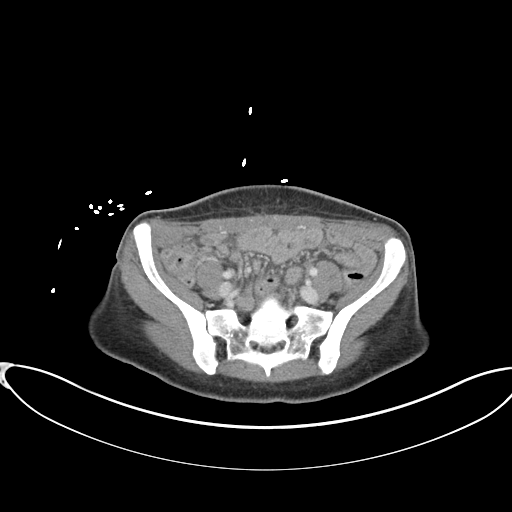
[im 45/85  soft-tissue]
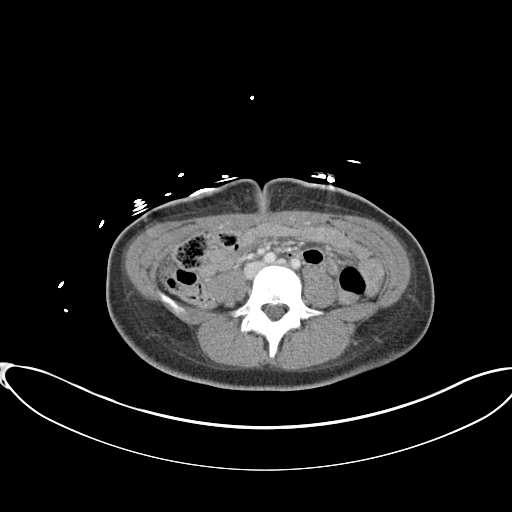
[im 49/85  soft-tissue]
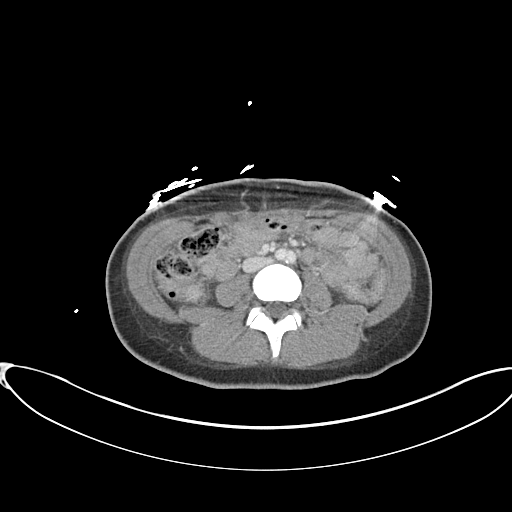
[im 54/85  soft-tissue]
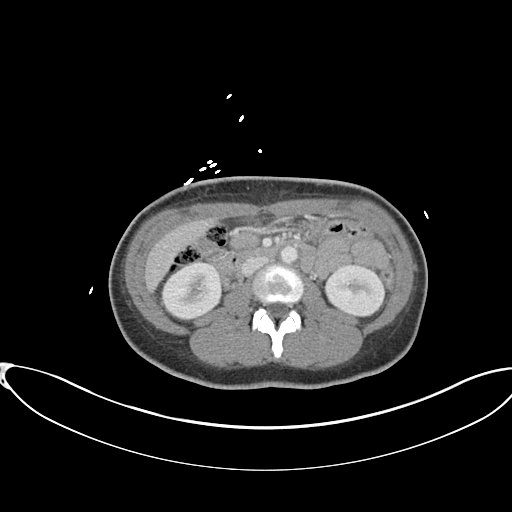
[im 54/85  bone]
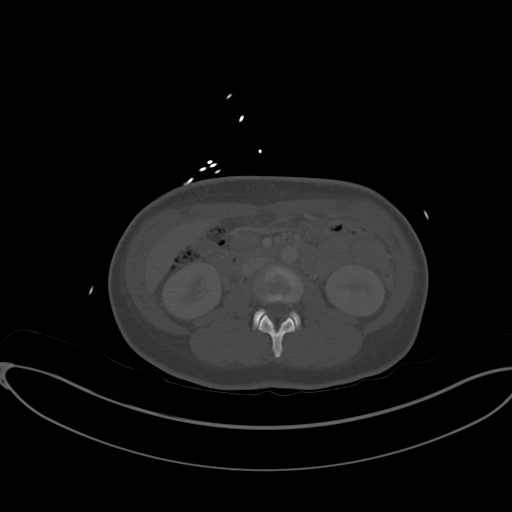
[im 62/85  soft-tissue]
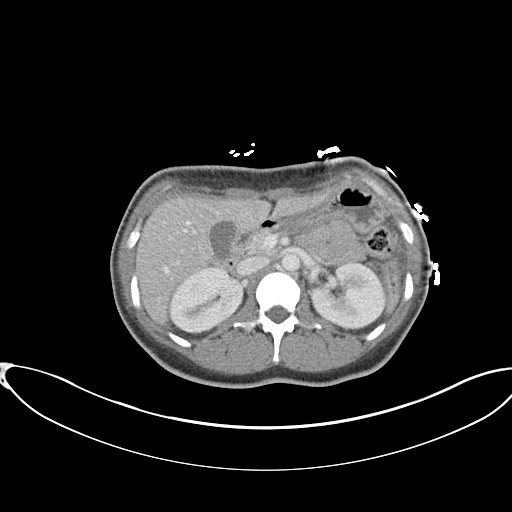
[im 67/85  soft-tissue]
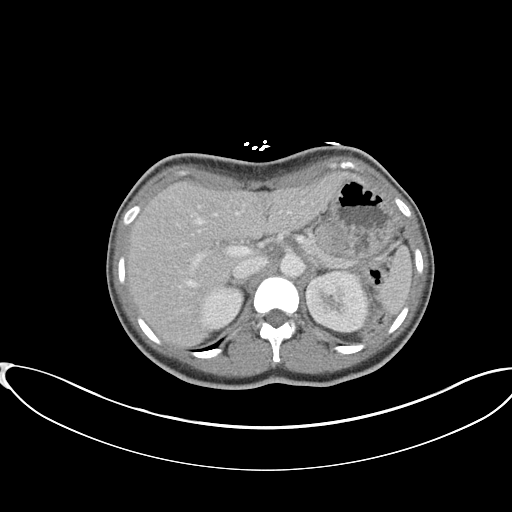
[im 71/85  soft-tissue]
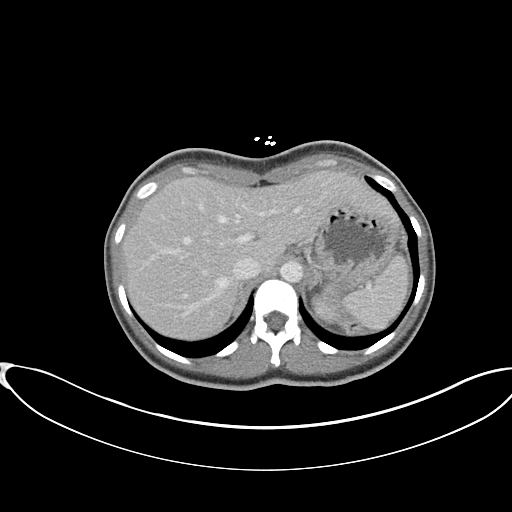
[im 80/85  soft-tissue]
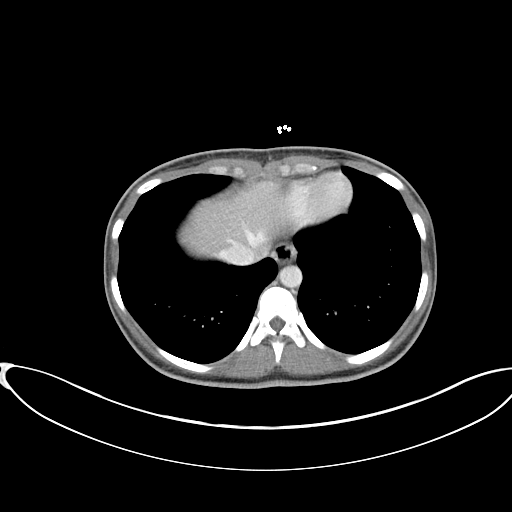

[Series 4: coronal st · coronal · 0.73mm/px · 3 of 117 slices shown]
[im 39/117  soft-tissue]
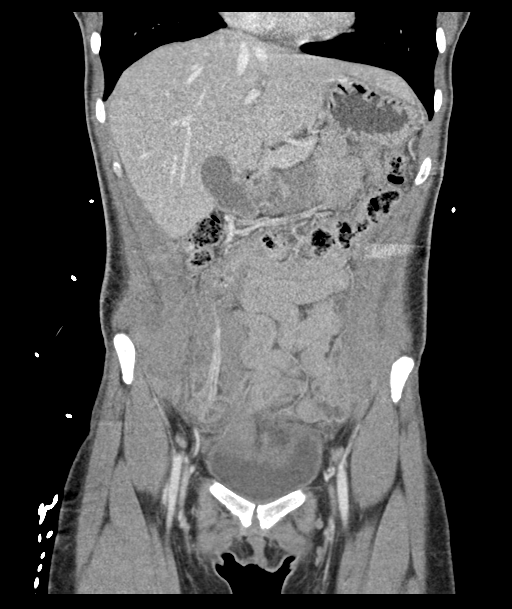
[im 52/117  soft-tissue]
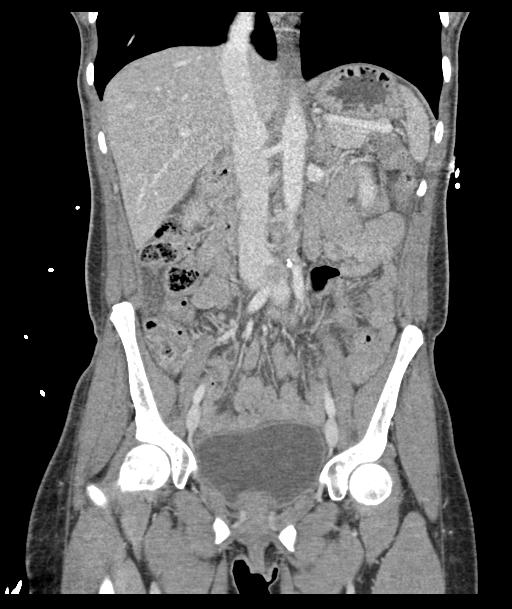
[im 65/117  soft-tissue]
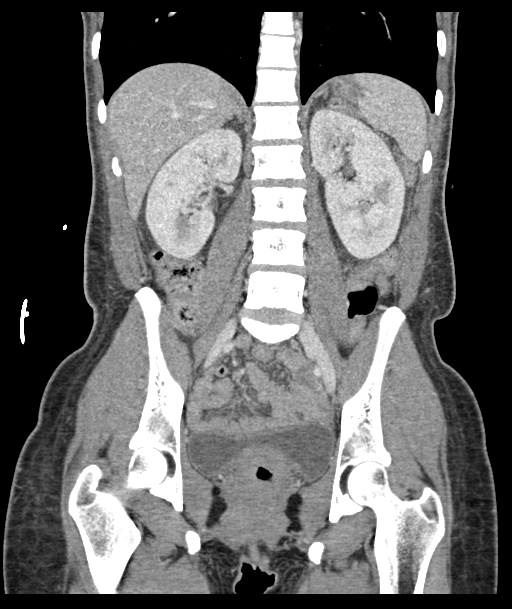

[16 of 46 positions shown; findings below may reference images not displayed]

RADIATION DOSE REDUCTION: This exam was performed according to the
departmental dose-optimization program which includes automated
exposure control, adjustment of the mA and/or kV according to
patient size and/or use of iterative reconstruction technique.

CONTRAST:  100mL OMNIPAQUE IOHEXOL 300 MG/ML  SOLN
FINDINGS: Lower Chest: Normal.

Hepatobiliary: Normal hepatic contours. No intra- or extrahepatic
biliary dilatation. The gallbladder is normal.

Pancreas: Normal pancreas. No ductal dilatation or peripancreatic
fluid collection.

Spleen: Normal.

Adrenals/Urinary Tract: The adrenal glands are normal. No
hydronephrosis, nephroureterolithiasis or solid renal mass. The
urinary bladder is normal for degree of distention

Stomach/Bowel: There is no hiatal hernia. Normal duodenal course and
caliber. No small bowel dilatation or inflammation. No focal colonic
abnormality. Normal appendix.

Vascular/Lymphatic: Normal course and caliber of the major abdominal
vessels. No abdominal or pelvic lymphadenopathy.

Reproductive: Normal uterus. No adnexal mass.

Other: None.

Musculoskeletal: No bony spinal canal stenosis or focal osseous
abnormality.
IMPRESSION: No acute abnormality of the abdomen or pelvis.

## 2022-04-24 DIAGNOSIS — R634 Abnormal weight loss: Secondary | ICD-10-CM | POA: Diagnosis not present

## 2022-04-24 DIAGNOSIS — R112 Nausea with vomiting, unspecified: Secondary | ICD-10-CM | POA: Diagnosis not present

## 2022-05-03 ENCOUNTER — Telehealth: Payer: Self-pay

## 2022-05-03 NOTE — Telephone Encounter (Signed)
..  Medicaid Managed Care Note  05/03/2022 Name: [name] MRN: [id number] DOB: May 11, 1990  [name] is a 32 y.o. year old female who is a primary care patient of Pcp, No and is actively engaged with the care management team. I reached out to [name] by phone today to assist with scheduling an initial visit with the RN Case Manager  Follow up plan: Unsuccessful telephone outreach attempt made. A HIPAA compliant phone message was left for the patient providing contact information and requesting a return call.  The care management team will reach out to the patient again over the next 7 days.   [place]

## 2022-05-14 DIAGNOSIS — R1114 Bilious vomiting: Secondary | ICD-10-CM | POA: Diagnosis not present

## 2022-05-14 DIAGNOSIS — R1115 Cyclical vomiting syndrome unrelated to migraine: Secondary | ICD-10-CM | POA: Diagnosis not present

## 2022-05-25 ENCOUNTER — Telehealth: Payer: Self-pay

## 2022-05-25 NOTE — Progress Notes (Signed)
..   Medicaid Managed Care   Unsuccessful Outreach Note  05/25/2022 Name: [name] MRN: [id number] DOB: [birth year]  Referred by: Pcp, No Reason for referral : Appointment   A second unsuccessful telephone outreach was attempted today. The patient was referred to the case management team for assistance with care management and care coordination.   Follow Up Plan: The care management team will reach out to the patient again over the next 14 days.   [place]

## 2022-06-04 DIAGNOSIS — R1115 Cyclical vomiting syndrome unrelated to migraine: Secondary | ICD-10-CM | POA: Diagnosis not present

## 2022-06-04 DIAGNOSIS — K828 Other specified diseases of gallbladder: Secondary | ICD-10-CM | POA: Diagnosis not present

## 2022-06-29 DIAGNOSIS — K811 Chronic cholecystitis: Secondary | ICD-10-CM | POA: Diagnosis not present

## 2022-06-29 DIAGNOSIS — K828 Other specified diseases of gallbladder: Secondary | ICD-10-CM | POA: Diagnosis not present

## 2022-07-10 ENCOUNTER — Telehealth: Payer: Self-pay

## 2022-07-10 NOTE — Telephone Encounter (Signed)
I called the patient today to get her scheduled with the MM Care Management Team. Patient is recovering from gall bladder surgery and she asked if I could call her back in a few days since she was not feeling very well. We agreed I would call her back this Friday.  [place]  [phone]

## 2022-07-18 ENCOUNTER — Telehealth: Payer: Self-pay

## 2022-07-18 NOTE — Telephone Encounter (Signed)
..   Medicaid Managed Care   Unsuccessful Outreach Note  07/18/2022 Name: [name] MRN: [id number] DOB: 1990-12-22  Referred by: Pcp, No Reason for referral : Appointment   A second unsuccessful telephone outreach was attempted today. The patient was referred to the case management team for assistance with care management and care coordination.   Follow Up Plan: A HIPAA compliant phone message was left for the patient providing contact information and requesting a return call.  The care management team will reach out to the patient again over the next 7 days.    [place]  [phone]

## 2022-07-23 ENCOUNTER — Telehealth: Payer: Self-pay

## 2022-07-23 NOTE — Telephone Encounter (Signed)
..   Medicaid Managed Care   Unsuccessful Outreach Note  07/23/2022 Name: [name] MRN: [id number] DOB: [birth year]  Referred by: Pcp, No Reason for referral : Appointment   Third unsuccessful telephone outreach was attempted today. The patient was referred to the case management team for assistance with care management and care coordination. The patient's primary care provider has been notified of our unsuccessful attempts to make or maintain contact with the patient. The care management team is pleased to engage with this patient at any time in the future should he/she be interested in assistance from the care management team.   Follow Up Plan: We have been unable to make contact with the patient for follow up. The care management team is available to follow up with the patient after provider conversation with the patient regarding recommendation for care management engagement and subsequent re-referral to the care management team.   Weston Settle Care Guide  [facility] Managed  [facility]  [phone]

## 2022-07-30 DIAGNOSIS — K828 Other specified diseases of gallbladder: Secondary | ICD-10-CM | POA: Diagnosis not present

## 2022-10-01 ENCOUNTER — Telehealth: Payer: Self-pay | Admitting: *Deleted

## 2022-10-01 NOTE — Transitions of Care (Post Inpatient/ED Visit) (Signed)
   10/01/2022  Name: [name] MRN: [id number] DOB: November 02, 1990  Today's TOC FU Call Status: Today's TOC FU Call Status:: Unsuccessul Call (1st Attempt) Unsuccessful Call (1st Attempt) Date: 10/01/22  Attempted to reach the patient regarding the most recent Inpatient/ED visit.  Follow Up Plan: Additional outreach attempts will be made to reach the patient to complete the Transitions of Care (Post Inpatient/ED visit) call.   Estanislado Emms RN, BSN [place]  Managed [facility] RN Care Coordinator [phone]

## 2022-10-05 ENCOUNTER — Telehealth: Payer: Self-pay | Admitting: *Deleted

## 2022-10-05 NOTE — Transitions of Care (Post Inpatient/ED Visit) (Signed)
   10/05/2022  Name: [name] MRN: [id number] DOB: Sep 07, 1990  Today's TOC FU Call Status: Today's TOC FU Call Status:: Unsuccessul Call (1st Attempt) Unsuccessful Call (1st Attempt) Date: 10/05/22  Attempted to reach the patient regarding the most recent Inpatient/ED visit.  Follow Up Plan: Additional outreach attempts will be made to reach the patient to complete the Transitions of Care (Post Inpatient/ED visit) call.   Estanislado Emms RN, BSN [place]  Managed [facility] RN Care Coordinator [phone]

## 2022-10-17 ENCOUNTER — Emergency Department (HOSPITAL_COMMUNITY)
Admission: EM | Admit: 2022-10-17 | Discharge: 2022-10-17 | Disposition: A | Discharge status: Home / Self Care | Payer: 59 | Attending: Emergency Medicine | Admitting: Emergency Medicine

## 2022-10-17 ENCOUNTER — Other Ambulatory Visit: Payer: Self-pay

## 2022-10-17 ENCOUNTER — Encounter (HOSPITAL_COMMUNITY): Payer: Self-pay

## 2022-10-17 DIAGNOSIS — K3184 Gastroparesis: Secondary | ICD-10-CM

## 2022-10-17 DIAGNOSIS — R112 Nausea with vomiting, unspecified: Secondary | ICD-10-CM | POA: Insufficient documentation

## 2022-10-17 DIAGNOSIS — E109 Type 1 diabetes mellitus without complications: Secondary | ICD-10-CM | POA: Diagnosis not present

## 2022-10-17 DIAGNOSIS — R000 Tachycardia, unspecified: Secondary | ICD-10-CM | POA: Diagnosis not present

## 2022-10-17 DIAGNOSIS — Z794 Long term (current) use of insulin: Secondary | ICD-10-CM | POA: Diagnosis not present

## 2022-10-17 DIAGNOSIS — Z79899 Other long term (current) drug therapy: Secondary | ICD-10-CM | POA: Insufficient documentation

## 2022-10-17 DIAGNOSIS — I10 Essential (primary) hypertension: Secondary | ICD-10-CM | POA: Diagnosis not present

## 2022-10-17 DIAGNOSIS — R1013 Epigastric pain: Secondary | ICD-10-CM | POA: Insufficient documentation

## 2022-10-17 DIAGNOSIS — E860 Dehydration: Secondary | ICD-10-CM

## 2022-10-17 LAB — COMPREHENSIVE METABOLIC PANEL
ALT: 10 U/L (ref 0–44)
AST: 13 U/L — ABNORMAL LOW (ref 15–41)
Albumin: 4.2 g/dL (ref 3.5–5.0)
Alkaline Phosphatase: 66 U/L (ref 38–126)
Anion gap: 13 (ref 5–15)
BUN: 20 mg/dL (ref 6–20)
CO2: 20 mmol/L — ABNORMAL LOW (ref 22–32)
Calcium: 9.6 mg/dL (ref 8.9–10.3)
Chloride: 104 mmol/L (ref 98–111)
Creatinine, Ser: 1.31 mg/dL — ABNORMAL HIGH (ref 0.44–1.00)
GFR, Estimated: 56 mL/min — ABNORMAL LOW (ref >60)
Glucose, Bld: 194 mg/dL — ABNORMAL HIGH (ref 70–99)
Potassium: 3.9 mmol/L (ref 3.5–5.1)
Sodium: 137 mmol/L (ref 135–145)
Total Bilirubin: 0.7 mg/dL (ref 0.3–1.2)
Total Protein: 7.7 g/dL (ref 6.5–8.1)

## 2022-10-17 LAB — URINALYSIS, ROUTINE W REFLEX MICROSCOPIC
Bilirubin Urine: NEGATIVE
Glucose, UA: NEGATIVE mg/dL
Ketones, ur: 20 mg/dL — AB
Leukocytes,Ua: NEGATIVE
Nitrite: NEGATIVE
Protein, ur: 100 mg/dL — AB
RBC / HPF: >50 RBC/hpf (ref 0–5)
Specific Gravity, Urine: 1.014 (ref 1.005–1.030)
pH: 6 (ref 5.0–8.0)

## 2022-10-17 LAB — BASIC METABOLIC PANEL
Anion gap: 7 (ref 5–15)
BUN: 18 mg/dL (ref 6–20)
CO2: 21 mmol/L — ABNORMAL LOW (ref 22–32)
Calcium: 8.6 mg/dL — ABNORMAL LOW (ref 8.9–10.3)
Chloride: 110 mmol/L (ref 98–111)
Creatinine, Ser: 1.11 mg/dL — ABNORMAL HIGH (ref 0.44–1.00)
GFR, Estimated: >60 mL/min (ref >60)
Glucose, Bld: 158 mg/dL — ABNORMAL HIGH (ref 70–99)
Potassium: 3.9 mmol/L (ref 3.5–5.1)
Sodium: 138 mmol/L (ref 135–145)

## 2022-10-17 LAB — CBC WITH DIFFERENTIAL/PLATELET
Abs Immature Granulocytes: 0.03 10*3/uL (ref 0.00–0.07)
Basophils Absolute: 0.1 10*3/uL (ref 0.0–0.1)
Basophils Relative: 1 %
Eosinophils Absolute: 0.1 10*3/uL (ref 0.0–0.5)
Eosinophils Relative: 1 %
HCT: 46.2 % — ABNORMAL HIGH (ref 36.0–46.0)
Hemoglobin: 14.7 g/dL (ref 12.0–15.0)
Immature Granulocytes: 0 %
Lymphocytes Relative: 25 %
Lymphs Abs: 2.1 10*3/uL (ref 0.7–4.0)
MCH: 30.4 pg (ref 26.0–34.0)
MCHC: 31.8 g/dL (ref 30.0–36.0)
MCV: 95.5 fL (ref 80.0–100.0)
Monocytes Absolute: 0.5 10*3/uL (ref 0.1–1.0)
Monocytes Relative: 6 %
Neutro Abs: 5.6 10*3/uL (ref 1.7–7.7)
Neutrophils Relative %: 67 %
Platelets: 207 10*3/uL (ref 150–400)
RBC: 4.84 MIL/uL (ref 3.87–5.11)
RDW: 13.2 % (ref 11.5–15.5)
WBC: 8.4 10*3/uL (ref 4.0–10.5)
nRBC: 0 % (ref 0.0–0.2)

## 2022-10-17 LAB — BLOOD GAS, VENOUS
Acid-Base Excess: 0.7 mmol/L (ref 0.0–2.0)
Bicarbonate: 27.8 mmol/L (ref 20.0–28.0)
O2 Saturation: 64 %
Patient temperature: 37
pCO2, Ven: 54 mmHg (ref 44–60)
pH, Ven: 7.32 (ref 7.25–7.43)
pO2, Ven: 38 mmHg (ref 32–45)

## 2022-10-17 LAB — LIPASE, BLOOD: Lipase: 22 U/L (ref 11–51)

## 2022-10-17 LAB — BETA-HYDROXYBUTYRIC ACID
Beta-Hydroxybutyric Acid: 1.42 mmol/L — ABNORMAL HIGH (ref 0.05–0.27)
Beta-Hydroxybutyric Acid: 0.64 mmol/L — ABNORMAL HIGH (ref 0.05–0.27)

## 2022-10-17 LAB — PREGNANCY, URINE: Preg Test, Ur: NEGATIVE

## 2022-10-17 LAB — CBG MONITORING, ED: Glucose-Capillary: 191 mg/dL — ABNORMAL HIGH (ref 70–99)

## 2022-10-17 MED ORDER — SODIUM CHLORIDE 0.9 % IV BOLUS
1000.0000 mL | Freq: Once | INTRAVENOUS | Status: AC
  Administered 2022-10-17: 1000 mL via INTRAVENOUS

## 2022-10-17 MED ORDER — OXYCODONE-ACETAMINOPHEN 5-325 MG PO TABS: 1.0000 | ORAL_TABLET | Freq: Four times a day (QID) | ORAL | 0 refills | Status: DC | PRN

## 2022-10-17 MED ORDER — ALUM & MAG HYDROXIDE-SIMETH 200-200-20 MG/5ML PO SUSP
30.0000 mL | Freq: Once | ORAL | Status: AC
  Administered 2022-10-17: 30 mL via ORAL
  Filled 2022-10-17: qty 30

## 2022-10-17 MED ORDER — TAMSULOSIN HCL 0.4 MG PO CAPS: 0.4000 mg | ORAL_CAPSULE | Freq: Every day | ORAL | 0 refills | Status: DC

## 2022-10-17 MED ORDER — FAMOTIDINE IN NACL 20-0.9 MG/50ML-% IV SOLN
20.0000 mg | Freq: Once | INTRAVENOUS | Status: AC
  Administered 2022-10-17: 20 mg via INTRAVENOUS
  Filled 2022-10-17: qty 50

## 2022-10-17 MED ORDER — MORPHINE SULFATE (PF) 4 MG/ML IV SOLN
4.0000 mg | Freq: Once | INTRAVENOUS | Status: AC
  Administered 2022-10-17: 4 mg via INTRAVENOUS
  Filled 2022-10-17: qty 1

## 2022-10-17 MED ORDER — ONDANSETRON HCL 4 MG PO TABS: 4.0000 mg | ORAL_TABLET | Freq: Four times a day (QID) | ORAL | 0 refills | Status: DC

## 2022-10-17 MED ORDER — ONDANSETRON 4 MG PO TBDP: 4.0000 mg | ORAL_TABLET | Freq: Two times a day (BID) | ORAL | 0 refills | Status: DC | PRN

## 2022-10-17 MED ORDER — ONDANSETRON HCL 4 MG/2ML IJ SOLN
4.0000 mg | Freq: Once | INTRAMUSCULAR | Status: AC
  Administered 2022-10-17: 4 mg via INTRAVENOUS
  Filled 2022-10-17: qty 2

## 2022-10-17 NOTE — ED Provider Notes (Signed)
[place] EMERGENCY DEPARTMENT AT [facility] Provider Note   CSN: [id number] Arrival date & time: 10/17/22  0745     History  Chief Complaint  Patient presents with   Nausea   Vomiting   [name] is a 32 year old female with nausea vomiting.    HPI [name] is a 32 y.o. female  with a past medical history of T1DM, gastroparesis, and HTN who presents to the ED for evaluation of 2 days of nausea and vomiting and diffuse abdominal pain. She has tried Zofran without significant relief.  She is able to keep food down.  She denies fevers, or diarrhea or around sick people.  She endorses compliance with glucose pump.  Preprandial glucose has been around 442/80, postprandial around 200.  Of note she was in the ED presented to the ED with similar symptoms on 6/16.    Home Medications Prior to Admission medications   Medication Sig Start Date End Date Taking? Authorizing Provider  Albuterol Sulfate (PROAIR RESPICLICK) 108 (90 Base) MCG/ACT AEPB Inhale 1-2 puffs into the lungs every 6 (six) hours as needed. Patient taking differently: Inhale 2-3 puffs into the lungs as needed (cold symptoms). 09/22/19   Nche, Bonna Gains, NP  amLODipine (NORVASC) 5 MG tablet Take 5 mg by mouth daily. 07/13/21   [provider]  Continuous Blood Gluc Sensor (FREESTYLE LIBRE 14 DAY SENSOR) MISC by Other route every fourteen (14) days. One libre sensor every 14 days 01/21/18   [provider]  etonogestrel (NEXPLANON) 68 MG IMPL implant 1 each by Subdermal route once. Implanted summer 2020    [provider]  Insulin Aspart (NOVOLOG IJ) Inject as directed. Inject before every meal and when blood sugar spikes per insulin pump.    [provider]  Insulin Disposable Pump (OMNIPOD 5 G6 POD, GEN 5,) MISC SMARTSIG:SUB-Q Every 3 Days    [provider]  Insulin Pen Needle (ULTRA-THIN II SHORT PEN NEEDLE) 31G X 8 MM MISC Inject into the skin. 01/21/18    [provider]  lisinopril (ZESTRIL) 40 MG tablet Take 40 mg by mouth every morning. 07/13/21   [provider]  metoCLOPramide (REGLAN) 10 MG tablet Take 1 tablet (10 mg total) by mouth every 6 (six) hours. 02/28/22   Gwyneth Sprout, MD  metoCLOPramide (REGLAN) 10 MG tablet Take 1 tablet (10 mg total) by mouth every 6 (six) hours. 04/01/22   Wynetta Fines, MD  ondansetron (ZOFRAN) 4 MG tablet Take 1 tablet (4 mg total) by mouth every 6 (six) hours. 04/01/22   Wynetta Fines, MD  ondansetron (ZOFRAN-ODT) 4 MG disintegrating tablet Take 1 tablet (4 mg total) by mouth 2 (two) times daily as needed for nausea or vomiting. 12/04/21   Leatha Gilding, MD  pantoprazole (PROTONIX) 40 MG tablet Take 1 tablet (40 mg total) by mouth daily as needed (heart burn). Patient taking differently: Take 40 mg by mouth daily. 08/05/21   Rolly Salter, MD  PARoxetine (PAXIL) 20 MG tablet Take 20 mg by mouth daily. 07/13/21   [provider]  polyethylene glycol powder (GLYCOLAX/MIRALAX) 17 GM/SCOOP powder Take 17 g by mouth daily. Patient not taking: Reported on 09/04/2021 09/22/19   Nche, Bonna Gains, NP  promethazine (PHENERGAN) 25 MG tablet Take 25 mg by mouth every 8 (eight) hours as needed for nausea or vomiting.    [provider]  promethazine (PHENERGAN) 25 MG tablet Take 1 tablet (25 mg total) by mouth every  6 (six) hours as needed for nausea or vomiting. 04/01/22   Wynetta Fines, MD  ranitidine (ZANTAC) 75 MG tablet Take 75 mg by mouth daily as needed for heartburn.  06/15/20  [provider]      Allergies    Aleve [naproxen sodium]    Review of Systems   Review of Systems   Physical Exam Updated Vital Signs BP (!) 125/94   Pulse (!) 104   Temp 97.8 F (36.6 C) (Oral)   Resp 10   Ht 5\' 4"  (1.626 m)   Wt 51.7 kg   LMP 09/11/2022 (Exact Date)   SpO2 100%   BMI 19.56 kg/m  Physical Exam  General appearance: Well-appearing, in mild acute  distress lying in bed. Lungs: CTAB, no crackles, no wheeze, with normal respiratory effort. CV: RRR, S1, S2, no murmurs. Abdomen: Soft, with diffuse tenderness.  Normal bowel sounds. Extremities: No peripheral edema, radial and DP pulses present bilaterally  Skin: Normal temperature, turgor and texture; no rash  ED Results / Procedures / Treatments   Labs (all labs ordered are listed, but only abnormal results are displayed) Labs Reviewed  CBC WITH DIFFERENTIAL/PLATELET - Abnormal; Notable for the following components:      Result Value   HCT 46.2 (*)    All other components within normal limits  CBG MONITORING, ED - Abnormal; Notable for the following components:   Glucose-Capillary 191 (*)    All other components within normal limits  BLOOD GAS, VENOUS  COMPREHENSIVE METABOLIC PANEL  LIPASE, BLOOD  URINALYSIS, ROUTINE W REFLEX MICROSCOPIC  PREGNANCY, URINE  BETA-HYDROXYBUTYRIC ACID    EKG None  Radiology No results found.  Procedures Procedures    Medications Ordered in ED Medications  sodium chloride 0.9 % bolus 1,000 mL (1,000 mLs Intravenous New Bag/Given 10/17/22 0827)  ondansetron (ZOFRAN) injection 4 mg (4 mg Intravenous Given 10/17/22 0829)  morphine (PF) 4 MG/ML injection 4 mg (4 mg Intravenous Given 10/17/22 1610)    ED Course/ Medical Decision Making/ A&P                             Medical Decision Making Amount and/or Complexity of Data Reviewed Labs: ordered.  Risk Prescription drug management.   [name] is a 32 year old female with a past medical history of type 1 diabetes gastroparesis and hypertension who presents to the ED for evaluation of 2 days of worsening nausea, vomiting and diffuse abdominal pain.  On exam she has mild abdominal pain in the mid epigastric region. He is afebrile, slightly tachycardic to 110.   Differential includes DKA vs.gastroparesis vs.pancreatitis vs. GERD.  The absence of lower abdominal tenderness and agrees  against that appendicitis.  No right upper quadrant tenderness to suggest biliary pathology.  Will plan to obtain CBC, lipase, VBG, UA, pregnancy test, beta hydroxybutyrate.  Will treat patient with Zofran, and IV fluids.        Final Clinical Impression(s) / ED Diagnoses Final diagnoses:  None    Rx / DC Orders ED Discharge Orders     None         Laretta Bolster, MD 10/17/22 9604    Jacalyn Lefevre, MD 10/18/22 1708

## 2022-10-17 NOTE — ED Notes (Addendum)
Discharge instructions reviewed with patient. Patient had no further questions at the time of discharge. Pt in no acute distress at time of discharge.

## 2022-10-17 NOTE — ED Triage Notes (Signed)
Pt arrived POV reporting N/V x2 days. Denies any other symptoms. Reports she is type one diabetic. Has not been running high. AAOX4, resp even and unlabored. NAD.

## 2022-10-19 ENCOUNTER — Telehealth: Payer: Self-pay

## 2022-10-19 NOTE — Transitions of Care (Post Inpatient/ED Visit) (Signed)
   10/19/2022  Name: [name] MRN: [id number] DOB: 1990-08-28  Today's TOC FU Call Status: Today's TOC FU Call Status:: Unsuccessful Call (2nd Attempt) Unsuccessful Call (2nd Attempt) Date: 10/19/22  Attempted to reach the patient regarding the most recent Inpatient/ED visit.  Follow Up Plan: Additional outreach attempts will be made to reach the patient to complete the Transitions of Care (Post Inpatient/ED visit) call.   Abelino Derrick, MHA [facility]  Managed [facility] Social Worker [phone]

## 2022-10-26 ENCOUNTER — Emergency Department (HOSPITAL_COMMUNITY): Payer: 59

## 2022-10-26 ENCOUNTER — Other Ambulatory Visit: Payer: Self-pay

## 2022-10-26 ENCOUNTER — Emergency Department (HOSPITAL_COMMUNITY)
Admission: EM | Admit: 2022-10-26 | Discharge: 2022-10-26 | Disposition: A | Discharge status: Home / Self Care | Payer: 59 | Attending: Emergency Medicine | Admitting: Emergency Medicine

## 2022-10-26 DIAGNOSIS — R1013 Epigastric pain: Secondary | ICD-10-CM | POA: Diagnosis not present

## 2022-10-26 DIAGNOSIS — R7989 Other specified abnormal findings of blood chemistry: Secondary | ICD-10-CM | POA: Diagnosis not present

## 2022-10-26 DIAGNOSIS — Z79899 Other long term (current) drug therapy: Secondary | ICD-10-CM | POA: Insufficient documentation

## 2022-10-26 DIAGNOSIS — I10 Essential (primary) hypertension: Secondary | ICD-10-CM | POA: Diagnosis not present

## 2022-10-26 DIAGNOSIS — R079 Chest pain, unspecified: Secondary | ICD-10-CM | POA: Insufficient documentation

## 2022-10-26 DIAGNOSIS — R000 Tachycardia, unspecified: Secondary | ICD-10-CM | POA: Diagnosis not present

## 2022-10-26 DIAGNOSIS — E109 Type 1 diabetes mellitus without complications: Secondary | ICD-10-CM | POA: Diagnosis not present

## 2022-10-26 DIAGNOSIS — Z794 Long term (current) use of insulin: Secondary | ICD-10-CM | POA: Diagnosis not present

## 2022-10-26 DIAGNOSIS — R109 Unspecified abdominal pain: Secondary | ICD-10-CM | POA: Diagnosis present

## 2022-10-26 LAB — URINALYSIS, ROUTINE W REFLEX MICROSCOPIC
Bacteria, UA: NONE SEEN
Bilirubin Urine: NEGATIVE
Glucose, UA: 150 mg/dL — AB
Hgb urine dipstick: NEGATIVE
Ketones, ur: 20 mg/dL — AB
Leukocytes,Ua: NEGATIVE
Nitrite: NEGATIVE
Protein, ur: 100 mg/dL — AB
Specific Gravity, Urine: 1.013 (ref 1.005–1.030)
pH: 7 (ref 5.0–8.0)

## 2022-10-26 LAB — CBC WITH DIFFERENTIAL/PLATELET
Abs Immature Granulocytes: 0.02 10*3/uL (ref 0.00–0.07)
Basophils Absolute: 0.1 10*3/uL (ref 0.0–0.1)
Basophils Relative: 1 %
Eosinophils Absolute: 0.1 10*3/uL (ref 0.0–0.5)
Eosinophils Relative: 1 %
HCT: 44.9 % (ref 36.0–46.0)
Hemoglobin: 14.8 g/dL (ref 12.0–15.0)
Immature Granulocytes: 0 %
Lymphocytes Relative: 32 %
Lymphs Abs: 2.2 10*3/uL (ref 0.7–4.0)
MCH: 30.6 pg (ref 26.0–34.0)
MCHC: 33 g/dL (ref 30.0–36.0)
MCV: 92.8 fL (ref 80.0–100.0)
Monocytes Absolute: 0.4 10*3/uL (ref 0.1–1.0)
Monocytes Relative: 6 %
Neutro Abs: 3.9 10*3/uL (ref 1.7–7.7)
Neutrophils Relative %: 60 %
Platelets: 317 10*3/uL (ref 150–400)
RBC: 4.84 MIL/uL (ref 3.87–5.11)
RDW: 13.2 % (ref 11.5–15.5)
WBC: 6.6 10*3/uL (ref 4.0–10.5)
nRBC: 0 % (ref 0.0–0.2)

## 2022-10-26 LAB — BLOOD GAS, VENOUS
Acid-Base Excess: 5 mmol/L — ABNORMAL HIGH (ref 0.0–2.0)
Bicarbonate: 31 mmol/L — ABNORMAL HIGH (ref 20.0–28.0)
O2 Saturation: 12.8 %
Patient temperature: 37
pCO2, Ven: 50 mmHg (ref 44–60)
pH, Ven: 7.4 (ref 7.25–7.43)
pO2, Ven: <31 mmHg — CL (ref 32–45)

## 2022-10-26 LAB — LIPASE, BLOOD: Lipase: 23 U/L (ref 11–51)

## 2022-10-26 LAB — TROPONIN I (HIGH SENSITIVITY)
Troponin I (High Sensitivity): 4 ng/L (ref <18)
Troponin I (High Sensitivity): 5 ng/L (ref <18)

## 2022-10-26 LAB — D-DIMER, QUANTITATIVE: D-Dimer, Quant: 0.9 ug/mL-FEU — ABNORMAL HIGH (ref 0.00–0.50)

## 2022-10-26 LAB — COMPREHENSIVE METABOLIC PANEL
ALT: 8 U/L (ref 0–44)
AST: 20 U/L (ref 15–41)
Albumin: 4.8 g/dL (ref 3.5–5.0)
Alkaline Phosphatase: 70 U/L (ref 38–126)
Anion gap: 14 (ref 5–15)
BUN: 18 mg/dL (ref 6–20)
CO2: 23 mmol/L (ref 22–32)
Calcium: 10 mg/dL (ref 8.9–10.3)
Chloride: 98 mmol/L (ref 98–111)
Creatinine, Ser: 1.26 mg/dL — ABNORMAL HIGH (ref 0.44–1.00)
GFR, Estimated: 59 mL/min — ABNORMAL LOW (ref >60)
Glucose, Bld: 221 mg/dL — ABNORMAL HIGH (ref 70–99)
Potassium: 4.2 mmol/L (ref 3.5–5.1)
Sodium: 135 mmol/L (ref 135–145)
Total Bilirubin: 0.7 mg/dL (ref 0.3–1.2)
Total Protein: 8.7 g/dL — ABNORMAL HIGH (ref 6.5–8.1)

## 2022-10-26 LAB — CBG MONITORING, ED: Glucose-Capillary: 200 mg/dL — ABNORMAL HIGH (ref 70–99)

## 2022-10-26 LAB — PREGNANCY, URINE: Preg Test, Ur: NEGATIVE

## 2022-10-26 MED ORDER — AMLODIPINE BESYLATE 5 MG PO TABS: 5.0000 mg | ORAL_TABLET | Freq: Every day | ORAL | 0 refills | Status: DC

## 2022-10-26 MED ORDER — LISINOPRIL 10 MG PO TABS
40.0000 mg | ORAL_TABLET | Freq: Once | ORAL | Status: AC
  Administered 2022-10-26: 40 mg via ORAL
  Filled 2022-10-26: qty 4

## 2022-10-26 MED ORDER — LISINOPRIL 40 MG PO TABS: 40.0000 mg | ORAL_TABLET | Freq: Every morning | ORAL | 0 refills | Status: DC

## 2022-10-26 MED ORDER — ONDANSETRON 4 MG PO TBDP: 4.0000 mg | ORAL_TABLET | Freq: Two times a day (BID) | ORAL | 0 refills | Status: DC | PRN

## 2022-10-26 MED ORDER — LIDOCAINE VISCOUS HCL 2 % MT SOLN
15.0000 mL | Freq: Once | OROMUCOSAL | Status: AC
  Administered 2022-10-26 (×2): 15 mL via ORAL
  Filled 2022-10-26: qty 15

## 2022-10-26 MED ORDER — ALUM & MAG HYDROXIDE-SIMETH 200-200-20 MG/5ML PO SUSP
30.0000 mL | Freq: Once | ORAL | Status: AC
  Administered 2022-10-26 (×2): 30 mL via ORAL
  Filled 2022-10-26: qty 30

## 2022-10-26 MED ORDER — AMLODIPINE BESYLATE 5 MG PO TABS
5.0000 mg | ORAL_TABLET | Freq: Once | ORAL | Status: AC
  Administered 2022-10-26: 5 mg via ORAL
  Filled 2022-10-26: qty 1

## 2022-10-26 MED ORDER — ONDANSETRON HCL 4 MG/2ML IJ SOLN
4.0000 mg | Freq: Once | INTRAMUSCULAR | Status: AC
  Administered 2022-10-26: 4 mg via INTRAVENOUS
  Filled 2022-10-26: qty 2

## 2022-10-26 MED ORDER — IOHEXOL 350 MG/ML SOLN
100.0000 mL | Freq: Once | INTRAVENOUS | Status: AC | PRN
  Administered 2022-10-26: 80 mL via INTRAVENOUS

## 2022-10-26 NOTE — Discharge Instructions (Addendum)
Please follow-up with your GI specialist regarding recent symptoms and ER visit.  Today your blood pressure was treated with your daily blood pressure meds but you will need to follow-up with your GI doctor and primary care provider.  I have prescribed for you Zofran to help with your nausea and vomiting.  If symptoms change or worsen please return to ER.  You may take Tylenol every 6 hours as needed for pain.

## 2022-10-26 NOTE — ED Notes (Signed)
Pt made aware of urine sample. Pt unable to void att.

## 2022-10-26 NOTE — ED Provider Notes (Signed)
[place] EMERGENCY DEPARTMENT AT [facility] Provider Note   CSN: [id number] Arrival date & time: 10/26/22  6578     History  Chief Complaint  Patient presents with   Abdominal Pain   Chest Pain    [name] is a 32 y.o. female history of hypertension, type 1 diabetes, DKA, protein S deficiency presented with abdominal and chest pain for the past week and a half.  Patient states that she has been having nonstop episodes of emesis and has been on take her blood pressure meds since her blood pressures were running high.  Patient states that she feels that she is in DKA even though sugars have been 150/160 at home and she has been taking her medications as prescribed.  Abdominal pain is epigastric and radiates up into her chest.  Patient is unsure what makes her abdominal pain or chest pain worse but states that nothing makes it better.  Patient denied dysuria, hematuria, change in sensation/motor skills, headache, left arm pain, left jaw pain, left neck pain, fevers, new onset weakness, leg swelling, hemoptysis, shortness of breath, recent travel/hospitalization/surgery  Home Medications Prior to Admission medications   Medication Sig Start Date End Date Taking? Authorizing Provider  Albuterol Sulfate (PROAIR RESPICLICK) 108 (90 Base) MCG/ACT AEPB Inhale 1-2 puffs into the lungs every 6 (six) hours as needed. Patient taking differently: Inhale 2-3 puffs into the lungs as needed (cold symptoms). 09/22/19   Nche, Bonna Gains, NP  amLODipine (NORVASC) 5 MG tablet Take 5 mg by mouth daily. 07/13/21   [provider]  Continuous Blood Gluc Sensor (FREESTYLE LIBRE 14 DAY SENSOR) MISC by Other route every fourteen (14) days. One libre sensor every 14 days 01/21/18   [provider]  etonogestrel (NEXPLANON) 68 MG IMPL implant 1 each by Subdermal route once. Implanted summer 2020    [provider]  Insulin Aspart (NOVOLOG IJ) Inject as directed. Inject  before every meal and when blood sugar spikes per insulin pump.    [provider]  Insulin Disposable Pump (OMNIPOD 5 G6 POD, GEN 5,) MISC SMARTSIG:SUB-Q Every 3 Days    [provider]  Insulin Pen Needle (ULTRA-THIN II SHORT PEN NEEDLE) 31G X 8 MM MISC Inject into the skin. 01/21/18   [provider]  lisinopril (ZESTRIL) 40 MG tablet Take 40 mg by mouth every morning. 07/13/21   [provider]  metoCLOPramide (REGLAN) 10 MG tablet Take 1 tablet (10 mg total) by mouth every 6 (six) hours. 02/28/22   Gwyneth Sprout, MD  metoCLOPramide (REGLAN) 10 MG tablet Take 1 tablet (10 mg total) by mouth every 6 (six) hours. 04/01/22   Wynetta Fines, MD  ondansetron (ZOFRAN) 4 MG tablet Take 1 tablet (4 mg total) by mouth every 6 (six) hours. 04/01/22   Wynetta Fines, MD  ondansetron (ZOFRAN-ODT) 4 MG disintegrating tablet Take 1 tablet (4 mg total) by mouth 2 (two) times daily as needed for nausea or vomiting. 10/26/22   Netta Corrigan, PA-C  pantoprazole (PROTONIX) 40 MG tablet Take 1 tablet (40 mg total) by mouth daily as needed (heart burn). Patient taking differently: Take 40 mg by mouth daily. 08/05/21   Rolly Salter, MD  PARoxetine (PAXIL) 20 MG tablet Take 20 mg by mouth daily. 07/13/21   [provider]  polyethylene glycol powder (GLYCOLAX/MIRALAX) 17 GM/SCOOP powder Take 17 g by mouth daily. Patient not taking: Reported on 09/04/2021 09/22/19   Nche, Bonna Gains, NP  promethazine (PHENERGAN) 25 MG tablet Take 25 mg by mouth every 8 (eight) hours as needed for nausea or vomiting.    [provider]  promethazine (PHENERGAN) 25 MG tablet Take 1 tablet (25 mg total) by mouth every 6 (six) hours as needed for nausea or vomiting. 04/01/22   Wynetta Fines, MD  ranitidine (ZANTAC) 75 MG tablet Take 75 mg by mouth daily as needed for heartburn.  06/15/20  [provider]      Allergies    Aleve [naproxen sodium]    Review of  Systems   Review of Systems  Cardiovascular:  Positive for chest pain.  Gastrointestinal:  Positive for abdominal pain.    Physical Exam Updated Vital Signs BP (!) 146/105 (BP Location: Right Arm)   Pulse 86   Temp 98.5 F (36.9 C) (Oral)   Resp 18   Ht 5\' 4"  (1.626 m)   Wt 51 kg   LMP 10/16/2022   SpO2 100%   BMI 19.30 kg/m  Physical Exam Vitals reviewed.  Constitutional:      General: She is not in acute distress. HENT:     Head: Normocephalic and atraumatic.  Eyes:     Extraocular Movements: Extraocular movements intact.     Conjunctiva/sclera: Conjunctivae normal.     Pupils: Pupils are equal, round, and reactive to light.  Cardiovascular:     Rate and Rhythm: Normal rate and regular rhythm.     Pulses: Normal pulses.     Heart sounds: Normal heart sounds.     Comments: 2+ bilateral radial/dorsalis pedis pulses with regular rate Pulmonary:     Effort: Pulmonary effort is normal. No respiratory distress.     Breath sounds: Normal breath sounds.  Abdominal:     Palpations: Abdomen is soft.     Tenderness: There is abdominal tenderness (Mild epigastric tenderness). There is no guarding or rebound.  Musculoskeletal:        General: Normal range of motion.     Cervical back: Normal range of motion and neck supple.     Comments: 5 out of 5 bilateral grip/leg extension strength  Skin:    General: Skin is warm and dry.     Capillary Refill: Capillary refill takes less than 2 seconds.     Coloration: Skin is not jaundiced.  Neurological:     General: No focal deficit present.     Mental Status: She is alert and oriented to person, place, and time.     Comments: Sensation intact in all 4 limbs  Psychiatric:        Mood and Affect: Mood normal.     ED Results / Procedures / Treatments   Labs (all labs ordered are listed, but only abnormal results are displayed) Labs Reviewed  COMPREHENSIVE METABOLIC PANEL - Abnormal; Notable for the following components:       Result Value   Glucose, Bld 221 (*)    Creatinine, Ser 1.26 (*)    Total Protein 8.7 (*)    GFR, Estimated 59 (*)    All other components within normal limits  URINALYSIS, ROUTINE W REFLEX MICROSCOPIC - Abnormal; Notable for the following components:   Glucose, UA 150 (*)    Ketones, ur 20 (*)    Protein, ur 100 (*)    All other components within normal limits  D-DIMER, QUANTITATIVE - Abnormal; Notable for the following components:   D-Dimer, Quant 0.90 (*)    All other components within normal limits  BLOOD  GAS, VENOUS - Abnormal; Notable for the following components:   pO2, Ven <31 (*)    Bicarbonate 31.0 (*)    Acid-Base Excess 5.0 (*)    All other components within normal limits  CBG MONITORING, ED - Abnormal; Notable for the following components:   Glucose-Capillary 200 (*)    All other components within normal limits  CBC WITH DIFFERENTIAL/PLATELET  LIPASE, BLOOD  PREGNANCY, URINE  BETA-HYDROXYBUTYRIC ACID  POC URINE PREG, ED  TROPONIN I (HIGH SENSITIVITY)  TROPONIN I (HIGH SENSITIVITY)    EKG EKG Interpretation Date/Time:  Friday October 26 2022 08:26:31 EDT Ventricular Rate:  107 PR Interval:  140 QRS Duration:  77 QT Interval:  322 QTC Calculation: 430 R Axis:   -76  Text Interpretation: Sinus tachycardia Right atrial enlargement Left anterior fascicular block Since last tracing rate faster Confirmed by Jacalyn Lefevre [phone]) on 10/26/2022 9:33:12 AM  Radiology CT Angio Chest PE W/Cm &/Or Wo Cm  Result Date: 10/26/2022 CLINICAL DATA:  Abdominal pain, unintentional weight loss, positive D-dimer level. EXAM: CT ANGIOGRAPHY CHEST CT ABDOMEN AND PELVIS WITH CONTRAST TECHNIQUE: Multidetector CT imaging of the chest was performed using the standard protocol during bolus administration of intravenous contrast. Multiplanar CT image reconstructions and MIPs were obtained to evaluate the vascular anatomy. Multidetector CT imaging of the abdomen and pelvis was performed using  the standard protocol during bolus administration of intravenous contrast. RADIATION DOSE REDUCTION: This exam was performed according to the departmental dose-optimization program which includes automated exposure control, adjustment of the mA and/or kV according to patient size and/or use of iterative reconstruction technique. CONTRAST:  80mL OMNIPAQUE IOHEXOL 350 MG/ML SOLN COMPARISON:  August 04, 2021. FINDINGS: CTA CHEST FINDINGS Cardiovascular: Satisfactory opacification of the pulmonary arteries to the segmental level. No evidence of pulmonary embolism. Normal heart size. No pericardial effusion. Mediastinum/Nodes: No enlarged mediastinal, hilar, or axillary lymph nodes. Thyroid gland, trachea, and esophagus demonstrate no significant findings. Lungs/Pleura: Lungs are clear. No pleural effusion or pneumothorax. Musculoskeletal: No chest wall abnormality. No acute or significant osseous findings. Review of the MIP images confirms the above findings. CT ABDOMEN and PELVIS FINDINGS Hepatobiliary: The gallbladder is not clearly visualized. No biliary dilatation is noted. The liver is unremarkable. Pancreas: Unremarkable. No pancreatic ductal dilatation or surrounding inflammatory changes. Spleen: Normal in size without focal abnormality. Adrenals/Urinary Tract: Adrenal glands are unremarkable. Kidneys are normal, without renal calculi, focal lesion, or hydronephrosis. Bladder is unremarkable. Stomach/Bowel: Stomach is within normal limits. Appendix appears normal. No evidence of bowel wall thickening, distention, or inflammatory changes. Vascular/Lymphatic: No significant vascular findings are present. No enlarged abdominal or pelvic lymph nodes. Reproductive: Uterus and bilateral adnexa are unremarkable. Other: No abdominal wall hernia or abnormality. No abdominopelvic ascites. Musculoskeletal: No acute or significant osseous findings. Review of the MIP images confirms the above findings. IMPRESSION: No definite  evidence of pulmonary embolus. No definite abnormality seen in the chest, abdomen or pelvis. Electronically Signed   By: Lupita Raider M.D.   On: 10/26/2022 13:05   CT ABDOMEN PELVIS W CONTRAST  Result Date: 10/26/2022 CLINICAL DATA:  Abdominal pain, unintentional weight loss, positive D-dimer level. EXAM: CT ANGIOGRAPHY CHEST CT ABDOMEN AND PELVIS WITH CONTRAST TECHNIQUE: Multidetector CT imaging of the chest was performed using the standard protocol during bolus administration of intravenous contrast. Multiplanar CT image reconstructions and MIPs were obtained to evaluate the vascular anatomy. Multidetector CT imaging of the abdomen and pelvis was performed using the standard protocol during bolus administration of  intravenous contrast. RADIATION DOSE REDUCTION: This exam was performed according to the departmental dose-optimization program which includes automated exposure control, adjustment of the mA and/or kV according to patient size and/or use of iterative reconstruction technique. CONTRAST:  80mL OMNIPAQUE IOHEXOL 350 MG/ML SOLN COMPARISON:  August 04, 2021. FINDINGS: CTA CHEST FINDINGS Cardiovascular: Satisfactory opacification of the pulmonary arteries to the segmental level. No evidence of pulmonary embolism. Normal heart size. No pericardial effusion. Mediastinum/Nodes: No enlarged mediastinal, hilar, or axillary lymph nodes. Thyroid gland, trachea, and esophagus demonstrate no significant findings. Lungs/Pleura: Lungs are clear. No pleural effusion or pneumothorax. Musculoskeletal: No chest wall abnormality. No acute or significant osseous findings. Review of the MIP images confirms the above findings. CT ABDOMEN and PELVIS FINDINGS Hepatobiliary: The gallbladder is not clearly visualized. No biliary dilatation is noted. The liver is unremarkable. Pancreas: Unremarkable. No pancreatic ductal dilatation or surrounding inflammatory changes. Spleen: Normal in size without focal abnormality.  Adrenals/Urinary Tract: Adrenal glands are unremarkable. Kidneys are normal, without renal calculi, focal lesion, or hydronephrosis. Bladder is unremarkable. Stomach/Bowel: Stomach is within normal limits. Appendix appears normal. No evidence of bowel wall thickening, distention, or inflammatory changes. Vascular/Lymphatic: No significant vascular findings are present. No enlarged abdominal or pelvic lymph nodes. Reproductive: Uterus and bilateral adnexa are unremarkable. Other: No abdominal wall hernia or abnormality. No abdominopelvic ascites. Musculoskeletal: No acute or significant osseous findings. Review of the MIP images confirms the above findings. IMPRESSION: No definite evidence of pulmonary embolus. No definite abnormality seen in the chest, abdomen or pelvis. Electronically Signed   By: Lupita Raider M.D.   On: 10/26/2022 13:05   DG Chest Port 1 View  Result Date: 10/26/2022 CLINICAL DATA:  Chest pain EXAM: PORTABLE CHEST 1 VIEW COMPARISON:  Chest radiograph 08/04/2021 FINDINGS: The cardiomediastinal silhouette is normal There is no focal consolidation or pulmonary edema. There is no pleural effusion or pneumothorax There is no acute osseous abnormality. IMPRESSION: No radiographic evidence of acute cardiopulmonary process. Electronically Signed   By: Lesia Hausen M.D.   On: 10/26/2022 09:37    Procedures Procedures    Medications Ordered in ED Medications  alum & mag hydroxide-simeth (MAALOX/MYLANTA) 200-200-20 MG/5ML suspension 30 mL (30 mLs Oral Given 10/26/22 1007)    And  lidocaine (XYLOCAINE) 2 % viscous mouth solution 15 mL (15 mLs Oral Given 10/26/22 1007)  ondansetron (ZOFRAN) injection 4 mg (4 mg Intravenous Given 10/26/22 0925)  amLODipine (NORVASC) tablet 5 mg (5 mg Oral Given 10/26/22 1108)  lisinopril (ZESTRIL) tablet 40 mg (40 mg Oral Given 10/26/22 1108)  iohexol (OMNIPAQUE) 350 MG/ML injection 100 mL (80 mLs Intravenous Contrast Given 10/26/22 1230)    ED Course/ Medical  Decision Making/ A&P                             Medical Decision Making Amount and/or Complexity of Data Reviewed Labs: ordered. Radiology: ordered.  Risk OTC drugs. Prescription drug management.   [name] 32 y.o. presented today for abdominal pain/chest pain. Working DDx that I considered at this time includes, but not limited to, acid reflux, diabetic gastroparesis, obstruction, PE, ACS, dissection, esophageal rupture, perforated peptic ulcer, hepatitis, cholecystitis, cholangitis.  R/o DDx: obstruction, PE, ACS, dissection, esophageal rupture, perforated peptic ulcer, hepatitis, cholecystitis, cholangitis: These are considered less likely due to history of present illness and physical exam findings  Review of prior external notes: 10/17/2022 ED  Unique Tests and My Interpretation:  CBG:  200 Urine pregnancy: Negative Lipase: Negative CBC: Unremarkable Beta hydroxybutyric acid: I-STAT VBG: Unremarkable Troponin: 4, 5 D-dimer: Elevated 0.90 UA: Unremarkable CMP: Hyperglycemia 221 EKG: Sinus tachycardia 107 bpm, no ST elevations or depressions noted, left anterior fascicular block noted Chest x-ray: No acute cardiopulmonary changes CTA chest: No acute changes CT ab pelvis contrast: No acute intra-abdominal changes  Discussion with Independent Historian: None  Discussion of Management of Tests: None  Risk: Medium: prescription drug management  Risk Stratification Score: None  Staffed with [place], MD  Plan: On exam patient was tachycardic but in no acute distress.  Patient blood pressure was noted to be in the 190s systolically as patient has not been able to take her blood pressure meds over the past few days due to her nausea vomiting.  Patient did have mild epigastric tenderness on exam however the rest of her exam was unremarkable.  Patient does have history of gastroparesis and with her stay and that the epigastric/chest pain being a burning sensation in  nature that is worse after eating of high suspicion patient has had an acid reflux.  However since patient does have hypercoagulable state, tachycardic, and endorsing chest pain a D-dimer was ordered.  Patient be given GI cocktail and blood pressure will be redone before providing fluids.  Patient stable this time.  D-dimer came back elevated and so CTA chest was ordered along with CT abdomen pelvis contrast due to patient's epigastric tenderness. I spoke to the attending about starting patient's BP meds due to patient's continuing HTN with concern for PE and she recommended starting patient's BP meds. Meds will be ordered and patient will be scanned.  Patient blood pressure did improve to 146/105.  Patient states after she took the Zofran her nausea resolved and her belly pain got better.  Patient's CT imaging was reassuring along with the rest of her labs.  At this time I have low suspicion of any life-threatening diagnosis.  I spoke to the patient and patient states she wants to be discharged and follow-up with her GI specialist.  Patient will be discharged encouraged use Tylenol every 6 hours as needed for pain and be given a prescription of Zofran.  Patient was given return precautions. Patient stable for discharge at this time.  Patient verbalized understanding of plan.         Final Clinical Impression(s) / ED Diagnoses Final diagnoses:  Epigastric pain  Chest pain, unspecified type  Uncontrolled hypertension    Rx / DC Orders ED Discharge Orders          Ordered    ondansetron (ZOFRAN-ODT) 4 MG disintegrating tablet  2 times daily PRN        10/26/22 1314              Remi Deter 10/26/22 1333    Jacalyn Lefevre, MD 10/26/22 1531

## 2022-10-26 NOTE — ED Notes (Signed)
PO2 31 James PA was present during call from lab.

## 2022-10-26 NOTE — ED Triage Notes (Signed)
Pt reports abdominal pain, nausea and vomiting x few days.

## 2022-10-26 NOTE — ED Notes (Signed)
Pt discharged home. Discharge information discussed. No distress observed during discharge.

## 2022-10-26 NOTE — ED Notes (Signed)
Pt states she takes Lisinopril 40 mg and Amlodipine 5 mg daily bu unable to take due to n/v and not able to keep anything down.

## 2022-10-29 ENCOUNTER — Telehealth: Payer: Self-pay

## 2022-10-29 NOTE — Transitions of Care (Post Inpatient/ED Visit) (Signed)
   10/29/2022  Name: [name] MRN: [id number] DOB: 09-27-1990  Today's TOC FU Call Status: Today's TOC FU Call Status:: Unsuccessul Call (1st Attempt)  Attempted to reach the patient regarding the most recent Inpatient/ED visit.  Follow Up Plan: Additional outreach attempts will be made to reach the patient to complete the Transitions of Care (Post Inpatient/ED visit) call.   Abelino Derrick, MHA [facility]  Managed [facility] Social Worker [phone]

## 2022-10-30 ENCOUNTER — Telehealth: Payer: Self-pay

## 2022-10-30 NOTE — Transitions of Care (Post Inpatient/ED Visit) (Signed)
   10/30/2022  Name: [name] MRN: [id number] DOB: 18-Jun-1990  Today's TOC FU Call Status: Today's TOC FU Call Status:: Unsuccessful Call (2nd Attempt)  Attempted to reach the patient regarding the most recent Inpatient/ED visit.  Follow Up Plan: Additional outreach attempts will be made to reach the patient to complete the Transitions of Care (Post Inpatient/ED visit) call.   Abelino Derrick, MHA [facility]  Managed [facility] Social Worker [phone]

## 2023-01-10 ENCOUNTER — Encounter (HOSPITAL_COMMUNITY): Payer: Self-pay | Admitting: Emergency Medicine

## 2023-01-10 ENCOUNTER — Other Ambulatory Visit: Payer: Self-pay

## 2023-01-10 ENCOUNTER — Emergency Department (HOSPITAL_COMMUNITY)
Admission: EM | Admit: 2023-01-10 | Discharge: 2023-01-10 | Disposition: A | Discharge status: Home / Self Care | Payer: 59 | Attending: Emergency Medicine | Admitting: Emergency Medicine

## 2023-01-10 DIAGNOSIS — I10 Essential (primary) hypertension: Secondary | ICD-10-CM | POA: Insufficient documentation

## 2023-01-10 DIAGNOSIS — R109 Unspecified abdominal pain: Secondary | ICD-10-CM | POA: Diagnosis present

## 2023-01-10 DIAGNOSIS — Z794 Long term (current) use of insulin: Secondary | ICD-10-CM | POA: Insufficient documentation

## 2023-01-10 DIAGNOSIS — R000 Tachycardia, unspecified: Secondary | ICD-10-CM | POA: Diagnosis not present

## 2023-01-10 DIAGNOSIS — Z79899 Other long term (current) drug therapy: Secondary | ICD-10-CM | POA: Insufficient documentation

## 2023-01-10 DIAGNOSIS — E119 Type 2 diabetes mellitus without complications: Secondary | ICD-10-CM | POA: Diagnosis not present

## 2023-01-10 DIAGNOSIS — R1084 Generalized abdominal pain: Secondary | ICD-10-CM | POA: Insufficient documentation

## 2023-01-10 DIAGNOSIS — J45909 Unspecified asthma, uncomplicated: Secondary | ICD-10-CM | POA: Insufficient documentation

## 2023-01-10 LAB — URINALYSIS, ROUTINE W REFLEX MICROSCOPIC
Bilirubin Urine: NEGATIVE
Glucose, UA: 50 mg/dL — AB
Hgb urine dipstick: NEGATIVE
Ketones, ur: 20 mg/dL — AB
Leukocytes,Ua: NEGATIVE
Nitrite: NEGATIVE
Protein, ur: 30 mg/dL — AB
Specific Gravity, Urine: 1.01 (ref 1.005–1.030)
pH: 6 (ref 5.0–8.0)

## 2023-01-10 LAB — COMPREHENSIVE METABOLIC PANEL
ALT: 8 U/L (ref 0–44)
AST: 13 U/L — ABNORMAL LOW (ref 15–41)
Albumin: 4.2 g/dL (ref 3.5–5.0)
Alkaline Phosphatase: 66 U/L (ref 38–126)
Anion gap: 10 (ref 5–15)
BUN: 9 mg/dL (ref 6–20)
CO2: 21 mmol/L — ABNORMAL LOW (ref 22–32)
Calcium: 9.5 mg/dL (ref 8.9–10.3)
Chloride: 105 mmol/L (ref 98–111)
Creatinine, Ser: 0.89 mg/dL (ref 0.44–1.00)
GFR, Estimated: >60 mL/min (ref >60)
Glucose, Bld: 184 mg/dL — ABNORMAL HIGH (ref 70–99)
Potassium: 3.7 mmol/L (ref 3.5–5.1)
Sodium: 136 mmol/L (ref 135–145)
Total Bilirubin: 0.9 mg/dL (ref 0.3–1.2)
Total Protein: 7.8 g/dL (ref 6.5–8.1)

## 2023-01-10 LAB — CBC
HCT: 40.1 % (ref 36.0–46.0)
Hemoglobin: 13.2 g/dL (ref 12.0–15.0)
MCH: 30.6 pg (ref 26.0–34.0)
MCHC: 32.9 g/dL (ref 30.0–36.0)
MCV: 93 fL (ref 80.0–100.0)
Platelets: 303 10*3/uL (ref 150–400)
RBC: 4.31 MIL/uL (ref 3.87–5.11)
RDW: 13.2 % (ref 11.5–15.5)
WBC: 8.3 10*3/uL (ref 4.0–10.5)
nRBC: 0 % (ref 0.0–0.2)

## 2023-01-10 LAB — LIPASE, BLOOD: Lipase: 18 U/L (ref 11–51)

## 2023-01-10 LAB — CBG MONITORING, ED: Glucose-Capillary: 159 mg/dL — ABNORMAL HIGH (ref 70–99)

## 2023-01-10 MED ORDER — ONDANSETRON HCL 4 MG/2ML IJ SOLN
4.0000 mg | Freq: Once | INTRAMUSCULAR | Status: AC
  Administered 2023-01-10: 4 mg via INTRAVENOUS
  Filled 2023-01-10: qty 2

## 2023-01-10 MED ORDER — METOCLOPRAMIDE HCL 5 MG/ML IJ SOLN
10.0000 mg | Freq: Once | INTRAMUSCULAR | Status: AC
  Administered 2023-01-10: 10 mg via INTRAVENOUS
  Filled 2023-01-10: qty 2

## 2023-01-10 MED ORDER — AMLODIPINE BESYLATE 5 MG PO TABS
5.0000 mg | ORAL_TABLET | Freq: Once | ORAL | Status: AC
  Administered 2023-01-10: 5 mg via ORAL
  Filled 2023-01-10: qty 1

## 2023-01-10 MED ORDER — MORPHINE SULFATE (PF) 4 MG/ML IV SOLN
4.0000 mg | Freq: Once | INTRAVENOUS | Status: AC
  Administered 2023-01-10: 4 mg via INTRAVENOUS
  Filled 2023-01-10: qty 1

## 2023-01-10 MED ORDER — PANTOPRAZOLE SODIUM 40 MG IV SOLR
40.0000 mg | Freq: Once | INTRAVENOUS | Status: AC
  Administered 2023-01-10: 40 mg via INTRAVENOUS
  Filled 2023-01-10: qty 10

## 2023-01-10 MED ORDER — LISINOPRIL 10 MG PO TABS
40.0000 mg | ORAL_TABLET | Freq: Once | ORAL | Status: AC
  Administered 2023-01-10: 40 mg via ORAL
  Filled 2023-01-10: qty 4

## 2023-01-10 MED ORDER — SODIUM CHLORIDE 0.9 % IV BOLUS
1000.0000 mL | Freq: Once | INTRAVENOUS | Status: AC
  Administered 2023-01-10: 1000 mL via INTRAVENOUS

## 2023-01-10 MED ORDER — METOCLOPRAMIDE HCL 10 MG PO TABS: 10.0000 mg | ORAL_TABLET | Freq: Four times a day (QID) | ORAL | 0 refills | Status: DC | PRN

## 2023-01-10 NOTE — ED Notes (Signed)
Unable to give urine sample

## 2023-01-10 NOTE — ED Provider Notes (Signed)
[place] EMERGENCY DEPARTMENT AT [facility] Provider Note   CSN: [id number] Arrival date & time: 01/10/23  4098     History  Chief Complaint  Patient presents with   Emesis   Nausea    [name] is a 32 y.o. female with medical history of diabetic gastroparesis, diabetes, asthma, hypertension.  Patient presents to ED for evaluation of nausea, vomiting, abdominal pain.  The patient reports that beginning yesterday she developed nausea, vomiting and abdominal pain.  She reports that this is very consistent with past episodes of gastroparesis.  States that her sugars have been elevated at home in the 400s however this morning when she checked was 158.  Sugar reassuring here with fingerstick.  Patient reports she has a history of gastroparesis and is currently being seen by GI doctors who cannot tell her why she is having the symptoms.  She reports that she is having diarrhea.  She denies chest pain, shortness of breath, fevers, headache, blurred vision, dysuria, flank pain.  Reports that she is taking blood pressure medication but was unable to do so this morning due to her nausea and vomiting.   Emesis Associated symptoms: abdominal pain   Associated symptoms: no fever and no headaches        Home Medications Prior to Admission medications   Medication Sig Start Date End Date Taking? Authorizing Provider  metoCLOPramide (REGLAN) 10 MG tablet Take 1 tablet (10 mg total) by mouth every 6 (six) hours as needed for nausea. 01/10/23  Yes Al Decant, PA-C  Albuterol Sulfate (PROAIR RESPICLICK) 108 (90 Base) MCG/ACT AEPB Inhale 1-2 puffs into the lungs every 6 (six) hours as needed. Patient taking differently: Inhale 2-3 puffs into the lungs as needed (cold symptoms). 09/22/19   Nche, Bonna Gains, NP  amLODipine (NORVASC) 5 MG tablet Take 1 tablet (5 mg total) by mouth daily. 10/26/22   Netta Corrigan, PA-C  Continuous Blood Gluc Sensor (FREESTYLE LIBRE 14 DAY  SENSOR) MISC by Other route every fourteen (14) days. One libre sensor every 14 days 01/21/18   [provider]  etonogestrel (NEXPLANON) 68 MG IMPL implant 1 each by Subdermal route once. Implanted summer 2020    [provider]  Insulin Aspart (NOVOLOG IJ) Inject as directed. Inject before every meal and when blood sugar spikes per insulin pump.    [provider]  Insulin Disposable Pump (OMNIPOD 5 G6 POD, GEN 5,) MISC SMARTSIG:SUB-Q Every 3 Days    [provider]  Insulin Pen Needle (ULTRA-THIN II SHORT PEN NEEDLE) 31G X 8 MM MISC Inject into the skin. 01/21/18   [provider]  lisinopril (ZESTRIL) 40 MG tablet Take 1 tablet (40 mg total) by mouth every morning. 10/26/22   Netta Corrigan, PA-C  ondansetron (ZOFRAN) 4 MG tablet Take 1 tablet (4 mg total) by mouth every 6 (six) hours. 04/01/22   Wynetta Fines, MD  ondansetron (ZOFRAN-ODT) 4 MG disintegrating tablet Take 1 tablet (4 mg total) by mouth 2 (two) times daily as needed for nausea or vomiting. 10/26/22   Netta Corrigan, PA-C  pantoprazole (PROTONIX) 40 MG tablet Take 1 tablet (40 mg total) by mouth daily as needed (heart burn). Patient taking differently: Take 40 mg by mouth daily. 08/05/21   Rolly Salter, MD  PARoxetine (PAXIL) 20 MG tablet Take 20 mg by mouth daily. 07/13/21   [provider]  polyethylene glycol powder (GLYCOLAX/MIRALAX) 17 GM/SCOOP powder Take 17 g by  mouth daily. Patient not taking: Reported on 09/04/2021 09/22/19   Nche, Bonna Gains, NP  promethazine (PHENERGAN) 25 MG tablet Take 25 mg by mouth every 8 (eight) hours as needed for nausea or vomiting.    [provider]  promethazine (PHENERGAN) 25 MG tablet Take 1 tablet (25 mg total) by mouth every 6 (six) hours as needed for nausea or vomiting. 04/01/22   Wynetta Fines, MD  ranitidine (ZANTAC) 75 MG tablet Take 75 mg by mouth daily as needed for heartburn.  06/15/20  [provider]       Allergies    Aleve [naproxen sodium]    Review of Systems   Review of Systems  Constitutional:  Negative for fever.  Eyes:  Negative for photophobia.  Respiratory:  Negative for shortness of breath.   Cardiovascular:  Negative for chest pain.  Gastrointestinal:  Positive for abdominal pain, nausea and vomiting.  Genitourinary:  Negative for dysuria.  Neurological:  Negative for headaches.  All other systems reviewed and are negative.   Physical Exam Updated Vital Signs BP (!) 140/85   Pulse (!) 101   Temp 97.8 F (36.6 C)   Resp 16   LMP 12/27/2022 (Approximate)   SpO2 100%  Physical Exam Vitals and nursing note reviewed.  Constitutional:      General: She is not in acute distress.    Appearance: Normal appearance. She is not ill-appearing, toxic-appearing or diaphoretic.  HENT:     Head: Normocephalic and atraumatic.     Nose: Nose normal.     Mouth/Throat:     Mouth: Mucous membranes are moist.     Pharynx: Oropharynx is clear.  Eyes:     Extraocular Movements: Extraocular movements intact.     Conjunctiva/sclera: Conjunctivae normal.     Pupils: Pupils are equal, round, and reactive to light.  Cardiovascular:     Rate and Rhythm: Regular rhythm. Tachycardia present.  Pulmonary:     Effort: Pulmonary effort is normal.     Breath sounds: Normal breath sounds. No wheezing.  Abdominal:     General: Abdomen is flat. Bowel sounds are normal.     Palpations: Abdomen is soft.     Tenderness: There is abdominal tenderness. There is no right CVA tenderness or left CVA tenderness.     Comments: Generalized abdominal tenderness.  No rebound or guarding.  No CVA tenderness.  Musculoskeletal:     Cervical back: Normal range of motion and neck supple.  Skin:    General: Skin is warm and dry.     Capillary Refill: Capillary refill takes less than 2 seconds.  Neurological:     Mental Status: She is alert and oriented to person, place, and time.     ED Results /  Procedures / Treatments   Labs (all labs ordered are listed, but only abnormal results are displayed) Labs Reviewed  COMPREHENSIVE METABOLIC PANEL - Abnormal; Notable for the following components:      Result Value   CO2 21 (*)    Glucose, Bld 184 (*)    AST 13 (*)    All other components within normal limits  URINALYSIS, ROUTINE W REFLEX MICROSCOPIC - Abnormal; Notable for the following components:   Color, Urine STRAW (*)    Glucose, UA 50 (*)    Ketones, ur 20 (*)    Protein, ur 30 (*)    Bacteria, UA RARE (*)    All other components within normal limits  CBG MONITORING, ED -  Abnormal; Notable for the following components:   Glucose-Capillary 159 (*)    All other components within normal limits  CBC  LIPASE, BLOOD    EKG EKG Interpretation Date/Time:  Thursday January 10 2023 08:44:08 EDT Ventricular Rate:  101 PR Interval:  146 QRS Duration:  76 QT Interval:  339 QTC Calculation: 440 R Axis:   -40  Text Interpretation: Sinus tachycardia Consider right atrial enlargement Left axis deviation Baseline wander in lead(s) V4 No significant change since last tracing Confirmed by Linwood Dibbles [phone]) on 01/10/2023 8:48:33 AM  Radiology No results found.  Procedures Procedures   Medications Ordered in ED Medications  ondansetron (ZOFRAN) injection 4 mg (4 mg Intravenous Given 01/10/23 0922)  morphine (PF) 4 MG/ML injection 4 mg (4 mg Intravenous Given 01/10/23 0925)  pantoprazole (PROTONIX) injection 40 mg (40 mg Intravenous Given 01/10/23 0923)  sodium chloride 0.9 % bolus 1,000 mL (0 mLs Intravenous Stopped 01/10/23 1118)  amLODipine (NORVASC) tablet 5 mg (5 mg Oral Given 01/10/23 1112)  lisinopril (ZESTRIL) tablet 40 mg (40 mg Oral Given 01/10/23 1114)  metoCLOPramide (REGLAN) injection 10 mg (10 mg Intravenous Given 01/10/23 1129)  sodium chloride 0.9 % bolus 1,000 mL (1,000 mLs Intravenous New Bag/Given 01/10/23 1209)    ED Course/ Medical Decision Making/ A&P  Medical  Decision Making Amount and/or Complexity of Data Reviewed Labs: ordered.  Risk Prescription drug management.   32 year old female presents to the ED for evaluation.  Please see HPI for further details.  On examination the patient is afebrile, tachycardic.  Lung sounds are clear bilaterally, she is nonhypoxic.  Abdomen soft and compressible throughout however does endorse tenderness that is generalized in nature.  Nonfocal.  No CVA tenderness bilaterally.  Neurological examination is at baseline.  Patient is very hypertensive but states she has not taken blood pressure medication.  Denies chest pain, shortness of breath, blurred vision or headache.  Patient CBC shows no leukocytosis, no anemia.  Metabolic panel without electrolyte derangement, no elevated LFTs, anion gap 10, creatinine elevated.  Lipase WNL.  Urinalysis shows ketones and protein.  Patient was given 2 L of fluid.  Patient was given Zofran, morphine for pain, Protonix as she reports that she feels as if she has heartburn.  On reassessment patient reports continued nausea.  Reglan administered and patient states nausea better at this time.  Provided home blood pressure medication for p.o. fluid challenge and patient passed.  Patient will be discharged at this time.  Will send her home with Reglan.  Advised patient to follow-up with her gastroenterology doctor.  She voices understanding.  Encouraged to return to the ED with any new or worsening symptoms.  Discharged in stable condition.   Final Clinical Impression(s) / ED Diagnoses Final diagnoses:  Generalized abdominal pain    Rx / DC Orders ED Discharge Orders          Ordered    metoCLOPramide (REGLAN) 10 MG tablet  Every 6 hours PRN        01/10/23 1253              Al Decant, PA-C 01/10/23 1254    Linwood Dibbles, MD 01/11/23 [phone]

## 2023-01-10 NOTE — Discharge Instructions (Signed)
It was a pleasure taking part in your care today.  As we discussed, I believe you have gastroparesis.  I am sending you home with Reglan which you should take every 6 hours as needed for nausea and vomiting.  Please continue taking all home medications as prescribed.  Please follow-up with GI team.  Please return to the ED with any new or worsening symptoms.

## 2023-01-10 NOTE — ED Triage Notes (Signed)
Pt arrived POV c/o N/V, and abd pain since this morning, hx of diabetes. Pt noted to be hypertensive in triage, does take htn meds but did not take them today d/t not feeling well. Denies chest pain or any other symptoms.

## 2023-02-11 DIAGNOSIS — R1115 Cyclical vomiting syndrome unrelated to migraine: Secondary | ICD-10-CM | POA: Diagnosis not present

## 2023-02-11 DIAGNOSIS — K219 Gastro-esophageal reflux disease without esophagitis: Secondary | ICD-10-CM | POA: Diagnosis not present

## 2023-02-11 DIAGNOSIS — R1013 Epigastric pain: Secondary | ICD-10-CM | POA: Diagnosis not present

## 2023-03-29 ENCOUNTER — Emergency Department (HOSPITAL_COMMUNITY): Payer: 59

## 2023-03-29 ENCOUNTER — Emergency Department (HOSPITAL_COMMUNITY)
Admission: EM | Admit: 2023-03-29 | Discharge: 2023-03-29 | Disposition: A | Discharge status: Home / Self Care | Payer: 59 | Attending: Emergency Medicine | Admitting: Emergency Medicine

## 2023-03-29 ENCOUNTER — Encounter (HOSPITAL_COMMUNITY): Payer: Self-pay | Admitting: Radiology

## 2023-03-29 ENCOUNTER — Other Ambulatory Visit: Payer: Self-pay

## 2023-03-29 DIAGNOSIS — R112 Nausea with vomiting, unspecified: Secondary | ICD-10-CM | POA: Insufficient documentation

## 2023-03-29 DIAGNOSIS — R1013 Epigastric pain: Secondary | ICD-10-CM | POA: Diagnosis not present

## 2023-03-29 DIAGNOSIS — E1165 Type 2 diabetes mellitus with hyperglycemia: Secondary | ICD-10-CM | POA: Diagnosis not present

## 2023-03-29 DIAGNOSIS — E860 Dehydration: Secondary | ICD-10-CM | POA: Diagnosis not present

## 2023-03-29 DIAGNOSIS — Z794 Long term (current) use of insulin: Secondary | ICD-10-CM | POA: Insufficient documentation

## 2023-03-29 LAB — CBC WITH DIFFERENTIAL/PLATELET
Abs Immature Granulocytes: 0.1 10*3/uL — ABNORMAL HIGH (ref 0.00–0.07)
Basophils Absolute: 0.1 10*3/uL (ref 0.0–0.1)
Basophils Relative: 0 %
Eosinophils Absolute: 0 10*3/uL (ref 0.0–0.5)
Eosinophils Relative: 0 %
HCT: 40.4 % (ref 36.0–46.0)
Hemoglobin: 13.7 g/dL (ref 12.0–15.0)
Immature Granulocytes: 1 %
Lymphocytes Relative: 16 %
Lymphs Abs: 2.6 10*3/uL (ref 0.7–4.0)
MCH: 30.8 pg (ref 26.0–34.0)
MCHC: 33.9 g/dL (ref 30.0–36.0)
MCV: 90.8 fL (ref 80.0–100.0)
Monocytes Absolute: 1 10*3/uL (ref 0.1–1.0)
Monocytes Relative: 6 %
Neutro Abs: 12.4 10*3/uL — ABNORMAL HIGH (ref 1.7–7.7)
Neutrophils Relative %: 77 %
Platelets: 240 10*3/uL (ref 150–400)
RBC: 4.45 MIL/uL (ref 3.87–5.11)
RDW: 13.2 % (ref 11.5–15.5)
WBC: 16.2 10*3/uL — ABNORMAL HIGH (ref 4.0–10.5)
nRBC: 0 % (ref 0.0–0.2)

## 2023-03-29 LAB — BASIC METABOLIC PANEL
Anion gap: 11 (ref 5–15)
Anion gap: 9 (ref 5–15)
BUN: 31 mg/dL — ABNORMAL HIGH (ref 6–20)
BUN: 27 mg/dL — ABNORMAL HIGH (ref 6–20)
CO2: 17 mmol/L — ABNORMAL LOW (ref 22–32)
CO2: 19 mmol/L — ABNORMAL LOW (ref 22–32)
Calcium: 8.4 mg/dL — ABNORMAL LOW (ref 8.9–10.3)
Calcium: 8.5 mg/dL — ABNORMAL LOW (ref 8.9–10.3)
Chloride: 105 mmol/L (ref 98–111)
Chloride: 106 mmol/L (ref 98–111)
Creatinine, Ser: 1.56 mg/dL — ABNORMAL HIGH (ref 0.44–1.00)
Creatinine, Ser: 1.45 mg/dL — ABNORMAL HIGH (ref 0.44–1.00)
GFR, Estimated: 45 mL/min — ABNORMAL LOW (ref >60)
GFR, Estimated: 49 mL/min — ABNORMAL LOW (ref >60)
Glucose, Bld: 141 mg/dL — ABNORMAL HIGH (ref 70–99)
Glucose, Bld: 178 mg/dL — ABNORMAL HIGH (ref 70–99)
Potassium: 3.5 mmol/L (ref 3.5–5.1)
Potassium: 3.6 mmol/L (ref 3.5–5.1)
Sodium: 133 mmol/L — ABNORMAL LOW (ref 135–145)
Sodium: 134 mmol/L — ABNORMAL LOW (ref 135–145)

## 2023-03-29 LAB — I-STAT CHEM 8, ED
BUN: 31 mg/dL — ABNORMAL HIGH (ref 6–20)
Calcium, Ion: 1.2 mmol/L (ref 1.15–1.40)
Chloride: 101 mmol/L (ref 98–111)
Creatinine, Ser: 1.7 mg/dL — ABNORMAL HIGH (ref 0.44–1.00)
Glucose, Bld: 147 mg/dL — ABNORMAL HIGH (ref 70–99)
HCT: 41 % (ref 36.0–46.0)
Hemoglobin: 13.9 g/dL (ref 12.0–15.0)
Potassium: 4 mmol/L (ref 3.5–5.1)
Sodium: 134 mmol/L — ABNORMAL LOW (ref 135–145)
TCO2: 19 mmol/L — ABNORMAL LOW (ref 22–32)

## 2023-03-29 LAB — CBG MONITORING, ED
Glucose-Capillary: 165 mg/dL — ABNORMAL HIGH (ref 70–99)
Glucose-Capillary: 114 mg/dL — ABNORMAL HIGH (ref 70–99)

## 2023-03-29 LAB — BETA-HYDROXYBUTYRIC ACID: Beta-Hydroxybutyric Acid: 4.9 mmol/L — ABNORMAL HIGH (ref 0.05–0.27)

## 2023-03-29 LAB — HCG, QUANTITATIVE, PREGNANCY: hCG, Beta Chain, Quant, S: <1 m[IU]/mL (ref <5)

## 2023-03-29 LAB — BLOOD GAS, VENOUS
Acid-base deficit: 4.3 mmol/L — ABNORMAL HIGH (ref 0.0–2.0)
Bicarbonate: 21.6 mmol/L (ref 20.0–28.0)
O2 Saturation: 55.2 %
Patient temperature: 37
pCO2, Ven: 42 mm[Hg] — ABNORMAL LOW (ref 44–60)
pH, Ven: 7.32 (ref 7.25–7.43)
pO2, Ven: 35 mm[Hg] (ref 32–45)

## 2023-03-29 LAB — HCG, SERUM, QUALITATIVE: Preg, Serum: NEGATIVE

## 2023-03-29 MED ORDER — SODIUM CHLORIDE 0.9% FLUSH: 10.0000 mL | Freq: Two times a day (BID) | INTRAVENOUS | Status: DC

## 2023-03-29 MED ORDER — METOCLOPRAMIDE HCL 5 MG/ML IJ SOLN
10.0000 mg | Freq: Once | INTRAMUSCULAR | Status: AC
  Administered 2023-03-29: 10 mg via INTRAVENOUS
  Filled 2023-03-29: qty 2

## 2023-03-29 MED ORDER — DEXTROSE-SODIUM CHLORIDE 5-0.45 % IV SOLN: INTRAVENOUS | Status: DC

## 2023-03-29 MED ORDER — SODIUM CHLORIDE 0.9 % IV BOLUS
1000.0000 mL | INTRAVENOUS | Status: AC
  Administered 2023-03-29: 1000 mL via INTRAVENOUS

## 2023-03-29 MED ORDER — DEXTROSE 50 % IV SOLN: 0.0000 mL | INTRAVENOUS | Status: DC | PRN

## 2023-03-29 MED ORDER — PANTOPRAZOLE SODIUM 40 MG IV SOLR
40.0000 mg | Freq: Once | INTRAVENOUS | Status: AC
  Administered 2023-03-29: 40 mg via INTRAVENOUS
  Filled 2023-03-29: qty 10

## 2023-03-29 MED ORDER — SODIUM CHLORIDE 0.9 % IV BOLUS
1000.0000 mL | Freq: Once | INTRAVENOUS | Status: AC
  Administered 2023-03-29: 1000 mL via INTRAVENOUS

## 2023-03-29 MED ORDER — POTASSIUM CHLORIDE 10 MEQ/100ML IV SOLN
10.0000 meq | INTRAVENOUS | Status: AC
  Filled 2023-03-29: qty 100

## 2023-03-29 MED ORDER — INSULIN REGULAR(HUMAN) IN NACL 100-0.9 UT/100ML-% IV SOLN
INTRAVENOUS | Status: DC
  Filled 2023-03-29: qty 100

## 2023-03-29 NOTE — Discharge Instructions (Signed)
Drink plenty of fluids.  Make sure you start back taking your acid reflux medicine twice a day.  Return to the emergency department if you have continued vomiting.  Use your Phenergan suppositories as needed.  Follow-up with your family doctor next week

## 2023-03-29 NOTE — ED Triage Notes (Signed)
Pt arrived reporting stomach pain and vomiting since last night. Denies any other symptoms. Type 1 diabetic BG 165.

## 2023-03-29 NOTE — Inpatient Diabetes Management (Signed)
Inpatient Diabetes Program Recommendations  AACE/ADA: New Consensus Statement on Inpatient Glycemic Control (2015)  Target Ranges:  Prepandial:   less than 140 mg/dL      Peak postprandial:   less than 180 mg/dL (1-2 hours)      Critically ill patients:  140 - 180 mg/dL   Lab Results  Component Value Date   GLUCAP 114 (H) 03/29/2023   HGBA1C 7.9 (H) 08/05/2021    Review of Glycemic Control  Diabetes history: DM type 1 Outpatient Diabetes medications: Omnipod insulin pump with dexcom G7 CGM Insulin pump settings:  Insulin pump in auto mode on average gives pt between 18-22 units of basal insulin in a 24 hours period CHO ratio 1: 7 Sensitivity 1: 70 Target 130 Starts correction at 150 mg/dl Insulin duration 4 hours  Pt goes to [facility] and sees Dr. Maple Hudson Last appt was 9/10 Has next follow up on 04/02/2023.   Spoke with pt at bedside to get insulin pump settings dexcom is placed currently on left arm and her insulin pump is located on her right arm. Pt being discharged from ED.  Thanks,  Christena Deem RN, MSN, BC-ADM Inpatient Diabetes Coordinator Team Pager [phone] (8a-5p)

## 2023-03-29 NOTE — ED Provider Notes (Signed)
[place] EMERGENCY DEPARTMENT AT [facility] Provider Note   CSN: [id number] Arrival date & time: 03/29/23  4401     History {Add pertinent medical, surgical, social history, OB history to HPI:1} Chief Complaint  Patient presents with   Abdominal Pain    [name] is a 32 y.o. female.  Patient has a history of diabetes.  She states she has been vomiting today.  No fevers no chills.  Patient has had DKA before but she states it has been a very long time.  She has also had mild abdominal discomfort  The history is provided by the patient and medical records. No language interpreter was used.  Abdominal Pain Pain location:  Epigastric Pain quality: aching   Pain radiates to:  Does not radiate Onset quality:  Sudden Timing:  Intermittent Progression:  Waxing and waning Chronicity:  New Context: not alcohol use   Associated symptoms: vomiting   Associated symptoms: no chest pain, no cough, no diarrhea, no fatigue and no hematuria        Home Medications Prior to Admission medications   Medication Sig Start Date End Date Taking? Authorizing Provider  Albuterol Sulfate (PROAIR RESPICLICK) 108 (90 Base) MCG/ACT AEPB Inhale 1-2 puffs into the lungs every 6 (six) hours as needed. Patient taking differently: Inhale 2-3 puffs into the lungs as needed (cold symptoms). 09/22/19   Nche, Bonna Gains, NP  amLODipine (NORVASC) 5 MG tablet Take 1 tablet (5 mg total) by mouth daily. 10/26/22   Netta Corrigan, PA-C  Continuous Blood Gluc Sensor (FREESTYLE LIBRE 14 DAY SENSOR) MISC by Other route every fourteen (14) days. One libre sensor every 14 days 01/21/18   [provider]  etonogestrel (NEXPLANON) 68 MG IMPL implant 1 each by Subdermal route once. Implanted summer 2020    [provider]  Insulin Aspart (NOVOLOG IJ) Inject as directed. Inject before every meal and when blood sugar spikes per insulin pump.    [provider]  Insulin  Disposable Pump (OMNIPOD 5 G6 POD, GEN 5,) MISC SMARTSIG:SUB-Q Every 3 Days    [provider]  Insulin Pen Needle (ULTRA-THIN II SHORT PEN NEEDLE) 31G X 8 MM MISC Inject into the skin. 01/21/18   [provider]  lisinopril (ZESTRIL) 40 MG tablet Take 1 tablet (40 mg total) by mouth every morning. 10/26/22   Netta Corrigan, PA-C  metoCLOPramide (REGLAN) 10 MG tablet Take 1 tablet (10 mg total) by mouth every 6 (six) hours as needed for nausea. 01/10/23   Al Decant, PA-C  ondansetron (ZOFRAN) 4 MG tablet Take 1 tablet (4 mg total) by mouth every 6 (six) hours. 04/01/22   Wynetta Fines, MD  ondansetron (ZOFRAN-ODT) 4 MG disintegrating tablet Take 1 tablet (4 mg total) by mouth 2 (two) times daily as needed for nausea or vomiting. 10/26/22   Netta Corrigan, PA-C  pantoprazole (PROTONIX) 40 MG tablet Take 1 tablet (40 mg total) by mouth daily as needed (heart burn). Patient taking differently: Take 40 mg by mouth daily. 08/05/21   Rolly Salter, MD  PARoxetine (PAXIL) 20 MG tablet Take 20 mg by mouth daily. 07/13/21   [provider]  polyethylene glycol powder (GLYCOLAX/MIRALAX) 17 GM/SCOOP powder Take 17 g by mouth daily. Patient not taking: Reported on 09/04/2021 09/22/19   Nche, Bonna Gains, NP  promethazine (PHENERGAN) 25 MG tablet Take 25 mg by mouth every 8 (eight) hours as needed for nausea or vomiting.  [provider]  promethazine (PHENERGAN) 25 MG tablet Take 1 tablet (25 mg total) by mouth every 6 (six) hours as needed for nausea or vomiting. 04/01/22   Wynetta Fines, MD  ranitidine (ZANTAC) 75 MG tablet Take 75 mg by mouth daily as needed for heartburn.  06/15/20  [provider]      Allergies    Aleve [naproxen sodium]    Review of Systems   Review of Systems  Constitutional:  Negative for appetite change and fatigue.  HENT:  Negative for congestion, ear discharge and sinus pressure.   Eyes:  Negative for discharge.   Respiratory:  Negative for cough.   Cardiovascular:  Negative for chest pain.  Gastrointestinal:  Positive for abdominal pain and vomiting. Negative for diarrhea.  Genitourinary:  Negative for frequency and hematuria.  Musculoskeletal:  Negative for back pain.  Skin:  Negative for rash.  Neurological:  Negative for seizures and headaches.  Psychiatric/Behavioral:  Negative for hallucinations.     Physical Exam Updated Vital Signs BP (!) 154/97   Pulse (!) 120   Temp 98.1 F (36.7 C) (Oral)   Resp 18   Ht 5\' 4"  (1.626 m)   Wt 51 kg   LMP 03/08/2023   SpO2 100%   BMI 19.30 kg/m  Physical Exam Vitals and nursing note reviewed.  Constitutional:      Appearance: She is well-developed.  HENT:     Head: Normocephalic.     Nose: Nose normal.  Eyes:     General: No scleral icterus.    Conjunctiva/sclera: Conjunctivae normal.  Neck:     Thyroid: No thyromegaly.  Cardiovascular:     Rate and Rhythm: Normal rate and regular rhythm.     Heart sounds: No murmur heard.    No friction rub. No gallop.  Pulmonary:     Breath sounds: No stridor. No wheezing or rales.  Chest:     Chest wall: No tenderness.  Abdominal:     General: There is no distension.     Tenderness: There is no abdominal tenderness. There is no rebound.  Musculoskeletal:        General: Normal range of motion.     Cervical back: Neck supple.  Lymphadenopathy:     Cervical: No cervical adenopathy.  Skin:    Findings: No erythema or rash.  Neurological:     Mental Status: She is alert and oriented to person, place, and time.     Motor: No abnormal muscle tone.     Coordination: Coordination normal.  Psychiatric:        Behavior: Behavior normal.     ED Results / Procedures / Treatments   Labs (all labs ordered are listed, but only abnormal results are displayed) Labs Reviewed  CBC WITH DIFFERENTIAL/PLATELET - Abnormal; Notable for the following components:      Result Value   WBC 16.2 (*)     Neutro Abs 12.4 (*)    Abs Immature Granulocytes 0.10 (*)    All other components within normal limits  BASIC METABOLIC PANEL - Abnormal; Notable for the following components:   Sodium 133 (*)    CO2 17 (*)    Glucose, Bld 141 (*)    BUN 31 (*)    Creatinine, Ser 1.56 (*)    Calcium 8.4 (*)    GFR, Estimated 45 (*)    All other components within normal limits  BETA-HYDROXYBUTYRIC ACID - Abnormal; Notable for the following components:   Beta-Hydroxybutyric  Acid 4.90 (*)    All other components within normal limits  BLOOD GAS, VENOUS - Abnormal; Notable for the following components:   pCO2, Ven 42 (*)    Acid-base deficit 4.3 (*)    All other components within normal limits  BASIC METABOLIC PANEL - Abnormal; Notable for the following components:   Sodium 134 (*)    CO2 19 (*)    Glucose, Bld 178 (*)    BUN 27 (*)    Creatinine, Ser 1.45 (*)    Calcium 8.5 (*)    GFR, Estimated 49 (*)    All other components within normal limits  CBG MONITORING, ED - Abnormal; Notable for the following components:   Glucose-Capillary 165 (*)    All other components within normal limits  I-STAT CHEM 8, ED - Abnormal; Notable for the following components:   Sodium 134 (*)    BUN 31 (*)    Creatinine, Ser 1.70 (*)    Glucose, Bld 147 (*)    TCO2 19 (*)    All other components within normal limits  CBG MONITORING, ED - Abnormal; Notable for the following components:   Glucose-Capillary 114 (*)    All other components within normal limits  HCG, QUANTITATIVE, PREGNANCY  HCG, SERUM, QUALITATIVE  BASIC METABOLIC PANEL  BASIC METABOLIC PANEL  BASIC METABOLIC PANEL  BETA-HYDROXYBUTYRIC ACID  BASIC METABOLIC PANEL  BETA-HYDROXYBUTYRIC ACID  CBG MONITORING, ED    EKG None  Radiology DG Chest Port 1 View Result Date: 03/29/2023 CLINICAL DATA:  32 year old female with DKA. Abdominal pain and vomiting. EXAM: PORTABLE CHEST 1 VIEW COMPARISON:  Portable chest 10/26/2022. FINDINGS: Portable AP  semi upright view at 0930 hours. Lower lung volumes. Normal cardiac size and mediastinal contours. Visualized tracheal air column is within normal limits. Subtle increased basilar predominant pulmonary interstitium but otherwise Allowing for portable technique the lungs are clear. No pneumothorax or pleural effusion. Bowel gas subjacent to the left hemidiaphragm. No acute osseous abnormality identified. IMPRESSION: Mild pulmonary vascular congestion when compared to July, no other acute cardiopulmonary abnormality. Electronically Signed   By: Odessa Fleming M.D.   On: 03/29/2023 09:56    Procedures Procedures  {Document cardiac monitor, telemetry assessment procedure when appropriate:1}  Medications Ordered in ED Medications  insulin regular, human (MYXREDLIN) 100 units/ 100 mL infusion (has no administration in time range)  dextrose 50 % solution 0-50 mL (has no administration in time range)  sodium chloride flush (NS) 0.9 % injection 10 mL (10 mLs Intravenous Not Given 03/29/23 1104)  potassium chloride 10 mEq in 100 mL IVPB (has no administration in time range)  dextrose 5 % and 0.45 % NaCl infusion (has no administration in time range)  sodium chloride 0.9 % bolus 1,000 mL (0 mLs Intravenous Stopped 03/29/23 1128)  metoCLOPramide (REGLAN) injection 10 mg (10 mg Intravenous Given 03/29/23 0907)  pantoprazole (PROTONIX) injection 40 mg (40 mg Intravenous Given 03/29/23 0907)  sodium chloride 0.9 % bolus 1,000 mL (0 mLs Intravenous Stopped 03/29/23 1025)    ED Course/ Medical Decision Making/ A&P  Patient anion gap Was initially elevated on her i-STAT but was not elevated on her bemet.  She had a glucose of 141.  Patient was given Reglan and 2 L of fluids and her chemistries improved and she still did not help an elevated anion gap.  Patient did have an elevated beta hydroxybutyric acid.  Patient did not want to be admitted to the hospital.  She was able to eat  and drink without problems. {   Click  here for ABCD2, HEART and other calculatorsREFRESH Note before signing :1}                              Medical Decision Making Amount and/or Complexity of Data Reviewed Labs: ordered. Radiology: ordered.  Risk Prescription drug management.   Patient with vomiting, dehydration, diabetes.  She is discharged home after receiving IV fluids and able to keep food down.  Patient will follow-up with her PCP and return if getting worse  {Document critical care time when appropriate:1} {Document review of labs and clinical decision tools ie heart score, Chads2Vasc2 etc:1}  {Document your independent review of radiology images, and any outside records:1} {Document your discussion with family members, caretakers, and with consultants:1} {Document social determinants of health affecting pt's care:1} {Document your decision making why or why not admission, treatments were needed:1} Final Clinical Impression(s) / ED Diagnoses Final diagnoses:  Nausea and vomiting, unspecified vomiting type    Rx / DC Orders ED Discharge Orders     None

## 2023-03-29 NOTE — ED Notes (Signed)
Per provider, check CBG, give 1L NS bolus and give patient something to eat and recheck BMP.  Hold insulin and related orders

## 2023-03-29 NOTE — ED Notes (Signed)
Crackers and diet ginger ale given to patient per provider's instructions

## 2023-04-02 DIAGNOSIS — Z5941 Food insecurity: Secondary | ICD-10-CM | POA: Diagnosis not present

## 2023-04-02 DIAGNOSIS — E1065 Type 1 diabetes mellitus with hyperglycemia: Secondary | ICD-10-CM | POA: Diagnosis not present

## 2023-04-02 DIAGNOSIS — E109 Type 1 diabetes mellitus without complications: Secondary | ICD-10-CM | POA: Diagnosis not present

## 2023-04-02 DIAGNOSIS — Z794 Long term (current) use of insulin: Secondary | ICD-10-CM | POA: Diagnosis not present

## 2023-04-02 DIAGNOSIS — I10 Essential (primary) hypertension: Secondary | ICD-10-CM | POA: Diagnosis not present

## 2023-04-16 ENCOUNTER — Encounter (HOSPITAL_COMMUNITY): Payer: Self-pay

## 2023-04-16 ENCOUNTER — Emergency Department (HOSPITAL_COMMUNITY)
Admission: EM | Admit: 2023-04-16 | Discharge: 2023-04-17 | Disposition: A | Discharge status: Home / Self Care | Payer: 59 | Attending: Emergency Medicine | Admitting: Emergency Medicine

## 2023-04-16 DIAGNOSIS — I10 Essential (primary) hypertension: Secondary | ICD-10-CM | POA: Insufficient documentation

## 2023-04-16 DIAGNOSIS — Z8639 Personal history of other endocrine, nutritional and metabolic disease: Secondary | ICD-10-CM

## 2023-04-16 DIAGNOSIS — E1043 Type 1 diabetes mellitus with diabetic autonomic (poly)neuropathy: Secondary | ICD-10-CM | POA: Diagnosis not present

## 2023-04-16 DIAGNOSIS — J45909 Unspecified asthma, uncomplicated: Secondary | ICD-10-CM | POA: Diagnosis not present

## 2023-04-16 DIAGNOSIS — Z79899 Other long term (current) drug therapy: Secondary | ICD-10-CM | POA: Insufficient documentation

## 2023-04-16 DIAGNOSIS — R112 Nausea with vomiting, unspecified: Secondary | ICD-10-CM | POA: Diagnosis not present

## 2023-04-16 DIAGNOSIS — K3184 Gastroparesis: Secondary | ICD-10-CM | POA: Diagnosis not present

## 2023-04-16 LAB — COMPREHENSIVE METABOLIC PANEL
ALT: 11 U/L (ref 0–44)
AST: 22 U/L (ref 15–41)
Albumin: 5 g/dL (ref 3.5–5.0)
Alkaline Phosphatase: 86 U/L (ref 38–126)
Anion gap: 11 (ref 5–15)
BUN: 11 mg/dL (ref 6–20)
CO2: 22 mmol/L (ref 22–32)
Calcium: 10.4 mg/dL — ABNORMAL HIGH (ref 8.9–10.3)
Chloride: 102 mmol/L (ref 98–111)
Creatinine, Ser: 1.04 mg/dL — ABNORMAL HIGH (ref 0.44–1.00)
GFR, Estimated: >60 mL/min (ref >60)
Glucose, Bld: 173 mg/dL — ABNORMAL HIGH (ref 70–99)
Potassium: 4 mmol/L (ref 3.5–5.1)
Sodium: 135 mmol/L (ref 135–145)
Total Bilirubin: 0.7 mg/dL (ref 0.0–1.2)
Total Protein: 9.5 g/dL — ABNORMAL HIGH (ref 6.5–8.1)

## 2023-04-16 LAB — URINALYSIS, W/ REFLEX TO CULTURE (INFECTION SUSPECTED)
Bilirubin Urine: NEGATIVE
Glucose, UA: 50 mg/dL — AB
Hgb urine dipstick: NEGATIVE
Ketones, ur: 5 mg/dL — AB
Leukocytes,Ua: NEGATIVE
Nitrite: NEGATIVE
Protein, ur: >=300 mg/dL — AB
Specific Gravity, Urine: 1.012 (ref 1.005–1.030)
pH: 6 (ref 5.0–8.0)

## 2023-04-16 LAB — CBC WITH DIFFERENTIAL/PLATELET
Abs Immature Granulocytes: 0.03 10*3/uL (ref 0.00–0.07)
Basophils Absolute: 0.1 10*3/uL (ref 0.0–0.1)
Basophils Relative: 1 %
Eosinophils Absolute: 0 10*3/uL (ref 0.0–0.5)
Eosinophils Relative: 0 %
HCT: 49.6 % — ABNORMAL HIGH (ref 36.0–46.0)
Hemoglobin: 16.8 g/dL — ABNORMAL HIGH (ref 12.0–15.0)
Immature Granulocytes: 0 %
Lymphocytes Relative: 18 %
Lymphs Abs: 1.7 10*3/uL (ref 0.7–4.0)
MCH: 30.9 pg (ref 26.0–34.0)
MCHC: 33.9 g/dL (ref 30.0–36.0)
MCV: 91.3 fL (ref 80.0–100.0)
Monocytes Absolute: 0.3 10*3/uL (ref 0.1–1.0)
Monocytes Relative: 4 %
Neutro Abs: 7.1 10*3/uL (ref 1.7–7.7)
Neutrophils Relative %: 77 %
Platelets: 323 10*3/uL (ref 150–400)
RBC: 5.43 MIL/uL — ABNORMAL HIGH (ref 3.87–5.11)
RDW: 13.2 % (ref 11.5–15.5)
WBC: 9.2 10*3/uL (ref 4.0–10.5)
nRBC: 0 % (ref 0.0–0.2)

## 2023-04-16 LAB — CBG MONITORING, ED
Glucose-Capillary: 160 mg/dL — ABNORMAL HIGH (ref 70–99)
Glucose-Capillary: 152 mg/dL — ABNORMAL HIGH (ref 70–99)

## 2023-04-16 LAB — LIPASE, BLOOD: Lipase: 22 U/L (ref 11–51)

## 2023-04-16 LAB — PREGNANCY, URINE: Preg Test, Ur: NEGATIVE

## 2023-04-16 MED ORDER — METOCLOPRAMIDE HCL 5 MG/ML IJ SOLN
10.0000 mg | Freq: Once | INTRAMUSCULAR | Status: AC
  Administered 2023-04-17: 10 mg via INTRAVENOUS
  Filled 2023-04-16: qty 2

## 2023-04-16 MED ORDER — PANTOPRAZOLE SODIUM 40 MG IV SOLR
40.0000 mg | Freq: Once | INTRAVENOUS | Status: AC
  Administered 2023-04-17: 40 mg via INTRAVENOUS
  Filled 2023-04-16: qty 10

## 2023-04-16 MED ORDER — SODIUM CHLORIDE 0.9 % IV BOLUS
1000.0000 mL | Freq: Once | INTRAVENOUS | Status: DC
  Administered 2023-04-17: 1000 mL via INTRAVENOUS

## 2023-04-16 NOTE — ED Provider Triage Note (Signed)
 Emergency Medicine Provider Triage Evaluation Note  MARTHENA WHITMYER , a 32 y.o. female  was evaluated in triage.  Pt complains of severe nausea and vomiting.  Patient was seen earlier this month for the same.  She is a type 1 diabetic.  She reports her sugars have not been super high or low.   Review of Systems  Positive: As above Negative: As above  Physical Exam  BP (!) 120/104 (BP Location: Right Arm)   Pulse (!) 109   Temp 97.9 F (36.6 C) (Oral)   Resp 18   LMP 03/08/2023   SpO2 100%  Gen:   Awake, tearful Resp:  Normal effort  MSK:   Moves extremities without difficulty  Other:    Medical Decision Making  Medically screening exam initiated at 2:29 PM.  Appropriate orders placed.  Tate C Spranger was informed that the remainder of the evaluation will be completed by another provider, this initial triage assessment does not replace that evaluation, and the importance of remaining in the ED until their evaluation is complete.     Gretta Gerard SAUNDERS, PA-C 04/16/23 1431

## 2023-04-16 NOTE — ED Notes (Signed)
Nurse asked to obtain IV access. Nurse in MRI with patient at the time. IV will be inserted when staff returns.

## 2023-04-16 NOTE — ED Provider Notes (Signed)
 [place] EMERGENCY DEPARTMENT AT [facility] Provider Note   CSN: [id number] Arrival date & time: 04/16/23  1415     History  Chief Complaint  Patient presents with  . Vomiting    [name] is a 32 y.o. female with history of hypertension, type 1 diabetes, cannabinoid hyperemesis, diabetic gastroparesis, asthma, who presents the emergency department complaining of nausea and vomiting.  Patient states that she has been having symptoms intermittently for about a month.  Endorses unintentional weight loss as well. Not complaining of much pain right now.  States she was in bed all day yesterday and did not eat anything, and had no vomiting.  She went to work today and had taken a Zofran , and try to drink some ginger ale, which she started vomiting again.  She follows with gastroenterology, and states that she is supposed to have an endoscopy, but has not been scheduled yet.  She also follows with [facility], and states her last hemoglobin A1c was at 9%.  HPI     Home Medications Prior to Admission medications   Medication Sig Start Date End Date Taking? Authorizing Provider  Albuterol  Sulfate (PROAIR  RESPICLICK) 108 (90 Base) MCG/ACT AEPB Inhale 1-2 puffs into the lungs every 6 (six) hours as needed. Patient taking differently: Inhale 2-3 puffs into the lungs as needed (cold symptoms). 09/22/19   Nche, Roselie Rockford, NP  amLODipine  (NORVASC ) 5 MG tablet Take 1 tablet (5 mg total) by mouth daily. 10/26/22   Victor Lynwood DASEN, PA-C  Continuous Blood Gluc Sensor (FREESTYLE LIBRE 14 DAY SENSOR) MISC by Other route every fourteen (14) days. One libre sensor every 14 days 01/21/18   [provider]  etonogestrel (NEXPLANON) 68 MG IMPL implant 1 each by Subdermal route once. Implanted summer 2020    [provider]  Insulin  Aspart (NOVOLOG  IJ) Inject as directed. Inject before every meal and when blood sugar spikes per insulin  pump.    [provider]  Insulin  Disposable Pump (OMNIPOD 5 G6 POD, GEN 5,) MISC SMARTSIG:SUB-Q Every 3 Days    [provider]  Insulin  Pen Needle (ULTRA-THIN II SHORT PEN NEEDLE) 31G X 8 MM MISC Inject into the skin. 01/21/18   [provider]  lisinopril  (ZESTRIL ) 40 MG tablet Take 1 tablet (40 mg total) by mouth every morning. 10/26/22   Victor Lynwood DASEN, PA-C  metoCLOPramide  (REGLAN ) 10 MG tablet Take 1 tablet (10 mg total) by mouth every 6 (six) hours as needed for nausea. 01/10/23   Ruthell Lonni FALCON, PA-C  ondansetron  (ZOFRAN ) 4 MG tablet Take 1 tablet (4 mg total) by mouth every 6 (six) hours. 04/01/22   Laurice Maude BROCKS, MD  ondansetron  (ZOFRAN -ODT) 4 MG disintegrating tablet Take 1 tablet (4 mg total) by mouth 2 (two) times daily as needed for nausea or vomiting. 10/26/22   Victor Lynwood DASEN, PA-C  pantoprazole  (PROTONIX ) 40 MG tablet Take 1 tablet (40 mg total) by mouth daily as needed (heart burn). Patient taking differently: Take 40 mg by mouth daily. 08/05/21   Tobie Yetta HERO, MD  PARoxetine  (PAXIL ) 20 MG tablet Take 20 mg by mouth daily. 07/13/21   [provider]  polyethylene glycol powder (GLYCOLAX /MIRALAX ) 17 GM/SCOOP powder Take 17 g by mouth daily. Patient not taking: Reported on 09/04/2021 09/22/19   Nche, Roselie Rockford, NP  promethazine  (PHENERGAN ) 25 MG tablet Take 25 mg by mouth every 8 (eight) hours as needed for nausea or vomiting.    [provider]  promethazine  (PHENERGAN ) 25 MG tablet Take 1 tablet (25 mg total) by mouth every 6 (six) hours as needed for nausea or vomiting. 04/01/22   Laurice Maude BROCKS, MD  ranitidine (ZANTAC) 75 MG tablet Take 75 mg by mouth daily as needed for heartburn.  06/15/20  [provider]      Allergies    Aleve [naproxen sodium]    Review of Systems   Review of Systems  Gastrointestinal:  Positive for nausea and vomiting.  All other systems reviewed and are negative.   Physical Exam Updated Vital  Signs BP 117/84 (BP Location: Right Arm)   Pulse (!) 105   Temp 98.5 F (36.9 C) (Oral)   Resp 18   LMP 03/08/2023 (Approximate)   SpO2 100%  Physical Exam Vitals and nursing note reviewed.  Constitutional:      Appearance: Normal appearance.  HENT:     Head: Normocephalic and atraumatic.  Eyes:     Conjunctiva/sclera: Conjunctivae normal.  Cardiovascular:     Rate and Rhythm: Regular rhythm. Tachycardia present.  Pulmonary:     Effort: Pulmonary effort is normal. No respiratory distress.     Breath sounds: Normal breath sounds.  Abdominal:     General: There is no distension.     Palpations: Abdomen is soft.     Tenderness: There is no abdominal tenderness.  Skin:    General: Skin is warm and dry.  Neurological:     General: No focal deficit present.     Mental Status: She is alert.     ED Results / Procedures / Treatments   Labs (all labs ordered are listed, but only abnormal results are displayed) Labs Reviewed  COMPREHENSIVE METABOLIC PANEL - Abnormal; Notable for the following components:      Result Value   Glucose, Bld 173 (*)    Creatinine, Ser 1.04 (*)    Calcium 10.4 (*)    Total Protein 9.5 (*)    All other components within normal limits  CBC WITH DIFFERENTIAL/PLATELET - Abnormal; Notable for the following components:   RBC 5.43 (*)    Hemoglobin 16.8 (*)    HCT 49.6 (*)    All other components within normal limits  CBG MONITORING, ED - Abnormal; Notable for the following components:   Glucose-Capillary 160 (*)    All other components within normal limits  CBG MONITORING, ED - Abnormal; Notable for the following components:   Glucose-Capillary 152 (*)    All other components within normal limits  LIPASE, BLOOD  URINALYSIS, W/ REFLEX TO CULTURE (INFECTION SUSPECTED)  PREGNANCY, URINE    EKG None  Radiology No results found.  Procedures Procedures    Medications Ordered in ED Medications  sodium chloride  0.9 % bolus 1,000 mL (has no  administration in time range)  metoCLOPramide  (REGLAN ) injection 10 mg (has no administration in time range)  pantoprazole  (PROTONIX ) injection 40 mg (has no administration in time range)    ED Course/ Medical Decision Making/ A&P Clinical Course as of 04/16/23 2302  Tue Apr 16, 2023  2300 Gastroparesis, meds and PO challenge [AS]    Clinical Course User Index [AS] Edwardo Marsa HERO, PA-C                                 Medical Decision Making Risk Prescription drug management.  This patient is a 32 y.o. female  who presents to the ED  for concern of vomiting.   Differential diagnoses prior to evaluation: The emergent differential diagnosis includes, but is not limited to,  ACS/MI, Boerhaave's, DKA, elevated ICP, Ischemic bowel, Sepsis, Drug-related (toxicity, THC hyperemesis, ETOH, withdrawal), Appendicitis, Bowel obstruction, Electrolyte abnormalities, Pancreatitis, Biliary colic, Gastroenteritis, Gastroparesis, Hepatitis, Migraine, Thyroid  disease, Renal colic, GERD/PUD, UTI, Ovarian torsion, Pregnancy, Hyperemesis gravidarum. This is not an exhaustive differential.   Past Medical History / Co-morbidities / Social History: Hypertension, type 1 diabetes, cannabinoid hyperemesis, diabetic gastroparesis, asthma  Additional history: Chart reviewed. Pertinent results include: Reviewed ER visit record from 12/13 in which patient presented for similar symptoms.  She had resolution of her symptoms after IV fluids and Reglan .  Passed p.o. challenge.  Physical Exam: Physical exam performed. The pertinent findings include: Mildly tachycardic, otherwise normal vital signs.  No acute distress.  Abdomen soft and nontender.  Lab Tests/Imaging studies: I personally interpreted labs/imaging and the pertinent results include: No leukocytosis, hemoglobin 16.8, likely hemoconcentrated due to dehydration.  CMP with glucose 173, creatinine 1.04, improved compared to prior.  Normal lipase.  I  considered abdominal imaging for this patient, however with a benign physical exam and reassuring laboratory evaluation, very low concern for surgical process.  Patient has also had normal CT scans with similar symptoms recently.  Will defer imaging at this time.  Medications: I ordered medication including IVF, Reglan , Protonix .  I have reviewed the patients home medicines and have made adjustments as needed.   Disposition: Patient discussed and care transferred to [facility] PA-C at shift change. Please see his/her note for further details regarding further ED course and disposition. Plan at time of handoff is follow up on UA and follow up PO challenge after IVF and anti-emetics. Anticipate patient can dc to home with GI follow up regarding chronic stomach upset and vomiting.    Final Clinical Impression(s) / ED Diagnoses Final diagnoses:  Nausea and vomiting, unspecified vomiting type  History of diabetic gastroparesis    Rx / DC Orders ED Discharge Orders     None      Portions of this report may have been transcribed using voice recognition software. Every effort was made to ensure accuracy; however, inadvertent computerized transcription errors may be present.    Dajanique Robley T, PA-C 04/16/23 2302    Steinl, Kevin, MD 04/22/23 [phone]

## 2023-04-16 NOTE — ED Triage Notes (Signed)
Pt presents with c/o vomiting off and on for one month. Pt has diabetes, type I. Pt also reports that she has been losing weight.

## 2023-04-16 NOTE — ED Notes (Signed)
Staff to room now to attempt IV access

## 2023-04-16 NOTE — ED Provider Notes (Signed)
  Physical Exam  BP 117/84 (BP Location: Right Arm)   Pulse (!) 105   Temp 98.5 F (36.9 C) (Oral)   Resp 18   LMP 03/08/2023 (Approximate)   SpO2 100%   Physical Exam Vitals and nursing note reviewed.  Constitutional:      General: She is not in acute distress.    Appearance: Normal appearance. She is normal weight. She is not ill-appearing.     Comments: Resting comfortably in bed  HENT:     Head: Normocephalic and atraumatic.  Pulmonary:     Effort: Pulmonary effort is normal. No respiratory distress.  Abdominal:     General: Abdomen is flat.  Musculoskeletal:        General: Normal range of motion.     Cervical back: Neck supple.  Skin:    General: Skin is warm and dry.  Neurological:     Mental Status: She is alert and oriented to person, place, and time.  Psychiatric:        Mood and Affect: Mood normal.        Behavior: Behavior normal.     Procedures  Procedures  ED Course / MDM   Clinical Course as of 04/17/23 0102  Tue Apr 16, 2023  2300 Gastroparesis, meds and PO challenge [AS]    Clinical Course User Index [AS] Edwardo Marsa HERO, PA-C   Medical Decision Making Amount and/or Complexity of Data Reviewed Labs: ordered.  Risk Prescription drug management.  Care assumed at shift change from previous provider.  Please see previous note for full HPI.  In short, 32 year old female with a history of gastroparesis presenting for evaluation of nausea and vomiting.  Symptoms intermittent over the past month.  Lab workup reassuring.  Plan is for changes to reassess after medications in the emergency department.  If able to tolerate p.o., will likely be stable for discharge home with PCP follow-up.  0100-patient reports improvement in her symptoms after treatment in the emergency department.  She was able to tolerate oral intake without difficulty.  States that she typically treats her symptoms with rectal Phenergan  if Zofran  does not work but did not have  access today due to being at work.  Reports improvement with Reglan  in the past as well.  Does not currently have prescription for this.  Will send prescription and educated to only use 1 antiemetic at a time.  She follows with gastroenterology for gastroparesis.  She was encouraged to follow-up as scheduled.  She was given return precautions.  Stable at discharge.  At this time there does not appear to be any evidence of an acute emergency medical condition and the patient appears stable for discharge with appropriate outpatient follow up. Diagnosis was discussed with patient who verbalizes understanding of care plan and is agreeable to discharge. I have discussed return precautions with patient who verbalizes understanding. Patient encouraged to follow-up with their PCP within 1 week. All questions answered.  Note: Portions of this report may have been transcribed using voice recognition software. Every effort was made to ensure accuracy; however, inadvertent computerized transcription errors may still be present.   Edwardo Marsa HERO, PA-C 04/17/23 0102    Trine Raynell Moder, MD 04/17/23 [phone]

## 2023-04-17 DIAGNOSIS — R112 Nausea with vomiting, unspecified: Secondary | ICD-10-CM | POA: Diagnosis not present

## 2023-04-17 MED ORDER — METOCLOPRAMIDE HCL 10 MG PO TABS
10.0000 mg | ORAL_TABLET | Freq: Four times a day (QID) | ORAL | 0 refills | Status: DC
Start: 2023-04-17 — End: 2023-09-03

## 2023-04-17 NOTE — ED Notes (Signed)
 [place] PIV placed.

## 2023-04-17 NOTE — Discharge Instructions (Addendum)
 You have been seen today for your complaint of nausea and vomiting. Your lab work was reassuring. Your discharge medications include Reglan .  This is a nausea medicine.  Only take 1 nausea medicine at this time, do not mix Reglan  with Zofran  or Phenergan . Follow up with: Your GI doctor Please seek immediate medical care if you develop any of the following symptoms: You have pain in your chest, neck, arm, or jaw. Your heart is beating very quickly. You have trouble breathing or you are breathing very quickly. You feel extremely weak or you faint. Your skin feels cold and clammy. You feel confused. You have persistent vomiting. You have vomit that is bright red or looks like black coffee grounds. You have stools (feces) that are bloody or black, or stools that look like tar. You have a severe headache, a stiff neck, or both. You have severe pain, cramping, or bloating in your abdomen. You have signs of dehydration, such as: Dark urine, very little urine, or no urine. Cracked lips. Dry mouth. Sunken eyes. Sleepiness. Weakness. At this time there does not appear to be the presence of an emergent medical condition, however there is always the potential for conditions to change. Please read and follow the below instructions.  Do not take your medicine if  develop an itchy rash, swelling in your mouth or lips, or difficulty breathing; call 911 and seek immediate emergency medical attention if this occurs.  You may review your lab tests and imaging results in their entirety on your MyChart account.  Please discuss all results of fully with your primary care provider and other specialist at your follow-up visit.  Note: Portions of this text may have been transcribed using voice recognition software. Every effort was made to ensure accuracy; however, inadvertent computerized transcription errors may still be present.

## 2023-05-14 DIAGNOSIS — R1013 Epigastric pain: Secondary | ICD-10-CM | POA: Diagnosis not present

## 2023-05-14 DIAGNOSIS — K219 Gastro-esophageal reflux disease without esophagitis: Secondary | ICD-10-CM | POA: Diagnosis not present

## 2023-05-14 DIAGNOSIS — R1115 Cyclical vomiting syndrome unrelated to migraine: Secondary | ICD-10-CM | POA: Diagnosis not present

## 2023-05-14 DIAGNOSIS — R1114 Bilious vomiting: Secondary | ICD-10-CM | POA: Diagnosis not present

## 2023-07-09 DIAGNOSIS — I10 Essential (primary) hypertension: Secondary | ICD-10-CM | POA: Diagnosis not present

## 2023-07-09 DIAGNOSIS — E1065 Type 1 diabetes mellitus with hyperglycemia: Secondary | ICD-10-CM | POA: Diagnosis not present

## 2023-08-07 ENCOUNTER — Ambulatory Visit (INDEPENDENT_AMBULATORY_CARE_PROVIDER_SITE_OTHER)

## 2023-08-07 ENCOUNTER — Encounter: Payer: Self-pay | Admitting: Podiatry

## 2023-08-07 ENCOUNTER — Ambulatory Visit (INDEPENDENT_AMBULATORY_CARE_PROVIDER_SITE_OTHER): Admitting: Podiatry

## 2023-08-07 DIAGNOSIS — M7751 Other enthesopathy of right foot: Secondary | ICD-10-CM

## 2023-08-07 DIAGNOSIS — S92424A Nondisplaced fracture of distal phalanx of right great toe, initial encounter for closed fracture: Secondary | ICD-10-CM

## 2023-08-07 NOTE — Progress Notes (Signed)
  Subjective:  Patient ID: Unknown [name], female    DOB: [birth year],   MRN: [id number]  No chief complaint on file.   33 y.o. female presents for concern of injury to right foot that occurred on April 7th while at work. Relates a folded table fell on her foot. She was seen in ED and told no fracture but has continued to have pain and swelling. Relates the pain has improved. Concerned for continued swelling.  She is a type 1 Diabetic. Relates burning and tingling in their feet. Patient is diabetic and last A1c was  Lab Results  Component Value Date   HGBA1C 7.9 (H) 08/05/2021   .   PCP:  [facility]    . Denies any other pedal complaints. Denies n/v/f/c.   Past Medical History:  Diagnosis Date   Asthma    Diabetes mellitus    type 1    Diabetic gastroparesis (HCC)    Essential hypertension, benign 02/10/2016    Objective:  Physical Exam: Vascular: DP/PT pulses 2/4 bilateral. CFT <3 seconds. Normal hair growth on digits. No edema.  Skin. No lacerations or abrasions bilateral feet. No open ulceration or concerns for infection currently.  Musculoskeletal: MMT 5/5 bilateral lower extremities in DF, PF, Inversion and Eversion. Deceased ROM in DF of ankle joint. Erythema and edema noted to right great toe. Tender to proximal phaland area and pain with ROM of the MPJ and IPJ.  Neurological: Sensation intact to light touch.   Assessment:   1. Closed nondisplaced fracture of distal phalanx of right great toe, initial encounter      Plan:  Patient was evaluated and treated and all questions answered. -Xrays reviewed. Does appear to be fracture through distal neck of proximal phalanx non displaced and appears to have signs of healing.  -Discussed treatement options for toe fracture; risks, alternatives, and benefits explained. -Discussed taping of toe.  -Dispensed surgical shoe. Patient to wear at all times and instructed on use -Recommend protection,  rest, ice, elevation daily until symptoms improve -Anti-inflammatories as needed -Patient to return to office in 3 weeks for serial x-rays to assess healing  or sooner if condition worsens.   Jennefer Moats, DPM

## 2023-08-26 ENCOUNTER — Ambulatory Visit (INDEPENDENT_AMBULATORY_CARE_PROVIDER_SITE_OTHER)

## 2023-08-26 ENCOUNTER — Ambulatory Visit (INDEPENDENT_AMBULATORY_CARE_PROVIDER_SITE_OTHER): Admitting: Podiatry

## 2023-08-26 DIAGNOSIS — S92424A Nondisplaced fracture of distal phalanx of right great toe, initial encounter for closed fracture: Secondary | ICD-10-CM | POA: Diagnosis not present

## 2023-08-26 NOTE — Progress Notes (Signed)
  Subjective:  Patient ID: Unknown [name], female    DOB: [birth year],   MRN: [id number]  No chief complaint on file.   33 y.o. female presents for follow-up of right great toe fracture. Relates pain is better. Relates some itching and scaling around the toe.    History: Patient with concern of injury to right foot that occurred on April 7th while at work. Relates a folded table fell on her foot. She was seen in ED and told no fracture but has continued to have pain and swelling. Relates the pain has improved. Concerned for continued swelling.  She is a type 1 Diabetic. Relates burning and tingling in their feet. Patient is diabetic and last A1c was  Lab Results  Component Value Date   HGBA1C 7.9 (H) 08/05/2021   .   PCP:  [facility]    . Denies any other pedal complaints. Denies n/v/f/c.   Past Medical History:  Diagnosis Date   Asthma    Diabetes mellitus    type 1    Diabetic gastroparesis (HCC)    Essential hypertension, benign 02/10/2016    Objective:  Physical Exam: Vascular: DP/PT pulses 2/4 bilateral. CFT <3 seconds. Normal hair growth on digits. No edema.  Skin. No lacerations or abrasions bilateral feet. No open ulceration or concerns for infection currently.  Musculoskeletal: MMT 5/5 bilateral lower extremities in DF, PF, Inversion and Eversion. Deceased ROM in DF of ankle joint. Erythema and edema noted to right great toe. Tender to proximal phaland area and pain with ROM of the MPJ and IPJ.  Neurological: Sensation intact to light touch.   Assessment:   1. Closed nondisplaced fracture of distal phalanx of right great toe, initial encounter      Plan:  Patient was evaluated and treated and all questions answered. -Xrays reviewed. Does appear to be fracture through distal neck of proximal phalanx non displaced and appears to have signs of healing.  -Discussed treatement options for toe fracture; risks, alternatives, and benefits  explained. -Discussed taping of toe.  -May continue WBAT in regular shoes.  -Recommend protection, rest, ice, elevation daily until symptoms improve -Anti-inflammatories as needed -Patient to return to office in 6 months for DM foot check.   Jennefer Moats, DPM

## 2023-09-03 ENCOUNTER — Emergency Department (HOSPITAL_COMMUNITY)
Admission: EM | Admit: 2023-09-03 | Discharge: 2023-09-03 | Disposition: A | Discharge status: Home / Self Care | Attending: Emergency Medicine | Admitting: Emergency Medicine

## 2023-09-03 ENCOUNTER — Encounter (HOSPITAL_COMMUNITY): Payer: Self-pay

## 2023-09-03 DIAGNOSIS — E1043 Type 1 diabetes mellitus with diabetic autonomic (poly)neuropathy: Secondary | ICD-10-CM | POA: Diagnosis not present

## 2023-09-03 DIAGNOSIS — E1065 Type 1 diabetes mellitus with hyperglycemia: Secondary | ICD-10-CM | POA: Diagnosis not present

## 2023-09-03 DIAGNOSIS — Z794 Long term (current) use of insulin: Secondary | ICD-10-CM | POA: Insufficient documentation

## 2023-09-03 DIAGNOSIS — R000 Tachycardia, unspecified: Secondary | ICD-10-CM | POA: Diagnosis not present

## 2023-09-03 DIAGNOSIS — R1110 Vomiting, unspecified: Secondary | ICD-10-CM | POA: Diagnosis present

## 2023-09-03 DIAGNOSIS — J45909 Unspecified asthma, uncomplicated: Secondary | ICD-10-CM | POA: Insufficient documentation

## 2023-09-03 DIAGNOSIS — I10 Essential (primary) hypertension: Secondary | ICD-10-CM | POA: Insufficient documentation

## 2023-09-03 DIAGNOSIS — E1143 Type 2 diabetes mellitus with diabetic autonomic (poly)neuropathy: Secondary | ICD-10-CM

## 2023-09-03 DIAGNOSIS — Z720 Tobacco use: Secondary | ICD-10-CM | POA: Insufficient documentation

## 2023-09-03 DIAGNOSIS — R112 Nausea with vomiting, unspecified: Secondary | ICD-10-CM

## 2023-09-03 LAB — URINALYSIS, ROUTINE W REFLEX MICROSCOPIC
Bilirubin Urine: NEGATIVE
Glucose, UA: NEGATIVE mg/dL
Ketones, ur: 20 mg/dL — AB
Leukocytes,Ua: NEGATIVE
Nitrite: NEGATIVE
Protein, ur: 100 mg/dL — AB
RBC / HPF: >50 RBC/hpf (ref 0–5)
Specific Gravity, Urine: 1.016 (ref 1.005–1.030)
pH: 5 (ref 5.0–8.0)

## 2023-09-03 LAB — CBG MONITORING, ED: Glucose-Capillary: 180 mg/dL — ABNORMAL HIGH (ref 70–99)

## 2023-09-03 LAB — COMPREHENSIVE METABOLIC PANEL WITH GFR
ALT: 8 U/L (ref 0–44)
AST: 17 U/L (ref 15–41)
Albumin: 3.5 g/dL (ref 3.5–5.0)
Alkaline Phosphatase: 74 U/L (ref 38–126)
Anion gap: 13 (ref 5–15)
BUN: 15 mg/dL (ref 6–20)
CO2: 19 mmol/L — ABNORMAL LOW (ref 22–32)
Calcium: 9.1 mg/dL (ref 8.9–10.3)
Chloride: 108 mmol/L (ref 98–111)
Creatinine, Ser: 1.07 mg/dL — ABNORMAL HIGH (ref 0.44–1.00)
GFR, Estimated: >60 mL/min (ref >60)
Glucose, Bld: 158 mg/dL — ABNORMAL HIGH (ref 70–99)
Potassium: 3.8 mmol/L (ref 3.5–5.1)
Sodium: 140 mmol/L (ref 135–145)
Total Bilirubin: 0.8 mg/dL (ref 0.0–1.2)
Total Protein: 7 g/dL (ref 6.5–8.1)

## 2023-09-03 LAB — CBC
HCT: 40.5 % (ref 36.0–46.0)
Hemoglobin: 13.4 g/dL (ref 12.0–15.0)
MCH: 30.9 pg (ref 26.0–34.0)
MCHC: 33.1 g/dL (ref 30.0–36.0)
MCV: 93.3 fL (ref 80.0–100.0)
Platelets: 284 10*3/uL (ref 150–400)
RBC: 4.34 MIL/uL (ref 3.87–5.11)
RDW: 12.8 % (ref 11.5–15.5)
WBC: 7.4 10*3/uL (ref 4.0–10.5)
nRBC: 0 % (ref 0.0–0.2)

## 2023-09-03 LAB — LIPASE, BLOOD: Lipase: 20 U/L (ref 11–51)

## 2023-09-03 LAB — HCG, SERUM, QUALITATIVE: Preg, Serum: NEGATIVE

## 2023-09-03 MED ORDER — METOCLOPRAMIDE HCL 5 MG/ML IJ SOLN
10.0000 mg | Freq: Once | INTRAMUSCULAR | Status: AC
  Administered 2023-09-03: 10 mg via INTRAVENOUS
  Filled 2023-09-03: qty 2

## 2023-09-03 MED ORDER — ONDANSETRON HCL 4 MG/2ML IJ SOLN
4.0000 mg | Freq: Once | INTRAMUSCULAR | Status: AC
  Administered 2023-09-03: 4 mg via INTRAVENOUS
  Filled 2023-09-03: qty 2

## 2023-09-03 MED ORDER — METOCLOPRAMIDE HCL 10 MG PO TABS: 10.0000 mg | ORAL_TABLET | Freq: Three times a day (TID) | ORAL | 0 refills | Status: AC | PRN

## 2023-09-03 MED ORDER — LACTATED RINGERS IV BOLUS
1000.0000 mL | Freq: Once | INTRAVENOUS | Status: AC
  Administered 2023-09-03: 1000 mL via INTRAVENOUS

## 2023-09-03 MED ORDER — PANTOPRAZOLE SODIUM 40 MG IV SOLR
40.0000 mg | Freq: Once | INTRAVENOUS | Status: AC
  Administered 2023-09-03: 40 mg via INTRAVENOUS
  Filled 2023-09-03: qty 10

## 2023-09-03 NOTE — ED Provider Notes (Signed)
 [place] EMERGENCY DEPARTMENT AT [facility] Provider Note   CSN: [id number] Arrival date & time: 09/03/23  1441     History  Chief Complaint  Patient presents with   Abdominal Pain   Emesis    [name] is a 33 y.o. female with a past medical history of T1DM, HTN, asthma, tobacco use, diabetic gastroparesis presents emergency department for evaluation of abdominal cramping, vomiting over the past 5 days.  Has had difficulty with p.o. intake as she vomits following eating.  Has Zofran  at home but has been unable to keep them down due to vomiting.  Had promethazine  suppository but has not tried this.  Reports that this feels similar to gastroparesis episodes in the past.  Denies diarrhea, fevers.   Abdominal Pain Associated symptoms: vomiting   Emesis Associated symptoms: abdominal pain        Home Medications Prior to Admission medications   Medication Sig Start Date End Date Taking? Authorizing Provider  metoCLOPramide  (REGLAN ) 10 MG tablet Take 1 tablet (10 mg total) by mouth every 8 (eight) hours as needed for up to 10 days for nausea. 09/03/23 09/13/23 Yes Royann Cords, PA  Albuterol  Sulfate (PROAIR  RESPICLICK) 108 (90 Base) MCG/ACT AEPB Inhale 1-2 puffs into the lungs every 6 (six) hours as needed. Patient taking differently: Inhale 2-3 puffs into the lungs as needed (cold symptoms). 09/22/19   Nche, Connye Delaine, NP  amitriptyline (ELAVIL) 25 MG tablet Take 50 mg by mouth at bedtime. 05/14/23 05/13/24  [provider]  Continuous Blood Gluc Sensor (FREESTYLE LIBRE 14 DAY SENSOR) MISC by Other route every fourteen (14) days. One libre sensor every 14 days 01/21/18   [provider]  etonogestrel (NEXPLANON) 68 MG IMPL implant 1 each by Subdermal route once. Implanted summer 2020    [provider]  Insulin  Aspart (NOVOLOG  IJ) Inject as directed. Inject before every meal and when blood sugar spikes per insulin  pump.    [provider]  Insulin  Disposable Pump (OMNIPOD 5 G6 POD, GEN 5,) MISC SMARTSIG:SUB-Q Every 3 Days    [provider]  Insulin  Pen Needle (ULTRA-THIN II SHORT PEN NEEDLE) 31G X 8 MM MISC Inject into the skin. 01/21/18   [provider]  ondansetron  (ZOFRAN -ODT) 8 MG disintegrating tablet Take 8 mg by mouth every 8 (eight) hours as needed.    [provider]  pantoprazole  (PROTONIX ) 40 MG tablet Take 1 tablet (40 mg total) by mouth daily as needed (heart burn). Patient taking differently: Take 40 mg by mouth daily. 08/05/21   Patel, Pranav M, MD  ranitidine (ZANTAC) 75 MG tablet Take 75 mg by mouth daily as needed for heartburn.  06/15/20  [provider]      Allergies    Aleve [naproxen sodium]    Review of Systems   Review of Systems  Gastrointestinal:  Positive for abdominal pain and vomiting.    Physical Exam Updated Vital Signs BP (!) 139/98   Pulse 100   Temp 98.3 F (36.8 C) (Oral)   Resp 16   Ht 5\' 4"  (1.626 m)   Wt 54.4 kg   SpO2 100%   BMI 20.60 kg/m  Physical Exam Vitals and nursing note reviewed.  Constitutional:      General: She is not in acute distress.    Appearance: Normal appearance.     Comments: Uncomfortable appearing secondary to abdominal pain  HENT:     Head: Normocephalic and atraumatic.  Eyes:  Conjunctiva/sclera: Conjunctivae normal.  Cardiovascular:     Rate and Rhythm: Tachycardia present.     Comments: Mild tachycardia of 120bpm that decreased to 100 following IVF and analgesia Pulmonary:     Effort: Pulmonary effort is normal. No respiratory distress.     Breath sounds: Normal breath sounds.  Chest:     Chest wall: No tenderness.  Abdominal:     General: Bowel sounds are normal.     Palpations: Abdomen is soft.     Tenderness: There is no abdominal tenderness. There is no right CVA tenderness, left CVA tenderness, guarding or rebound.     Comments: No peritoneal signs. Nonsurgical abdomen   Musculoskeletal:     Right lower leg: No edema.     Left lower leg: No edema.  Skin:    Coloration: Skin is not jaundiced or pale.  Neurological:     Mental Status: She is alert. Mental status is at baseline.     ED Results / Procedures / Treatments   Labs (all labs ordered are listed, but only abnormal results are displayed) Labs Reviewed  COMPREHENSIVE METABOLIC PANEL WITH GFR - Abnormal; Notable for the following components:      Result Value   CO2 19 (*)    Glucose, Bld 158 (*)    Creatinine, Ser 1.07 (*)    All other components within normal limits  URINALYSIS, ROUTINE W REFLEX MICROSCOPIC - Abnormal; Notable for the following components:   APPearance HAZY (*)    Hgb urine dipstick LARGE (*)    Ketones, ur 20 (*)    Protein, ur 100 (*)    Bacteria, UA RARE (*)    All other components within normal limits  CBG MONITORING, ED - Abnormal; Notable for the following components:   Glucose-Capillary 180 (*)    All other components within normal limits  LIPASE, BLOOD  CBC  HCG, SERUM, QUALITATIVE    EKG EKG Interpretation Date/Time:  Tuesday Sep 03 2023 14:57:44 EDT Ventricular Rate:  116 PR Interval:  128 QRS Duration:  89 QT Interval:  325 QTC Calculation: 452 R Axis:   -80  Text Interpretation: Sinus tachycardia Right atrial enlargement Left anterior fascicular block Confirmed by Kommor, Madison ([id number]) on 09/04/2023 9:16:56 AM  Radiology No results found.  Procedures Procedures    Medications Ordered in ED Medications  ondansetron  (ZOFRAN ) injection 4 mg (4 mg Intravenous Given 09/03/23 1604)  lactated ringers  bolus 1,000 mL (0 mLs Intravenous Stopped 09/03/23 1837)  metoCLOPramide  (REGLAN ) injection 10 mg (10 mg Intravenous Given 09/03/23 1604)  pantoprazole  (PROTONIX ) injection 40 mg (40 mg Intravenous Given 09/03/23 1604)    ED Course/ Medical Decision Making/ A&P Clinical Course as of 09/05/23 0007  Tue Sep 03, 2023  1848 RBC / HPF: >50 Currently on  menses [LB]  1849 Glucose-Capillary(!): 180 Anion gap WNL [LB]    Clinical Course User Index [LB] Royann Cords, PA                                 Medical Decision Making Amount and/or Complexity of Data Reviewed Labs: ordered. Decision-making details documented in ED Course.  Risk Prescription drug management.   Patient presents to the ED for concern of abdominal pain, nausea, vomiting, this involves an extensive number of treatment options, and is a complaint that carries with it a high risk of complications and morbidity.  The differential diagnosis includes gastroparesis, gallbladder pathology,  appendicitis, diverticulitis, colitis, gastroenteritis, food poisoning, DKA, hypoglycemia   Co morbidities that complicate the patient evaluation  Diabetic gastroparesis   Additional history obtained:  Additional history obtained from  Nursing and Outside Medical Records   External records from outside source obtained and reviewed including triage RN note, ED note from 04/16/2023 and 03/29/2023 regarding gastroparesis   Lab Tests:  I Ordered, and personally interpreted labs.  The pertinent results include:   UA positive for large Hgb, 20 ketones, 100 protein, RBC, 11-20 WBC, rare bacteria--currently on menses CBG 157 Creatinine 1.07     Medicines ordered and prescription drug management:  I ordered medication including reglan , LR, Protonix  for gastroparesis Reevaluation of the patient after these medicines showed that the patient resolved I have reviewed the patients home medicines and have made adjustments as needed     Problem List / ED Course:  Diabetic gastroparesis N/V No abdominal tenderness on exam.  Nonsurgical abdomen.  As this feels similar to previous gastroparesis episodes in the past with no peritoneal signs nor severe lab abnormalities, do not feel that imaging was required at this time Symptoms completely resolved with Reglan , LR, Protonix  Will  provide short Reglan  course as patient does not have any more at home We will have patient follow-up with GI, PCP regarding further management   Reevaluation:  After the interventions noted above, I reevaluated the patient and found that they have :resolved    Dispostion:  After consideration of the diagnostic results and the patients response to treatment, I feel that the patent would benefit from outpatient management symptomatic care.   Discussed ED workup, disposition, return to ED precautions with patient who expresses understanding agrees with plan.  All questions answered to their satisfaction.  They are agreeable to plan.  Discharge instructions provided on paperwork  Final Clinical Impression(s) / ED Diagnoses Final diagnoses:  Diabetic gastroparesis (HCC)  Nausea and vomiting, unspecified vomiting type    Rx / DC Orders ED Discharge Orders          Ordered    metoCLOPramide  (REGLAN ) 10 MG tablet  Every 8 hours PRN        09/03/23 1833              Royann Cords, PA 09/05/23 0007    Lind Repine, MD 09/05/23 1400

## 2023-09-03 NOTE — ED Triage Notes (Signed)
 Pt arrives via POV. Pt reports abdominal pain, nausea, and intermittent vomiting since Friday. She reports her blood sugar was reading High on Friday. She reports her insulin  pump was messed up around that time. She states she has gotten her bg down to the 100s but her symptoms have not resolved. Pt is AxOx4.

## 2023-09-03 NOTE — Discharge Instructions (Addendum)
 Thank for letting us  evaluate you today.  All your lab work was unremarkable.  Your sugar was 180.  No signs of DKA.  We have improved your symptoms with Protonix , Reglan , Zofran , IV fluids.  I have sent Reglan  to your pharmacy to use as needed.  Please take Zofran  ODT as needed for nausea.  Over the next couple days, I would recommend that you slowly reintroduce foods back into your diet.  Follow-up brat diet (bananas, rice, applesauce, toast) and plenty of hydration  Return to emergency department for experiencing if no worsening symptoms, intractable vomiting

## 2023-09-05 ENCOUNTER — Emergency Department (HOSPITAL_COMMUNITY)
Admission: EM | Admit: 2023-09-05 | Discharge: 2023-09-05 | Disposition: A | Discharge status: Home / Self Care | Attending: Emergency Medicine | Admitting: Emergency Medicine

## 2023-09-05 ENCOUNTER — Other Ambulatory Visit: Payer: Self-pay

## 2023-09-05 ENCOUNTER — Encounter (HOSPITAL_COMMUNITY): Payer: Self-pay

## 2023-09-05 DIAGNOSIS — Z794 Long term (current) use of insulin: Secondary | ICD-10-CM | POA: Diagnosis not present

## 2023-09-05 DIAGNOSIS — E860 Dehydration: Secondary | ICD-10-CM | POA: Diagnosis not present

## 2023-09-05 DIAGNOSIS — E1143 Type 2 diabetes mellitus with diabetic autonomic (poly)neuropathy: Secondary | ICD-10-CM | POA: Diagnosis not present

## 2023-09-05 DIAGNOSIS — R1110 Vomiting, unspecified: Secondary | ICD-10-CM | POA: Diagnosis present

## 2023-09-05 LAB — URINALYSIS, ROUTINE W REFLEX MICROSCOPIC
Bilirubin Urine: NEGATIVE
Glucose, UA: 50 mg/dL — AB
Ketones, ur: 20 mg/dL — AB
Leukocytes,Ua: NEGATIVE
Nitrite: NEGATIVE
Protein, ur: 100 mg/dL — AB
Specific Gravity, Urine: 1.016 (ref 1.005–1.030)
pH: 5 (ref 5.0–8.0)

## 2023-09-05 LAB — CBC
HCT: 42.7 % (ref 36.0–46.0)
Hemoglobin: 14 g/dL (ref 12.0–15.0)
MCH: 30.8 pg (ref 26.0–34.0)
MCHC: 32.8 g/dL (ref 30.0–36.0)
MCV: 93.8 fL (ref 80.0–100.0)
Platelets: 333 10*3/uL (ref 150–400)
RBC: 4.55 MIL/uL (ref 3.87–5.11)
RDW: 12.6 % (ref 11.5–15.5)
WBC: 7.8 10*3/uL (ref 4.0–10.5)
nRBC: 0 % (ref 0.0–0.2)

## 2023-09-05 LAB — CBG MONITORING, ED: Glucose-Capillary: 182 mg/dL — ABNORMAL HIGH (ref 70–99)

## 2023-09-05 LAB — COMPREHENSIVE METABOLIC PANEL WITH GFR
ALT: 10 U/L (ref 0–44)
AST: 15 U/L (ref 15–41)
Albumin: 4 g/dL (ref 3.5–5.0)
Alkaline Phosphatase: 81 U/L (ref 38–126)
Anion gap: 10 (ref 5–15)
BUN: 13 mg/dL (ref 6–20)
CO2: 24 mmol/L (ref 22–32)
Calcium: 9.8 mg/dL (ref 8.9–10.3)
Chloride: 104 mmol/L (ref 98–111)
Creatinine, Ser: 1.22 mg/dL — ABNORMAL HIGH (ref 0.44–1.00)
GFR, Estimated: >60 mL/min (ref >60)
Glucose, Bld: 197 mg/dL — ABNORMAL HIGH (ref 70–99)
Potassium: 3.9 mmol/L (ref 3.5–5.1)
Sodium: 138 mmol/L (ref 135–145)
Total Bilirubin: 0.7 mg/dL (ref 0.0–1.2)
Total Protein: 7.8 g/dL (ref 6.5–8.1)

## 2023-09-05 LAB — I-STAT CHEM 8, ED
BUN: 12 mg/dL (ref 6–20)
Calcium, Ion: 1.25 mmol/L (ref 1.15–1.40)
Chloride: 105 mmol/L (ref 98–111)
Creatinine, Ser: 1.3 mg/dL — ABNORMAL HIGH (ref 0.44–1.00)
Glucose, Bld: 200 mg/dL — ABNORMAL HIGH (ref 70–99)
HCT: 45 % (ref 36.0–46.0)
Hemoglobin: 15.3 g/dL — ABNORMAL HIGH (ref 12.0–15.0)
Potassium: 3.9 mmol/L (ref 3.5–5.1)
Sodium: 139 mmol/L (ref 135–145)
TCO2: 23 mmol/L (ref 22–32)

## 2023-09-05 LAB — HCG, SERUM, QUALITATIVE: Preg, Serum: NEGATIVE

## 2023-09-05 LAB — BETA-HYDROXYBUTYRIC ACID: Beta-Hydroxybutyric Acid: 2.5 mmol/L — ABNORMAL HIGH (ref 0.05–0.27)

## 2023-09-05 LAB — LIPASE, BLOOD: Lipase: 21 U/L (ref 11–51)

## 2023-09-05 MED ORDER — SODIUM CHLORIDE 0.9 % IV BOLUS
1000.0000 mL | Freq: Once | INTRAVENOUS | Status: AC
  Administered 2023-09-05: 1000 mL via INTRAVENOUS

## 2023-09-05 MED ORDER — METOCLOPRAMIDE HCL 5 MG/ML IJ SOLN
10.0000 mg | Freq: Once | INTRAMUSCULAR | Status: AC
  Administered 2023-09-05: 10 mg via INTRAVENOUS
  Filled 2023-09-05: qty 2

## 2023-09-05 NOTE — ED Provider Notes (Signed)
  EMERGENCY DEPARTMENT AT [facility] Provider Note   CSN: [id number] Arrival date & time: 09/05/23  4098     History  Chief Complaint  Patient presents with   Emesis    [name] is a 33 y.o. female.  Patient with a history of diabetes and gastroparesis.  She presents with vomiting.  For a week.  She was seen in the emergency department and treated.  She improved some but never picked up her Reglan  that was prescribed  The history is provided by the patient and medical records. No language interpreter was used.  Emesis Severity:  Moderate Timing:  Intermittent Quality:  Bilious material Able to tolerate:  Liquids Progression:  Unchanged Chronicity:  Recurrent Recent urination:  Normal Relieved by:  Nothing Worsened by:  Nothing Ineffective treatments:  None tried Associated symptoms: no abdominal pain, no cough, no diarrhea and no headaches        Home Medications Prior to Admission medications   Medication Sig Start Date End Date Taking? Authorizing Provider  Albuterol  Sulfate (PROAIR  RESPICLICK) 108 (90 Base) MCG/ACT AEPB Inhale 1-2 puffs into the lungs every 6 (six) hours as needed. Patient taking differently: Inhale 2-3 puffs into the lungs as needed (cold symptoms). 09/22/19   Nche, Connye Delaine, NP  amitriptyline (ELAVIL) 25 MG tablet Take 50 mg by mouth at bedtime. 05/14/23 05/13/24  [provider]  Continuous Blood Gluc Sensor (FREESTYLE LIBRE 14 DAY SENSOR) MISC by Other route every fourteen (14) days. One libre sensor every 14 days 01/21/18   [provider]  etonogestrel (NEXPLANON) 68 MG IMPL implant 1 each by Subdermal route once. Implanted summer 2020    [provider]  Insulin  Aspart (NOVOLOG  IJ) Inject as directed. Inject before every meal and when blood sugar spikes per insulin  pump.    [provider]  Insulin  Disposable Pump (OMNIPOD 5 G6 POD, GEN 5,) MISC SMARTSIG:SUB-Q Every 3 Days     [provider]  Insulin  Pen Needle (ULTRA-THIN II SHORT PEN NEEDLE) 31G X 8 MM MISC Inject into the skin. 01/21/18   [provider]  metoCLOPramide  (REGLAN ) 10 MG tablet Take 1 tablet (10 mg total) by mouth every 8 (eight) hours as needed for up to 10 days for nausea. 09/03/23 09/13/23  Royann Cords, PA  ondansetron  (ZOFRAN -ODT) 8 MG disintegrating tablet Take 8 mg by mouth every 8 (eight) hours as needed.    [provider]  pantoprazole  (PROTONIX ) 40 MG tablet Take 1 tablet (40 mg total) by mouth daily as needed (heart burn). Patient taking differently: Take 40 mg by mouth daily. 08/05/21   Patel, Pranav M, MD  ranitidine (ZANTAC) 75 MG tablet Take 75 mg by mouth daily as needed for heartburn.  06/15/20  [provider]      Allergies    Aleve [naproxen sodium]    Review of Systems   Review of Systems  Constitutional:  Negative for appetite change and fatigue.  HENT:  Negative for congestion, ear discharge and sinus pressure.   Eyes:  Negative for discharge.  Respiratory:  Negative for cough.   Cardiovascular:  Negative for chest pain.  Gastrointestinal:  Positive for vomiting. Negative for abdominal pain and diarrhea.  Genitourinary:  Negative for frequency and hematuria.  Musculoskeletal:  Negative for back pain.  Skin:  Negative for rash.  Neurological:  Negative for seizures and headaches.  Psychiatric/Behavioral:  Negative for hallucinations.     Physical Exam Updated Vital Signs  BP (!) 158/102   Pulse (!) 113   Temp 98 F (36.7 C) (Oral)   Resp 20   SpO2 100%  Physical Exam Vitals and nursing note reviewed.  Constitutional:      Appearance: She is well-developed.  HENT:     Head: Normocephalic.     Nose: Nose normal.  Eyes:     General: No scleral icterus.    Conjunctiva/sclera: Conjunctivae normal.  Neck:     Thyroid : No thyromegaly.  Cardiovascular:     Rate and Rhythm: Normal rate and regular rhythm.     Heart sounds: No  murmur heard.    No friction rub. No gallop.  Pulmonary:     Breath sounds: No stridor. No wheezing or rales.  Chest:     Chest wall: No tenderness.  Abdominal:     General: There is no distension.     Tenderness: There is no abdominal tenderness. There is no rebound.  Musculoskeletal:        General: Normal range of motion.     Cervical back: Neck supple.  Lymphadenopathy:     Cervical: No cervical adenopathy.  Skin:    Findings: No erythema or rash.  Neurological:     Mental Status: She is alert and oriented to person, place, and time.     Motor: No abnormal muscle tone.     Coordination: Coordination normal.  Psychiatric:        Behavior: Behavior normal.     ED Results / Procedures / Treatments   Labs (all labs ordered are listed, but only abnormal results are displayed) Labs Reviewed  COMPREHENSIVE METABOLIC PANEL WITH GFR - Abnormal; Notable for the following components:      Result Value   Glucose, Bld 197 (*)    Creatinine, Ser 1.22 (*)    All other components within normal limits  BETA-HYDROXYBUTYRIC ACID - Abnormal; Notable for the following components:   Beta-Hydroxybutyric Acid 2.50 (*)    All other components within normal limits  CBG MONITORING, ED - Abnormal; Notable for the following components:   Glucose-Capillary 182 (*)    All other components within normal limits  I-STAT CHEM 8, ED - Abnormal; Notable for the following components:   Creatinine, Ser 1.30 (*)    Glucose, Bld 200 (*)    Hemoglobin 15.3 (*)    All other components within normal limits  LIPASE, BLOOD  CBC  HCG, SERUM, QUALITATIVE  URINALYSIS, ROUTINE W REFLEX MICROSCOPIC    EKG None  Radiology No results found.  Procedures Procedures    Medications Ordered in ED Medications  sodium chloride  0.9 % bolus 1,000 mL (1,000 mLs Intravenous New Bag/Given 09/05/23 0811)  metoCLOPramide  (REGLAN ) injection 10 mg (10 mg Intravenous Given 09/05/23 0812)  sodium chloride  0.9 % bolus  1,000 mL (1,000 mLs Intravenous New Bag/Given 09/05/23 1046)    ED Course/ Medical Decision Making/ A&P  Patient's vomiting improved with Zofran  and fluids.  Patient is requesting discharge home                               Medical Decision Making Amount and/or Complexity of Data Reviewed Labs: ordered.  Risk Prescription drug management.   Gastroparesis and diabetes with vomiting.  Patient is not in DKA.  She is discharged home with Reglan  and will follow-up as an outpatient        Final Clinical Impression(s) / ED Diagnoses Final diagnoses:  Uncontrollable vomiting  Dehydration    Rx / DC Orders ED Discharge Orders     None         Cheyenne Cotta, MD 09/06/23 1137

## 2023-09-05 NOTE — ED Triage Notes (Signed)
 Pt states she has been vomiting since last Tuesday. Pt states she is a diabetic. Denies any diarrhea. Unable to keep any food or drink down. Having abdominal pain with it.

## 2023-09-05 NOTE — Discharge Instructions (Addendum)
 Pick up your Reglan  and get a primary care doctor.  Return if not improving.  You have been referred to [facility] as the primary care

## 2023-09-23 DIAGNOSIS — K219 Gastro-esophageal reflux disease without esophagitis: Secondary | ICD-10-CM | POA: Diagnosis not present

## 2023-09-23 DIAGNOSIS — R1115 Cyclical vomiting syndrome unrelated to migraine: Secondary | ICD-10-CM | POA: Diagnosis not present

## 2023-09-23 DIAGNOSIS — K2950 Unspecified chronic gastritis without bleeding: Secondary | ICD-10-CM | POA: Diagnosis not present

## 2023-12-17 ENCOUNTER — Encounter (HOSPITAL_COMMUNITY): Payer: Self-pay

## 2023-12-17 ENCOUNTER — Emergency Department (HOSPITAL_COMMUNITY): Admission: EM | Admit: 2023-12-17 | Discharge: 2023-12-17 | Disposition: A | Discharge status: Home / Self Care

## 2023-12-17 ENCOUNTER — Other Ambulatory Visit: Payer: Self-pay

## 2023-12-17 ENCOUNTER — Emergency Department (HOSPITAL_COMMUNITY)

## 2023-12-17 DIAGNOSIS — E109 Type 1 diabetes mellitus without complications: Secondary | ICD-10-CM | POA: Diagnosis not present

## 2023-12-17 DIAGNOSIS — Y92410 Unspecified street and highway as the place of occurrence of the external cause: Secondary | ICD-10-CM | POA: Insufficient documentation

## 2023-12-17 DIAGNOSIS — Z794 Long term (current) use of insulin: Secondary | ICD-10-CM | POA: Diagnosis not present

## 2023-12-17 DIAGNOSIS — I10 Essential (primary) hypertension: Secondary | ICD-10-CM | POA: Diagnosis not present

## 2023-12-17 DIAGNOSIS — S4351XA Sprain of right acromioclavicular joint, initial encounter: Secondary | ICD-10-CM | POA: Insufficient documentation

## 2023-12-17 DIAGNOSIS — M25511 Pain in right shoulder: Secondary | ICD-10-CM | POA: Diagnosis present

## 2023-12-17 DIAGNOSIS — J45909 Unspecified asthma, uncomplicated: Secondary | ICD-10-CM | POA: Insufficient documentation

## 2023-12-17 MED ORDER — METHOCARBAMOL 500 MG PO TABS
500.0000 mg | ORAL_TABLET | Freq: Two times a day (BID) | ORAL | 0 refills | Status: AC
Start: 2023-12-17 — End: ?
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
## 2024-01-23 ENCOUNTER — Encounter (HOSPITAL_COMMUNITY): Payer: Self-pay

## 2024-01-23 ENCOUNTER — Other Ambulatory Visit: Payer: Self-pay

## 2024-01-23 ENCOUNTER — Emergency Department (HOSPITAL_COMMUNITY): Admission: EM | Admit: 2024-01-23 | Discharge: 2024-01-23 | Disposition: A | Discharge status: Home / Self Care

## 2024-01-23 DIAGNOSIS — Z794 Long term (current) use of insulin: Secondary | ICD-10-CM | POA: Diagnosis not present

## 2024-01-23 DIAGNOSIS — R112 Nausea with vomiting, unspecified: Secondary | ICD-10-CM | POA: Diagnosis present

## 2024-01-23 DIAGNOSIS — E119 Type 2 diabetes mellitus without complications: Secondary | ICD-10-CM | POA: Insufficient documentation

## 2024-01-23 DIAGNOSIS — K3184 Gastroparesis: Secondary | ICD-10-CM | POA: Insufficient documentation

## 2024-01-23 LAB — CBC WITH DIFFERENTIAL/PLATELET
Abs Immature Granulocytes: 0.04 K/uL (ref 0.00–0.07)
Basophils Absolute: 0.1 K/uL (ref 0.0–0.1)
Basophils Relative: 1 %
Eosinophils Absolute: 0.1 K/uL (ref 0.0–0.5)
Eosinophils Relative: 1 %
HCT: 40.2 % (ref 36.0–46.0)
Hemoglobin: 13.2 g/dL (ref 12.0–15.0)
Immature Granulocytes: 0 %
Lymphocytes Relative: 20 %
Lymphs Abs: 2.1 K/uL (ref 0.7–4.0)
MCH: 29.9 pg (ref 26.0–34.0)
MCHC: 32.8 g/dL (ref 30.0–36.0)
MCV: 91.2 fL (ref 80.0–100.0)
Monocytes Absolute: 0.7 K/uL (ref 0.1–1.0)
Monocytes Relative: 6 %
Neutro Abs: 7.7 K/uL (ref 1.7–7.7)
Neutrophils Relative %: 72 %
Platelets: 315 K/uL (ref 150–400)
RBC: 4.41 MIL/uL (ref 3.87–5.11)
RDW: 12.9 % (ref 11.5–15.5)
WBC: 10.6 K/uL — ABNORMAL HIGH (ref 4.0–10.5)
nRBC: 0 % (ref 0.0–0.2)

## 2024-01-23 LAB — URINALYSIS, ROUTINE W REFLEX MICROSCOPIC
Bacteria, UA: NONE SEEN
Bilirubin Urine: NEGATIVE
Glucose, UA: NEGATIVE mg/dL
Hgb urine dipstick: NEGATIVE
Ketones, ur: 20 mg/dL — AB
Leukocytes,Ua: NEGATIVE
Nitrite: NEGATIVE
Protein, ur: 100 mg/dL — AB
Specific Gravity, Urine: 1.015 (ref 1.005–1.030)
pH: 5 (ref 5.0–8.0)

## 2024-01-23 LAB — BASIC METABOLIC PANEL WITH GFR
Anion gap: 15 (ref 5–15)
BUN: 18 mg/dL (ref 6–20)
CO2: 22 mmol/L (ref 22–32)
Calcium: 10.3 mg/dL (ref 8.9–10.3)
Chloride: 104 mmol/L (ref 98–111)
Creatinine, Ser: 1.25 mg/dL — ABNORMAL HIGH (ref 0.44–1.00)
GFR, Estimated: 58 mL/min — ABNORMAL LOW (ref >60)
Glucose, Bld: 122 mg/dL — ABNORMAL HIGH (ref 70–99)
Potassium: 3.8 mmol/L (ref 3.5–5.1)
Sodium: 140 mmol/L (ref 135–145)

## 2024-01-23 LAB — HEPATIC FUNCTION PANEL
ALT: 7 U/L (ref 0–44)
AST: 19 U/L (ref 15–41)
Albumin: 4.5 g/dL (ref 3.5–5.0)
Alkaline Phosphatase: 94 U/L (ref 38–126)
Bilirubin, Direct: 0.2 mg/dL (ref 0.0–0.2)
Indirect Bilirubin: 0.4 mg/dL (ref 0.3–0.9)
Total Bilirubin: 0.7 mg/dL (ref 0.0–1.2)
Total Protein: 7.6 g/dL (ref 6.5–8.1)

## 2024-01-23 LAB — HCG, SERUM, QUALITATIVE: Preg, Serum: NEGATIVE

## 2024-01-23 LAB — CBG MONITORING, ED
Glucose-Capillary: 120 mg/dL — ABNORMAL HIGH (ref 70–99)
Glucose-Capillary: 124 mg/dL — ABNORMAL HIGH (ref 70–99)
Glucose-Capillary: 71 mg/dL (ref 70–99)
Glucose-Capillary: 98 mg/dL (ref 70–99)

## 2024-01-23 LAB — LIPASE, BLOOD: Lipase: <10 U/L — ABNORMAL LOW (ref 11–51)

## 2024-01-23 LAB — BETA-HYDROXYBUTYRIC ACID: Beta-Hydroxybutyric Acid: 1.99 mmol/L — ABNORMAL HIGH (ref 0.05–0.27)

## 2024-01-23 MED ORDER — ACETAMINOPHEN 325 MG PO TABS
650.0000 mg | ORAL_TABLET | Freq: Once | ORAL | Status: AC
  Administered 2024-01-23: 650 mg via ORAL
  Filled 2024-01-23: qty 2

## 2024-01-23 MED ORDER — SODIUM CHLORIDE 0.9 % IV BOLUS
1000.0000 mL | Freq: Once | INTRAVENOUS | Status: AC
  Administered 2024-01-23: 1000 mL via INTRAVENOUS

## 2024-01-23 MED ORDER — ONDANSETRON HCL 4 MG/2ML IJ SOLN
4.0000 mg | Freq: Once | INTRAMUSCULAR | Status: AC
  Administered 2024-01-23: 4 mg via INTRAVENOUS
  Filled 2024-01-23: qty 2

## 2024-01-23 NOTE — ED Provider Notes (Signed)
 [place] EMERGENCY DEPARTMENT AT [facility] Provider Note   CSN: [id number] Arrival date & time: 01/23/24  1034     Patient presents with: Abdominal Pain   [name] is a 33 y.o. female.   33 year old female presents for evaluation of nausea and vomiting and abdominal pain that started today.  She has a history of diabetes and psychosis.  She states her sugars have been good at home.  She states this has happened before and usually she needs fluids through the IV to help her.  Takes Zofran  and Reglan  at home but it has not helped.  Denies any other symptoms or concerns.   Abdominal Pain Associated symptoms: nausea and vomiting   Associated symptoms: no chest pain, no chills, no cough, no dysuria, no fever, no hematuria, no shortness of breath and no sore throat        Prior to Admission medications   Medication Sig Start Date End Date Taking? Authorizing Provider  Albuterol  Sulfate (PROAIR  RESPICLICK) 108 (90 Base) MCG/ACT AEPB Inhale 1-2 puffs into the lungs every 6 (six) hours as needed. Patient taking differently: Inhale 2-3 puffs into the lungs as needed (cold symptoms). 09/22/19   Nche, Roselie Rockford, NP  amitriptyline (ELAVIL) 25 MG tablet Take 50 mg by mouth at bedtime. 05/14/23 05/13/24  [provider]  Continuous Blood Gluc Sensor (FREESTYLE LIBRE 14 DAY SENSOR) MISC by Other route every fourteen (14) days. One libre sensor every 14 days 01/21/18   [provider]  etonogestrel (NEXPLANON) 68 MG IMPL implant 1 each by Subdermal route once. Implanted summer 2020    [provider]  Insulin  Aspart (NOVOLOG  IJ) Inject as directed. Inject before every meal and when blood sugar spikes per insulin  pump.    [provider]  Insulin  Disposable Pump (OMNIPOD 5 G6 POD, GEN 5,) MISC SMARTSIG:SUB-Q Every 3 Days    [provider]  Insulin  Pen Needle (ULTRA-THIN II SHORT PEN NEEDLE) 31G X 8 MM MISC Inject into the skin.  01/21/18   [provider]  methocarbamol  (ROBAXIN ) 500 MG tablet Take 1 tablet (500 mg total) by mouth 2 (two) times daily. 12/17/23   Bauer, Collin S, PA-C  metoCLOPramide  (REGLAN ) 10 MG tablet Take 1 tablet (10 mg total) by mouth every 8 (eight) hours as needed for up to 10 days for nausea. 09/03/23 09/13/23  Minnie Tinnie BRAVO, PA  ondansetron  (ZOFRAN -ODT) 8 MG disintegrating tablet Take 8 mg by mouth every 8 (eight) hours as needed.    [provider]  pantoprazole  (PROTONIX ) 40 MG tablet Take 1 tablet (40 mg total) by mouth daily as needed (heart burn). Patient taking differently: Take 40 mg by mouth daily. 08/05/21   Patel, Pranav M, MD  ranitidine (ZANTAC) 75 MG tablet Take 75 mg by mouth daily as needed for heartburn.  06/15/20  [provider]    Allergies: Aleve [naproxen sodium]    Review of Systems  Constitutional:  Negative for chills and fever.  HENT:  Negative for ear pain and sore throat.   Eyes:  Negative for pain and visual disturbance.  Respiratory:  Negative for cough and shortness of breath.   Cardiovascular:  Negative for chest pain and palpitations.  Gastrointestinal:  Positive for abdominal pain, nausea and vomiting.  Genitourinary:  Negative for dysuria and hematuria.  Musculoskeletal:  Negative for arthralgias and back pain.  Skin:  Negative for color change and rash.  Neurological:  Negative for seizures and syncope.  All other systems reviewed and are negative.   Updated Vital Signs BP (!) 160/99 (BP Location: Right Arm)   Pulse (!) 104   Temp 98.4 F (36.9 C) (Oral)   Resp 16   Ht 5' 4 (1.626 m)   Wt 54.4 kg   SpO2 100%   BMI 20.60 kg/m   Physical Exam Vitals and nursing note reviewed.  Constitutional:      General: She is not in acute distress.    Appearance: She is well-developed. She is not ill-appearing.  HENT:     Head: Normocephalic and atraumatic.  Eyes:     Conjunctiva/sclera: Conjunctivae normal.  Cardiovascular:      Rate and Rhythm: Regular rhythm. Tachycardia present.     Heart sounds: No murmur heard. Pulmonary:     Effort: Pulmonary effort is normal. No respiratory distress.     Breath sounds: Normal breath sounds.  Abdominal:     Palpations: Abdomen is soft.     Tenderness: There is generalized abdominal tenderness.  Musculoskeletal:        General: No swelling.     Cervical back: Neck supple.  Skin:    General: Skin is warm and dry.     Capillary Refill: Capillary refill takes less than 2 seconds.  Neurological:     Mental Status: She is alert.  Psychiatric:        Mood and Affect: Mood normal.     (all labs ordered are listed, but only abnormal results are displayed) Labs Reviewed  LIPASE, BLOOD - Abnormal; Notable for the following components:      Result Value   Lipase <10 (*)    All other components within normal limits  CBC WITH DIFFERENTIAL/PLATELET - Abnormal; Notable for the following components:   WBC 10.6 (*)    All other components within normal limits  URINALYSIS, ROUTINE W REFLEX MICROSCOPIC - Abnormal; Notable for the following components:   Ketones, ur 20 (*)    Protein, ur 100 (*)    All other components within normal limits  BASIC METABOLIC PANEL WITH GFR - Abnormal; Notable for the following components:   Glucose, Bld 122 (*)    Creatinine, Ser 1.25 (*)    GFR, Estimated 58 (*)    All other components within normal limits  CBG MONITORING, ED - Abnormal; Notable for the following components:   Glucose-Capillary 120 (*)    All other components within normal limits  CBG MONITORING, ED - Abnormal; Notable for the following components:   Glucose-Capillary 124 (*)    All other components within normal limits  HCG, SERUM, QUALITATIVE  HEPATIC FUNCTION PANEL  BETA-HYDROXYBUTYRIC ACID  CBG MONITORING, ED  CBG MONITORING, ED    EKG: EKG Interpretation Date/Time:  Thursday January 23 2024 11:15:33 EDT Ventricular Rate:  112 PR Interval:  154 QRS  Duration:  80 QT Interval:  313 QTC Calculation: 428 R Axis:   -78  Text Interpretation: Sinus tachycardia Left anterior fascicular block Baseline wander Nonspecific ST changes Compared with prior EKG from 09/03/2023 Confirmed by Gennaro Bouchard ([id number]) on 01/23/2024 11:22:38 AM  Radiology: No results found.   Procedures   Medications Ordered in the ED  sodium chloride  0.9 % bolus 1,000 mL (0 mLs Intravenous Stopped 01/23/24 1503)  ondansetron  (ZOFRAN ) injection 4 mg (4 mg Intravenous Given 01/23/24 1147)  acetaminophen  (TYLENOL ) tablet 650 mg (650 mg Oral Given 01/23/24 1510)  Medical Decision Making Cardiac monitor interpretation: Shows sinus tachycardia, rate improved from 150s to 100s, there is no ectopy  Patient's lab workup reviewed by me and unremarkable.  Glucose is stable.  Patient was able to eat and drink without difficulty after Zofran  IV fluids and Reglan .  She is overall feeling much improved.  Advised to stay on her medications as prescribed.  Advise close up with primary care and otherwise return to the ER for new or worsening symptoms.  She feels comfortable to plan be discharged home.  Problems Addressed: Gastroparesis: acute illness or injury Nausea and vomiting, unspecified vomiting type: acute illness or injury  Amount and/or Complexity of Data Reviewed External Data Reviewed: notes.    Details: Prior ED records reviewed: Patient seen multiple times in the ER previously for similar symptoms Labs: ordered. Decision-making details documented in ED Course.    Details: Ordered and reviewed by me and fairly unremarkable, glucose is stable ECG/medicine tests: ordered and independent interpretation performed. Decision-making details documented in ED Course.    Details: Ordered and interpreted by me in the absence of cardiology and shows sinus tachycardia, no STEMI or acute change when compared to prior  Risk OTC drugs. Prescription  drug management. Drug therapy requiring intensive monitoring for toxicity.    Final diagnoses:  Nausea and vomiting, unspecified vomiting type  Gastroparesis    ED Discharge Orders     None          Gennaro Duwaine CROME, DO 01/23/24 1544

## 2024-01-23 NOTE — Discharge Instructions (Signed)
 Take your zofran  and reglan  as needed for nausea and vomiting. Keep a bland/clear liquid diet for the next 24 hours. Stay on your other medications as prescribed.

## 2024-01-23 NOTE — ED Triage Notes (Addendum)
 Pt reports with abdominal pain and vomiting x 1 week. Pt reports that her glucose levels have been good at home.

## 2024-02-26 ENCOUNTER — Ambulatory Visit (INDEPENDENT_AMBULATORY_CARE_PROVIDER_SITE_OTHER): Admitting: Podiatry

## 2024-02-26 DIAGNOSIS — Z91199 Patient's noncompliance with other medical treatment and regimen due to unspecified reason: Secondary | ICD-10-CM

## 2024-02-26 NOTE — Progress Notes (Signed)
 No show
